# Patient Record
Sex: Male | Born: 1935 | Race: White | Hispanic: No | Smoking: Former smoker
Health system: Southern US, Community
[De-identification: ages and names within clinical notes are randomized; demographics above are authoritative.]

## PROBLEM LIST (undated history)

## (undated) DIAGNOSIS — I509 Heart failure, unspecified: Secondary | ICD-10-CM

## (undated) DIAGNOSIS — N189 Chronic kidney disease, unspecified: Secondary | ICD-10-CM

## (undated) DIAGNOSIS — D649 Anemia, unspecified: Secondary | ICD-10-CM

## (undated) DIAGNOSIS — E119 Type 2 diabetes mellitus without complications: Secondary | ICD-10-CM

## (undated) DIAGNOSIS — H409 Unspecified glaucoma: Secondary | ICD-10-CM

## (undated) DIAGNOSIS — I4891 Unspecified atrial fibrillation: Secondary | ICD-10-CM

## (undated) DIAGNOSIS — E785 Hyperlipidemia, unspecified: Secondary | ICD-10-CM

## (undated) DIAGNOSIS — K219 Gastro-esophageal reflux disease without esophagitis: Secondary | ICD-10-CM

## (undated) DIAGNOSIS — I10 Essential (primary) hypertension: Secondary | ICD-10-CM

## (undated) DIAGNOSIS — C679 Malignant neoplasm of bladder, unspecified: Secondary | ICD-10-CM

## (undated) DIAGNOSIS — M1990 Unspecified osteoarthritis, unspecified site: Secondary | ICD-10-CM

## (undated) DIAGNOSIS — I2510 Atherosclerotic heart disease of native coronary artery without angina pectoris: Secondary | ICD-10-CM

## (undated) DIAGNOSIS — I501 Left ventricular failure: Secondary | ICD-10-CM

## (undated) DIAGNOSIS — G4730 Sleep apnea, unspecified: Secondary | ICD-10-CM

## (undated) HISTORY — DX: Atherosclerotic heart disease of native coronary artery without angina pectoris: I25.10

## (undated) HISTORY — PX: AORTIC VALVE REPLACEMENT: SHX41

## (undated) HISTORY — PX: KNEE ARTHROSCOPY: SUR90

## (undated) HISTORY — DX: Unspecified glaucoma: H40.9

## (undated) HISTORY — DX: Type 2 diabetes mellitus without complications: E11.9

## (undated) HISTORY — PX: OTHER SURGICAL HISTORY: SHX169

## (undated) HISTORY — DX: Left ventricular failure, unspecified: I50.1

## (undated) HISTORY — DX: Gastro-esophageal reflux disease without esophagitis: K21.9

## (undated) HISTORY — DX: Anemia, unspecified: D64.9

## (undated) HISTORY — DX: Sleep apnea, unspecified: G47.30

## (undated) HISTORY — DX: Unspecified osteoarthritis, unspecified site: M19.90

## (undated) HISTORY — DX: Malignant neoplasm of bladder, unspecified: C67.9

## (undated) HISTORY — DX: Hyperlipidemia, unspecified: E78.5

## (undated) HISTORY — DX: Essential (primary) hypertension: I10

## (undated) HISTORY — DX: Heart failure, unspecified: I50.9

## (undated) HISTORY — DX: Unspecified atrial fibrillation: I48.91

## (undated) HISTORY — DX: Chronic kidney disease, unspecified: N18.9

---

## 2004-08-11 ENCOUNTER — Ambulatory Visit: Payer: Self-pay | Admitting: Unknown Physician Specialty

## 2004-08-26 ENCOUNTER — Ambulatory Visit: Payer: Self-pay | Admitting: Unknown Physician Specialty

## 2005-07-11 ENCOUNTER — Ambulatory Visit: Payer: Self-pay | Admitting: Family Medicine

## 2005-08-01 ENCOUNTER — Other Ambulatory Visit: Payer: Self-pay

## 2005-08-11 ENCOUNTER — Inpatient Hospital Stay: Payer: Self-pay | Admitting: General Practice

## 2005-08-16 ENCOUNTER — Ambulatory Visit: Payer: Self-pay | Admitting: Family Medicine

## 2005-09-05 ENCOUNTER — Ambulatory Visit: Payer: Self-pay | Admitting: Family Medicine

## 2005-09-14 ENCOUNTER — Other Ambulatory Visit: Payer: Self-pay

## 2005-09-14 ENCOUNTER — Emergency Department: Payer: Self-pay | Admitting: Emergency Medicine

## 2006-11-30 ENCOUNTER — Ambulatory Visit: Payer: Self-pay | Admitting: General Practice

## 2007-01-22 ENCOUNTER — Ambulatory Visit: Payer: Self-pay | Admitting: Pain Medicine

## 2007-02-06 ENCOUNTER — Ambulatory Visit: Payer: Self-pay | Admitting: Pain Medicine

## 2007-03-19 ENCOUNTER — Ambulatory Visit: Payer: Self-pay | Admitting: Pain Medicine

## 2007-04-01 ENCOUNTER — Ambulatory Visit: Payer: Self-pay | Admitting: Pain Medicine

## 2007-05-30 ENCOUNTER — Ambulatory Visit: Payer: Self-pay | Admitting: Pain Medicine

## 2007-06-19 ENCOUNTER — Ambulatory Visit: Payer: Self-pay | Admitting: Pain Medicine

## 2007-07-25 ENCOUNTER — Ambulatory Visit: Payer: Self-pay | Admitting: Family Medicine

## 2007-08-08 ENCOUNTER — Ambulatory Visit: Payer: Self-pay | Admitting: Family Medicine

## 2007-10-09 ENCOUNTER — Encounter: Payer: Self-pay | Admitting: Cardiology

## 2007-11-06 ENCOUNTER — Encounter: Payer: Self-pay | Admitting: Cardiology

## 2007-12-06 ENCOUNTER — Encounter: Payer: Self-pay | Admitting: Cardiology

## 2008-01-06 ENCOUNTER — Encounter: Payer: Self-pay | Admitting: Cardiology

## 2008-05-27 ENCOUNTER — Ambulatory Visit: Payer: Self-pay | Admitting: Family Medicine

## 2008-08-18 ENCOUNTER — Emergency Department: Payer: Self-pay | Admitting: Unknown Physician Specialty

## 2008-11-28 ENCOUNTER — Inpatient Hospital Stay: Payer: Self-pay | Admitting: *Deleted

## 2009-01-29 ENCOUNTER — Emergency Department: Payer: Self-pay | Admitting: Internal Medicine

## 2009-02-02 ENCOUNTER — Ambulatory Visit: Payer: Self-pay

## 2009-02-11 ENCOUNTER — Ambulatory Visit: Payer: Self-pay | Admitting: General Practice

## 2009-03-17 ENCOUNTER — Ambulatory Visit: Payer: Self-pay | Admitting: Cardiovascular Disease

## 2009-03-17 ENCOUNTER — Ambulatory Visit: Payer: Self-pay | Admitting: General Practice

## 2009-03-31 ENCOUNTER — Inpatient Hospital Stay: Payer: Self-pay | Admitting: General Practice

## 2009-05-26 ENCOUNTER — Ambulatory Visit: Payer: Self-pay | Admitting: General Practice

## 2009-06-09 ENCOUNTER — Inpatient Hospital Stay: Payer: Self-pay | Admitting: *Deleted

## 2009-06-23 ENCOUNTER — Ambulatory Visit: Payer: Self-pay | Admitting: General Practice

## 2009-06-28 ENCOUNTER — Inpatient Hospital Stay: Payer: Self-pay | Admitting: General Practice

## 2009-10-05 ENCOUNTER — Ambulatory Visit: Payer: Self-pay | Admitting: Oncology

## 2009-10-14 ENCOUNTER — Ambulatory Visit: Payer: Self-pay | Admitting: Oncology

## 2009-11-05 ENCOUNTER — Ambulatory Visit: Payer: Self-pay | Admitting: Oncology

## 2009-12-05 ENCOUNTER — Ambulatory Visit: Payer: Self-pay | Admitting: Oncology

## 2009-12-14 ENCOUNTER — Ambulatory Visit: Payer: Self-pay | Admitting: Oncology

## 2010-01-05 ENCOUNTER — Ambulatory Visit: Payer: Self-pay | Admitting: Oncology

## 2010-01-13 ENCOUNTER — Ambulatory Visit: Payer: Self-pay | Admitting: Pain Medicine

## 2010-01-24 ENCOUNTER — Inpatient Hospital Stay: Payer: Self-pay | Admitting: Cardiology

## 2010-02-17 ENCOUNTER — Ambulatory Visit: Payer: Self-pay | Admitting: Pain Medicine

## 2010-02-21 ENCOUNTER — Ambulatory Visit: Payer: Self-pay | Admitting: Pain Medicine

## 2010-03-16 ENCOUNTER — Ambulatory Visit: Payer: Self-pay | Admitting: Oncology

## 2010-03-22 ENCOUNTER — Ambulatory Visit: Payer: Self-pay | Admitting: Pain Medicine

## 2010-03-28 ENCOUNTER — Ambulatory Visit: Payer: Self-pay | Admitting: Pain Medicine

## 2010-04-07 ENCOUNTER — Ambulatory Visit: Payer: Self-pay | Admitting: Oncology

## 2010-04-12 IMAGING — CR DG KNEE 1-2V*R*
1 series · 2 of 2 positions shown · non-contrast
Comparison: none

REASON FOR EXAM: postop
COMMENTS:   Bedside (portable):Y

PROCEDURE:     DXR - DXR KNEE RIGHT AP AND LATERAL  - March 31, 2009  [DATE]
RESULT:     The patient is status post right knee arthroplasty. The hardware
is different than seen on the study of 01/29/2009. Surgical drains and skin
staples are present. Methylmethacrylate is present.

[Series 1: view not recorded · 0.17mm/px · 2 of 2 slices shown]
[im 1/2]
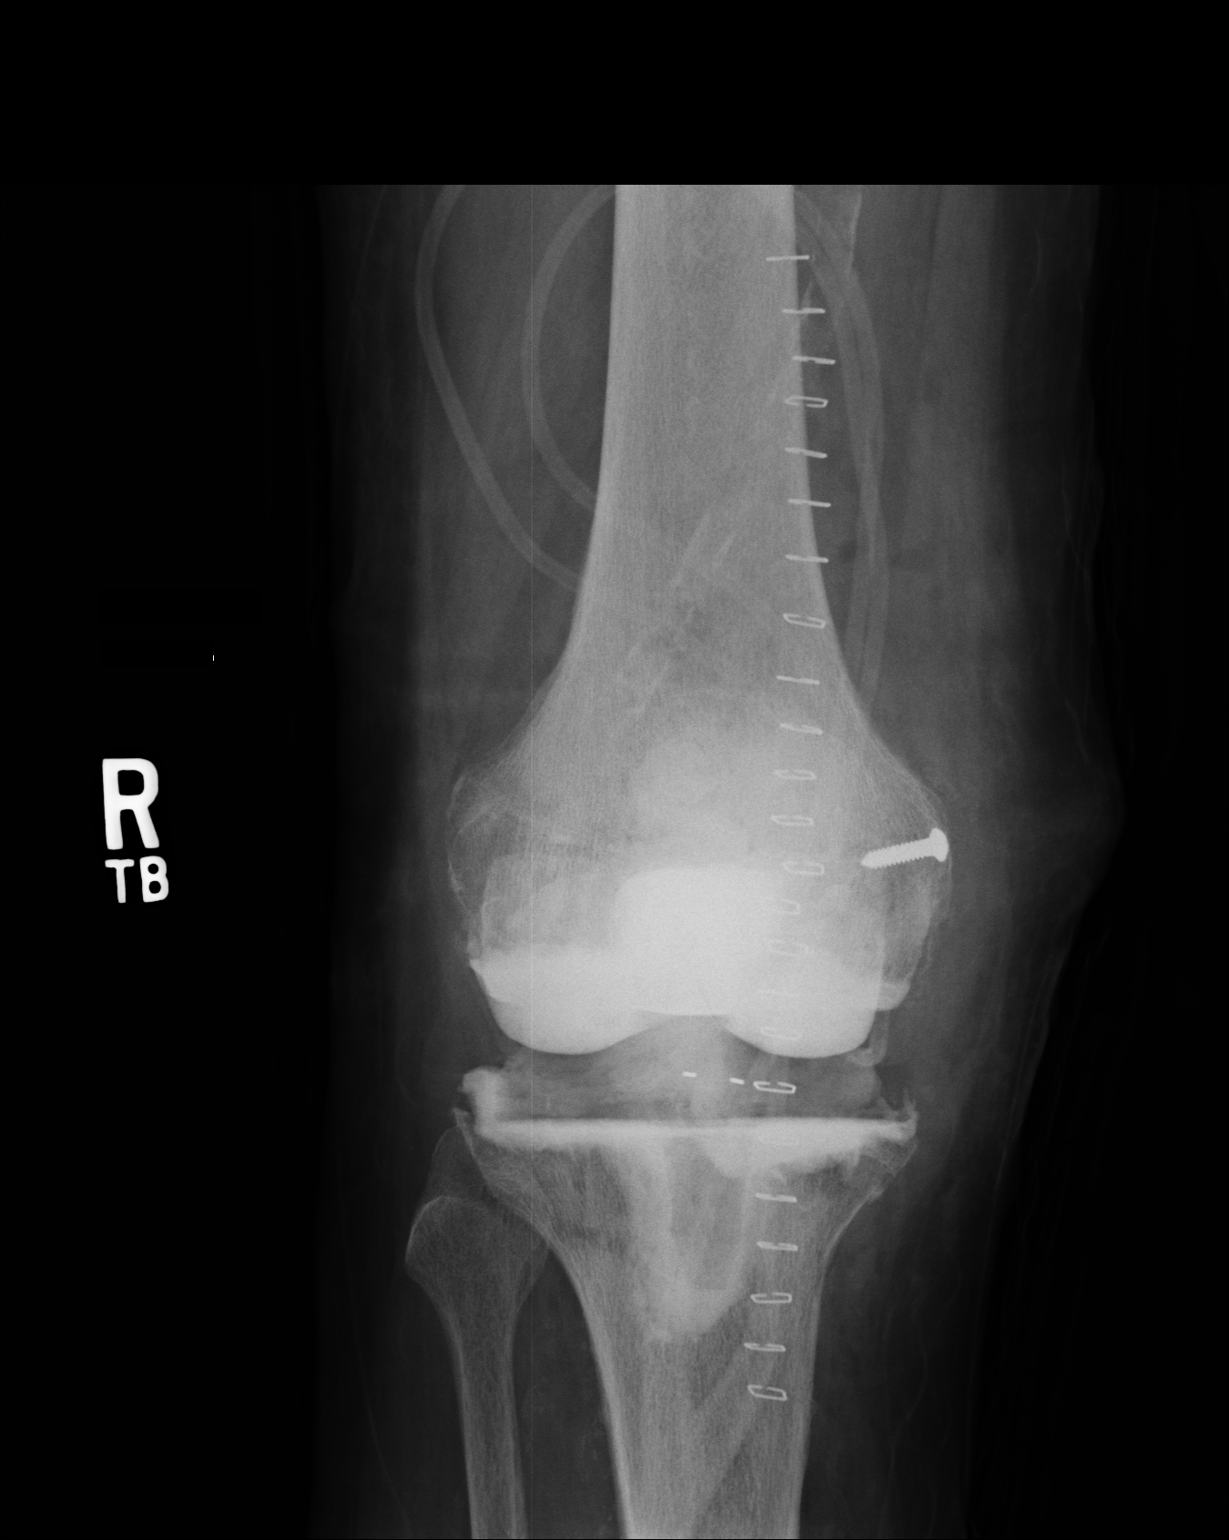
[im 2/2]
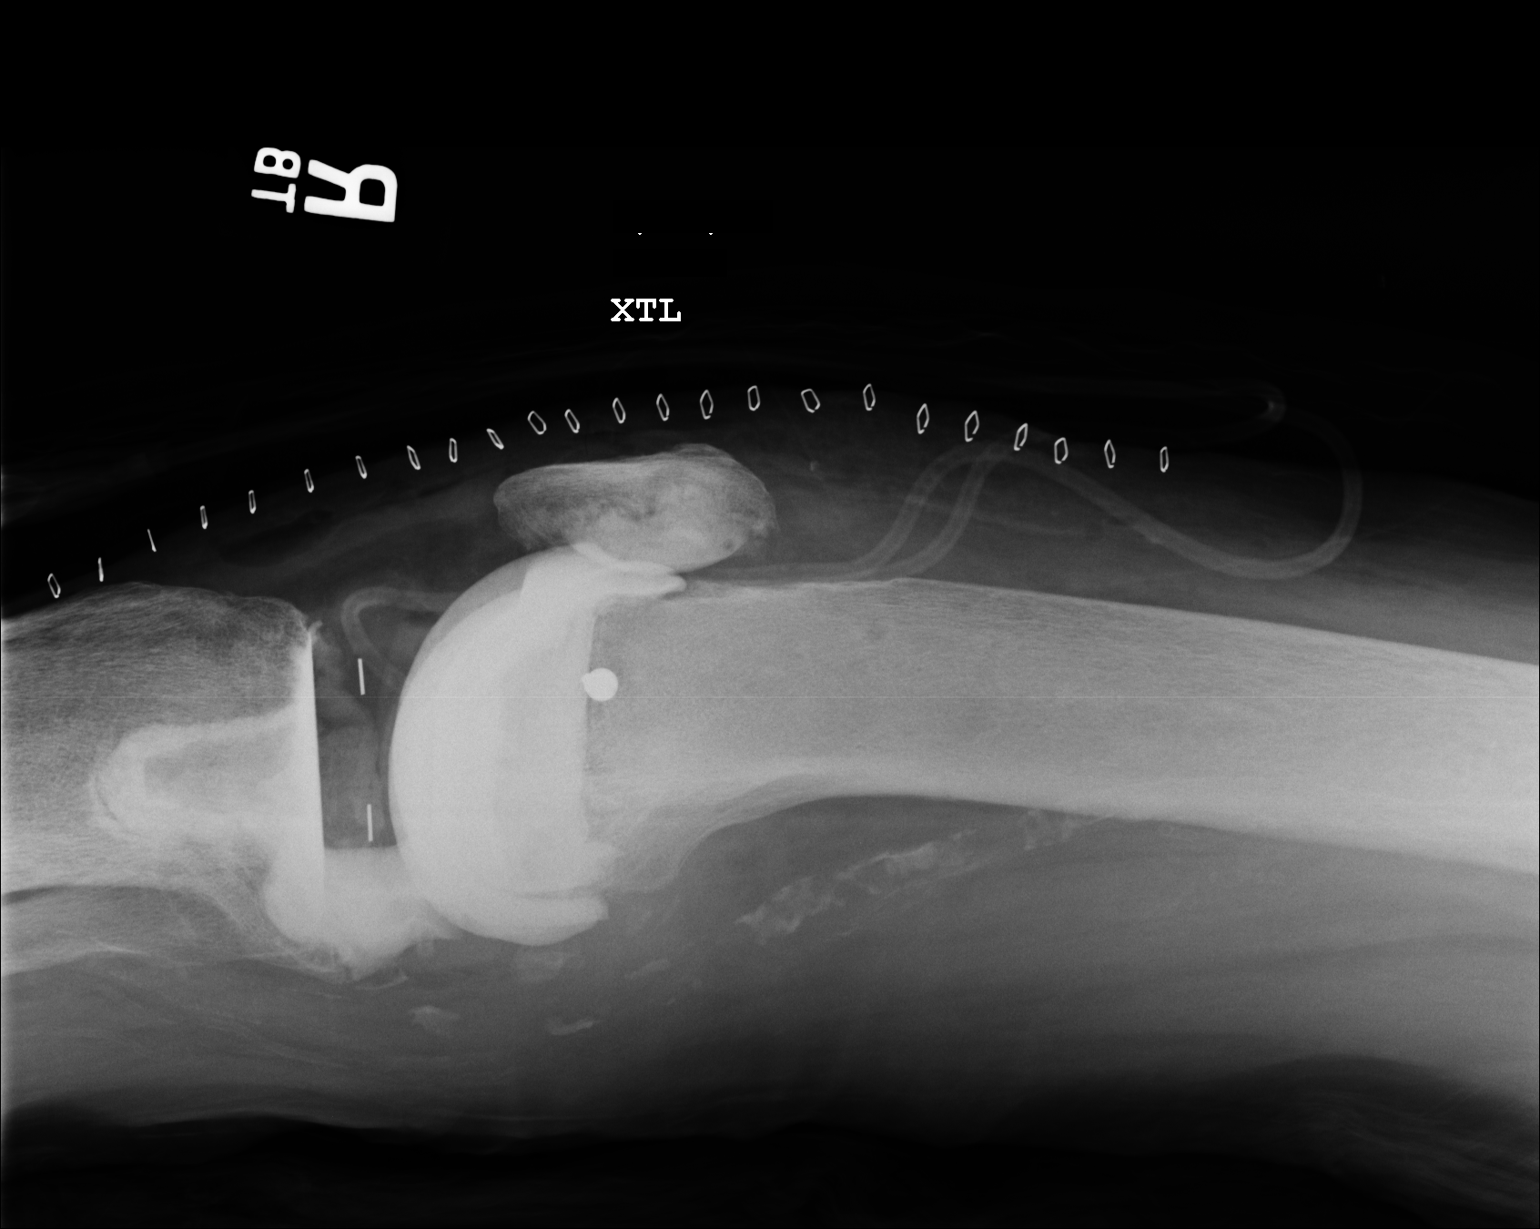

[2 of 2 positions shown; findings below may reference images not displayed]

IMPRESSION: Postoperative changes as described. Hardware revision has
occurred.

## 2010-04-28 ENCOUNTER — Ambulatory Visit: Payer: Self-pay | Admitting: Pain Medicine

## 2010-05-02 ENCOUNTER — Ambulatory Visit: Payer: Self-pay | Admitting: Pain Medicine

## 2010-05-09 ENCOUNTER — Ambulatory Visit: Payer: Self-pay | Admitting: Pain Medicine

## 2010-06-09 ENCOUNTER — Ambulatory Visit: Payer: Self-pay | Admitting: Pain Medicine

## 2010-06-16 ENCOUNTER — Ambulatory Visit: Payer: Self-pay | Admitting: Oncology

## 2010-06-20 ENCOUNTER — Other Ambulatory Visit: Payer: Self-pay | Admitting: Pain Medicine

## 2010-06-20 ENCOUNTER — Ambulatory Visit: Payer: Self-pay | Admitting: Pain Medicine

## 2010-07-07 ENCOUNTER — Ambulatory Visit: Payer: Self-pay | Admitting: Oncology

## 2010-07-10 IMAGING — CR DG KNEE 1-2V*R*
1 series · 5 of 5 positions shown · non-contrast
Comparison: none

REASON FOR EXAM: postop
COMMENTS:   Bedside (portable):Y

PROCEDURE:     DXR - DXR KNEE RIGHT AP AND LATERAL  - June 28, 2009  [DATE]
RESULT:     Comparison: 03/31/2009

[Series 1: view not recorded · 0.17mm/px · 5 of 5 slices shown]
[im 1/5]
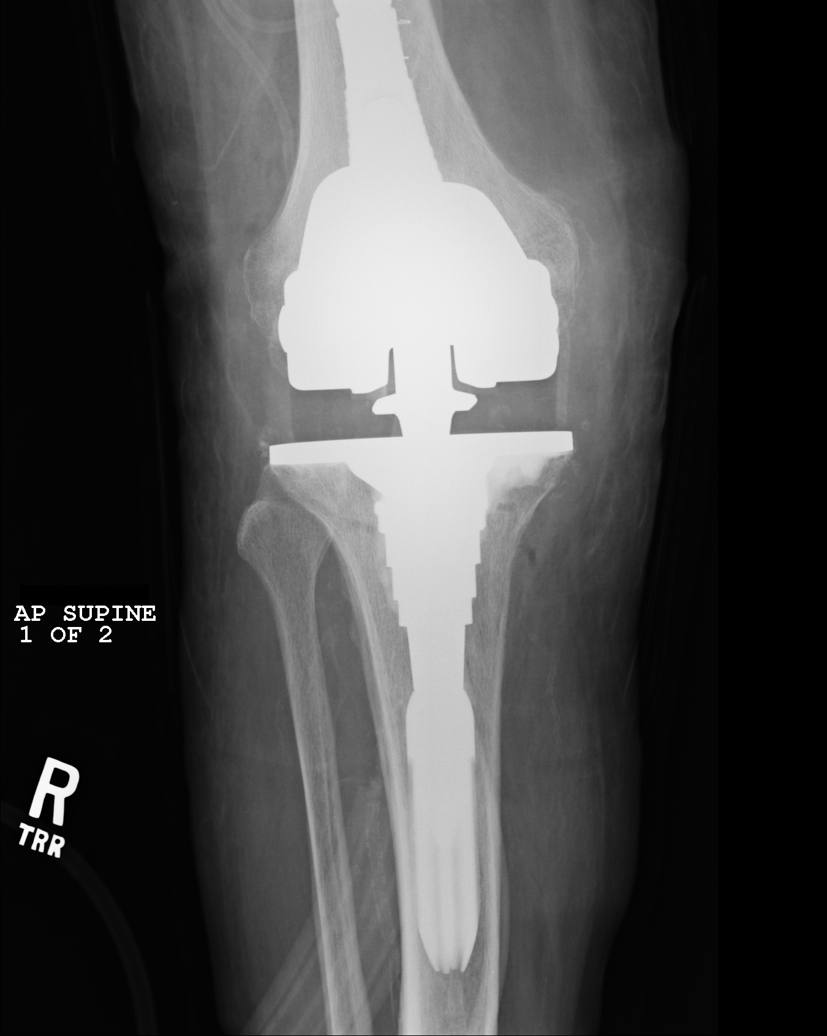
[im 2/5]
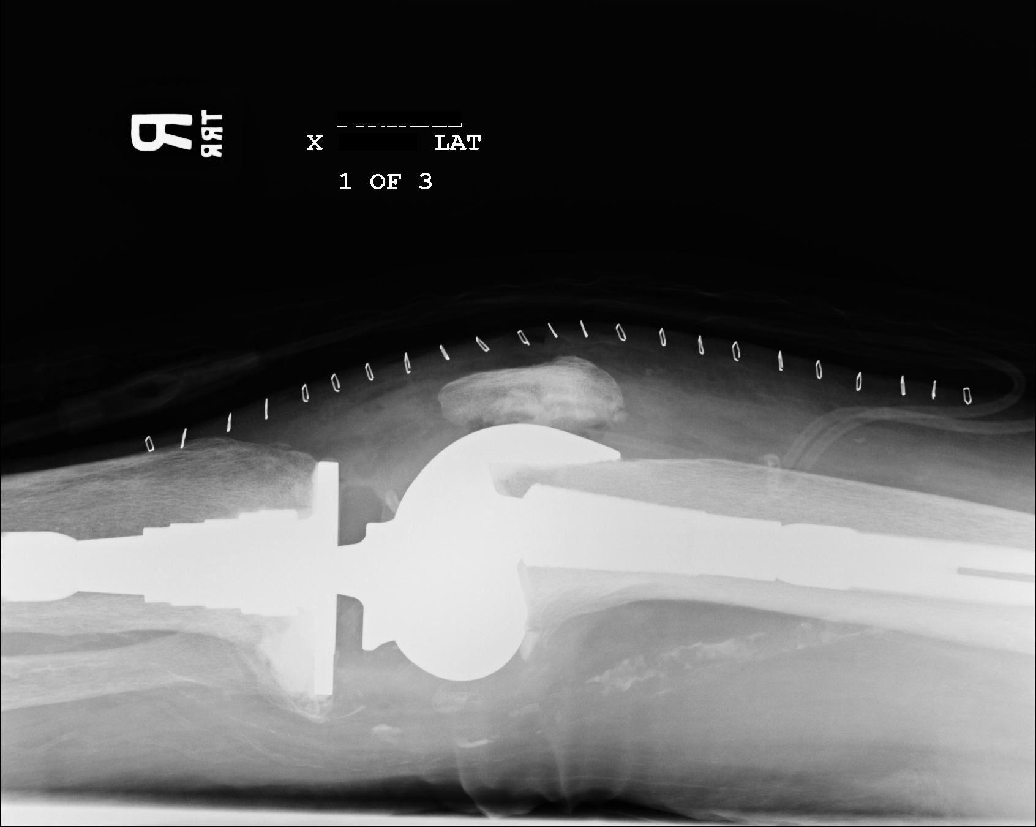
[im 3/5]
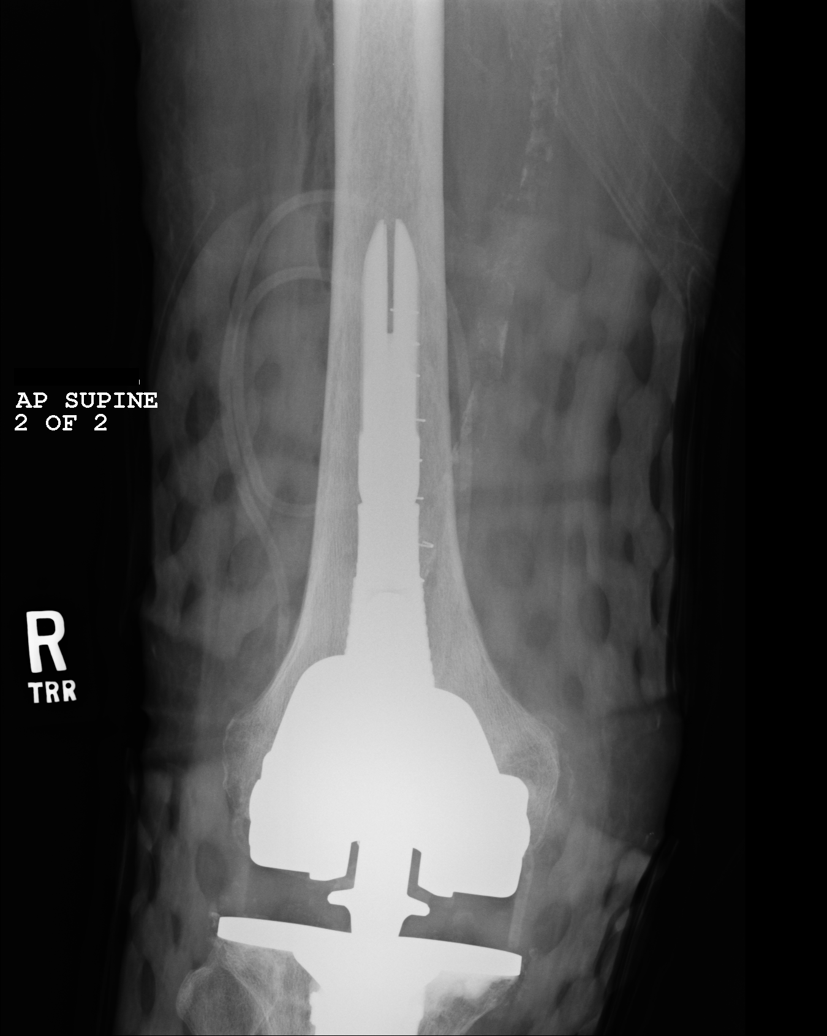
[im 4/5]
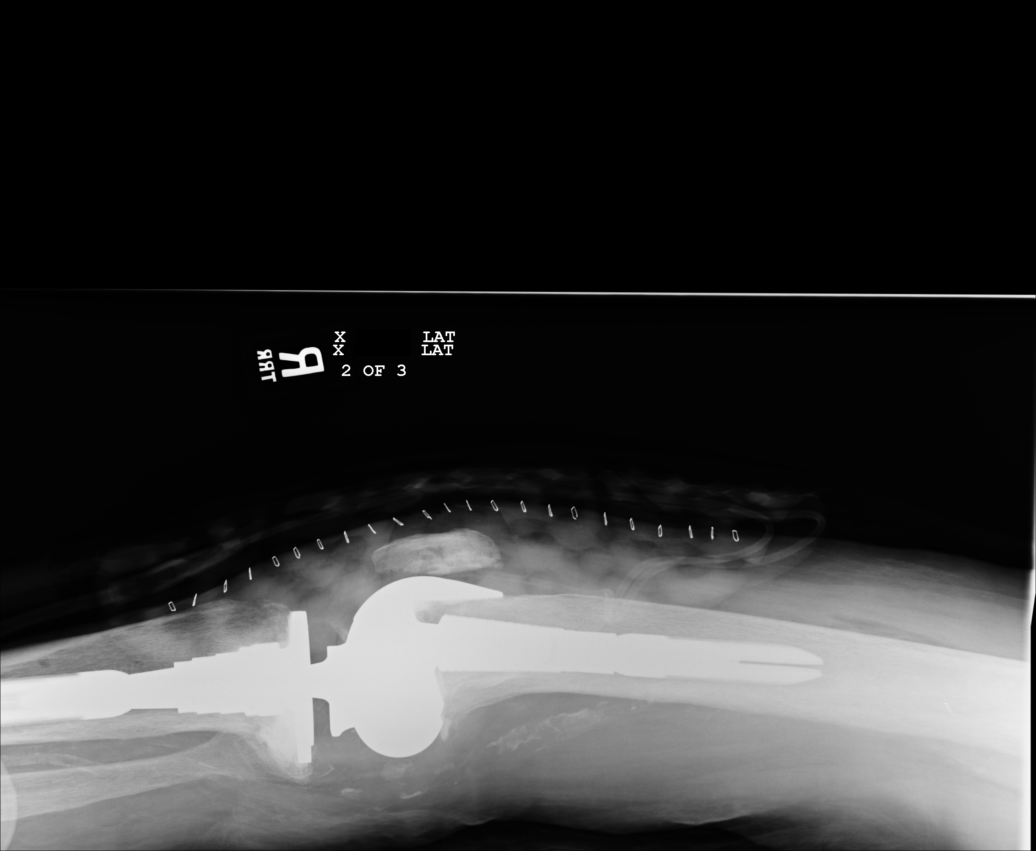
[im 5/5]
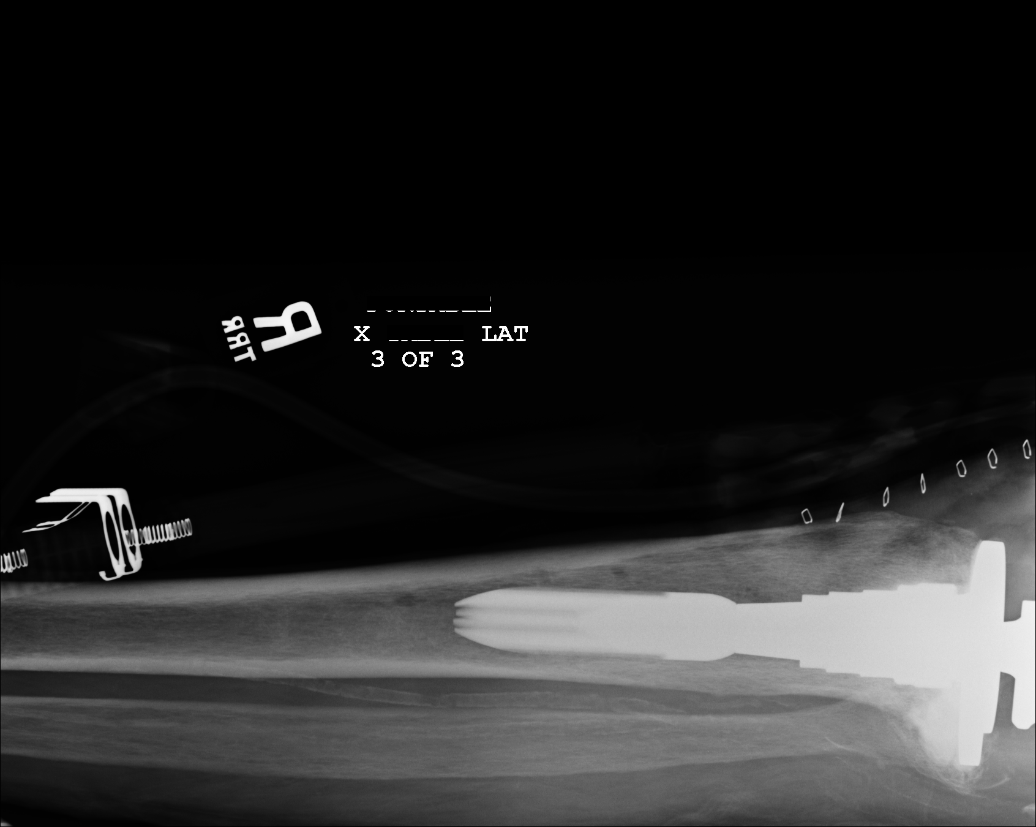

[5 of 5 positions shown; findings below may reference images not displayed]

FINDINGS: Five views of the right knee demonstrates a revision right total knee
arthroplasty without hardware failure or complication. There is no fracture
or dislocation. The alignment is anatomic. There is no significant joint
effusion. There are postsurgical changes within the anterior knee soft
tissues.
IMPRESSION: Revision right knee arthroplasty.

## 2010-07-26 ENCOUNTER — Ambulatory Visit: Payer: Self-pay | Admitting: Pain Medicine

## 2010-08-30 ENCOUNTER — Ambulatory Visit: Payer: Self-pay | Admitting: Pain Medicine

## 2010-09-15 ENCOUNTER — Ambulatory Visit: Payer: Self-pay | Admitting: Oncology

## 2010-09-29 ENCOUNTER — Ambulatory Visit: Payer: Self-pay | Admitting: Pain Medicine

## 2010-10-06 ENCOUNTER — Ambulatory Visit: Payer: Self-pay | Admitting: Oncology

## 2010-10-12 ENCOUNTER — Ambulatory Visit: Payer: Self-pay | Admitting: Pain Medicine

## 2010-10-12 ENCOUNTER — Other Ambulatory Visit: Payer: Self-pay | Admitting: Pain Medicine

## 2010-11-15 ENCOUNTER — Ambulatory Visit: Payer: Self-pay | Admitting: Pain Medicine

## 2010-12-15 ENCOUNTER — Ambulatory Visit: Payer: Self-pay | Admitting: Pain Medicine

## 2010-12-15 ENCOUNTER — Ambulatory Visit: Payer: Self-pay | Admitting: Oncology

## 2011-01-06 ENCOUNTER — Ambulatory Visit: Payer: Self-pay | Admitting: Oncology

## 2011-01-12 ENCOUNTER — Ambulatory Visit: Payer: Self-pay | Admitting: Pain Medicine

## 2011-01-25 ENCOUNTER — Ambulatory Visit: Payer: Self-pay | Admitting: Pain Medicine

## 2011-03-22 ENCOUNTER — Ambulatory Visit: Payer: Self-pay | Admitting: Oncology

## 2011-04-08 ENCOUNTER — Ambulatory Visit: Payer: Self-pay | Admitting: Oncology

## 2011-04-26 ENCOUNTER — Observation Stay: Payer: Self-pay | Admitting: Internal Medicine

## 2011-04-27 ENCOUNTER — Ambulatory Visit: Payer: Self-pay | Admitting: Cardiology

## 2011-06-23 ENCOUNTER — Ambulatory Visit: Payer: Self-pay | Admitting: Oncology

## 2011-07-08 ENCOUNTER — Ambulatory Visit: Payer: Self-pay | Admitting: Oncology

## 2011-09-13 ENCOUNTER — Ambulatory Visit: Payer: Self-pay | Admitting: Oncology

## 2011-09-15 LAB — CBC CANCER CENTER
Basophil %: 0.4 %
Eosinophil #: 0.1 x10 3/mm (ref 0.0–0.7)
HGB: 11.7 g/dL — ABNORMAL LOW (ref 13.0–18.0)
Lymphocyte %: 30.9 %
MCH: 33.1 pg (ref 26.0–34.0)
MCHC: 33.7 g/dL (ref 32.0–36.0)
MCV: 98 fL (ref 80–100)
Neutrophil %: 51.9 %
Platelet: 122 x10 3/mm — ABNORMAL LOW (ref 150–440)
RBC: 3.53 10*6/uL — ABNORMAL LOW (ref 4.40–5.90)
WBC: 4.1 x10 3/mm (ref 3.8–10.6)

## 2011-09-15 LAB — FERRITIN: Ferritin (ARMC): 212 ng/mL (ref 8–388)

## 2011-09-15 LAB — IRON AND TIBC: Iron Saturation: 34 %

## 2011-10-06 ENCOUNTER — Ambulatory Visit: Payer: Self-pay | Admitting: Oncology

## 2011-12-08 ENCOUNTER — Ambulatory Visit: Payer: Self-pay | Admitting: Oncology

## 2011-12-08 LAB — CBC CANCER CENTER
HCT: 34.5 % — ABNORMAL LOW (ref 40.0–52.0)
HGB: 11.3 g/dL — ABNORMAL LOW (ref 13.0–18.0)
MCH: 33.6 pg (ref 26.0–34.0)
MCHC: 32.6 g/dL (ref 32.0–36.0)
Monocyte #: 0.6 x10 3/mm (ref 0.2–1.0)
Monocyte %: 13 %
Neutrophil #: 2.4 x10 3/mm (ref 1.4–6.5)
Platelet: 128 x10 3/mm — ABNORMAL LOW (ref 150–440)
RBC: 3.36 10*6/uL — ABNORMAL LOW (ref 4.40–5.90)

## 2012-01-06 ENCOUNTER — Ambulatory Visit: Payer: Self-pay | Admitting: Oncology

## 2012-06-06 ENCOUNTER — Ambulatory Visit: Payer: Self-pay | Admitting: Unknown Physician Specialty

## 2012-06-21 ENCOUNTER — Ambulatory Visit: Payer: Self-pay | Admitting: Oncology

## 2012-06-21 LAB — CBC CANCER CENTER
Basophil #: 0 x10 3/mm (ref 0.0–0.1)
Basophil %: 0.3 %
Eosinophil #: 0.2 x10 3/mm (ref 0.0–0.7)
HCT: 33.2 % — ABNORMAL LOW (ref 40.0–52.0)
HGB: 10.7 g/dL — ABNORMAL LOW (ref 13.0–18.0)
Lymphocyte #: 1.2 x10 3/mm (ref 1.0–3.6)
MCHC: 32.4 g/dL (ref 32.0–36.0)
MCV: 102 fL — ABNORMAL HIGH (ref 80–100)
Monocyte %: 12.5 %
Neutrophil #: 2.6 x10 3/mm (ref 1.4–6.5)
RDW: 14.2 % (ref 11.5–14.5)

## 2012-07-07 ENCOUNTER — Ambulatory Visit: Payer: Self-pay | Admitting: Oncology

## 2012-12-18 ENCOUNTER — Ambulatory Visit: Payer: Self-pay | Admitting: Oncology

## 2013-02-10 ENCOUNTER — Ambulatory Visit: Payer: Self-pay | Admitting: Oncology

## 2013-02-11 LAB — CBC CANCER CENTER
HCT: 28.8 % — ABNORMAL LOW (ref 40.0–52.0)
HGB: 9.7 g/dL — ABNORMAL LOW (ref 13.0–18.0)
Lymphocyte #: 0.8 x10 3/mm — ABNORMAL LOW (ref 1.0–3.6)
Lymphocyte %: 19.9 %
MCHC: 33.7 g/dL (ref 32.0–36.0)
MCV: 101 fL — ABNORMAL HIGH (ref 80–100)
Monocyte #: 0.6 x10 3/mm (ref 0.2–1.0)
Monocyte %: 13.6 %
Neutrophil #: 2.6 x10 3/mm (ref 1.4–6.5)
Neutrophil %: 63.1 %
Platelet: 93 x10 3/mm — ABNORMAL LOW (ref 150–440)
RDW: 15.4 % — ABNORMAL HIGH (ref 11.5–14.5)

## 2013-02-24 LAB — IRON AND TIBC
Iron: 91 ug/dL (ref 65–175)
Unbound Iron-Bind.Cap.: 232 ug/dL

## 2013-03-04 ENCOUNTER — Inpatient Hospital Stay: Payer: Self-pay | Admitting: Internal Medicine

## 2013-03-04 LAB — COMPREHENSIVE METABOLIC PANEL
Albumin: 3.8 g/dL (ref 3.4–5.0)
Alkaline Phosphatase: 83 U/L (ref 50–136)
BUN: 42 mg/dL — ABNORMAL HIGH (ref 7–18)
Bilirubin,Total: 0.8 mg/dL (ref 0.2–1.0)
Chloride: 106 mmol/L (ref 98–107)
Creatinine: 2.1 mg/dL — ABNORMAL HIGH (ref 0.60–1.30)
EGFR (Non-African Amer.): 27 — ABNORMAL LOW
Glucose: 119 mg/dL — ABNORMAL HIGH (ref 65–99)
Osmolality: 287 (ref 275–301)
Potassium: 4.5 mmol/L (ref 3.5–5.1)
SGOT(AST): 27 U/L (ref 15–37)
SGPT (ALT): 20 U/L (ref 12–78)
Total Protein: 8 g/dL (ref 6.4–8.2)

## 2013-03-04 LAB — CK TOTAL AND CKMB (NOT AT ARMC)
CK, Total: 86 U/L (ref 35–232)
CK-MB: 0.6 ng/mL (ref 0.5–3.6)

## 2013-03-04 LAB — URINALYSIS, COMPLETE
Bacteria: NONE SEEN
Leukocyte Esterase: NEGATIVE
Nitrite: NEGATIVE
Protein: 30
Squamous Epithelial: <1
WBC UR: NONE SEEN /HPF (ref 0–5)

## 2013-03-04 LAB — CBC
HCT: 30.2 % — ABNORMAL LOW (ref 40.0–52.0)
HGB: 9.8 g/dL — ABNORMAL LOW (ref 13.0–18.0)
MCH: 32.5 pg (ref 26.0–34.0)
MCV: 100 fL (ref 80–100)
RBC: 3.03 10*6/uL — ABNORMAL LOW (ref 4.40–5.90)
RDW: 15.1 % — ABNORMAL HIGH (ref 11.5–14.5)

## 2013-03-04 LAB — TROPONIN I
Troponin-I: 0.12 ng/mL — ABNORMAL HIGH
Troponin-I: 0.13 ng/mL — ABNORMAL HIGH

## 2013-03-04 LAB — MAGNESIUM: Magnesium: 1.6 mg/dL — ABNORMAL LOW

## 2013-03-04 LAB — PROTIME-INR: Prothrombin Time: 24.1 secs — ABNORMAL HIGH (ref 11.5–14.7)

## 2013-03-05 LAB — BASIC METABOLIC PANEL
BUN: 38 mg/dL — ABNORMAL HIGH (ref 7–18)
Chloride: 108 mmol/L — ABNORMAL HIGH (ref 98–107)
Co2: 26 mmol/L (ref 21–32)
EGFR (African American): 35 — ABNORMAL LOW
EGFR (Non-African Amer.): 30 — ABNORMAL LOW
Glucose: 88 mg/dL (ref 65–99)

## 2013-03-05 LAB — CBC WITH DIFFERENTIAL/PLATELET
Basophil #: 0 10*3/uL (ref 0.0–0.1)
Basophil %: 0.3 %
Eosinophil #: 0.1 10*3/uL (ref 0.0–0.7)
HCT: 27.9 % — ABNORMAL LOW (ref 40.0–52.0)
HGB: 9.5 g/dL — ABNORMAL LOW (ref 13.0–18.0)
Lymphocyte #: 0.9 10*3/uL — ABNORMAL LOW (ref 1.0–3.6)
Lymphocyte %: 24.5 %
MCH: 33.9 pg (ref 26.0–34.0)
Neutrophil #: 2.1 10*3/uL (ref 1.4–6.5)
Neutrophil %: 58.6 %
Platelet: 70 10*3/uL — ABNORMAL LOW (ref 150–440)
RBC: 2.79 10*6/uL — ABNORMAL LOW (ref 4.40–5.90)
RDW: 15.3 % — ABNORMAL HIGH (ref 11.5–14.5)
WBC: 3.7 10*3/uL — ABNORMAL LOW (ref 3.8–10.6)

## 2013-03-05 LAB — PROTIME-INR
INR: 2.4
Prothrombin Time: 25.5 secs — ABNORMAL HIGH (ref 11.5–14.7)

## 2013-03-05 LAB — CK TOTAL AND CKMB (NOT AT ARMC): CK-MB: 0.9 ng/mL (ref 0.5–3.6)

## 2013-03-07 ENCOUNTER — Ambulatory Visit: Payer: Self-pay | Admitting: Oncology

## 2013-04-14 ENCOUNTER — Ambulatory Visit: Payer: Self-pay | Admitting: Oncology

## 2013-04-14 LAB — CBC CANCER CENTER
Basophil #: 0 x10 3/mm (ref 0.0–0.1)
Eosinophil #: 0.1 x10 3/mm (ref 0.0–0.7)
Eosinophil %: 1.7 %
HCT: 35.3 % — ABNORMAL LOW (ref 40.0–52.0)
Lymphocyte #: 1.4 x10 3/mm (ref 1.0–3.6)
Lymphocyte %: 32.1 %
MCH: 33.3 pg (ref 26.0–34.0)
MCHC: 33.3 g/dL (ref 32.0–36.0)
Monocyte #: 0.6 x10 3/mm (ref 0.2–1.0)
Monocyte %: 14.3 %
Neutrophil #: 2.3 x10 3/mm (ref 1.4–6.5)
Neutrophil %: 51.5 %
Platelet: 118 x10 3/mm — ABNORMAL LOW (ref 150–440)

## 2013-04-14 LAB — IRON AND TIBC
Iron Bind.Cap.(Total): 335 ug/dL (ref 250–450)
Iron: 90 ug/dL (ref 65–175)

## 2013-04-14 LAB — BASIC METABOLIC PANEL
BUN: 52 mg/dL — ABNORMAL HIGH (ref 7–18)
Chloride: 103 mmol/L (ref 98–107)
Co2: 26 mmol/L (ref 21–32)
Glucose: 130 mg/dL — ABNORMAL HIGH (ref 65–99)
Osmolality: 295 (ref 275–301)
Potassium: 4.5 mmol/L (ref 3.5–5.1)
Sodium: 140 mmol/L (ref 136–145)

## 2013-04-14 LAB — FERRITIN: Ferritin (ARMC): 242 ng/mL (ref 8–388)

## 2013-05-07 ENCOUNTER — Ambulatory Visit: Payer: Self-pay | Admitting: Oncology

## 2013-07-24 ENCOUNTER — Ambulatory Visit: Payer: Self-pay | Admitting: Oncology

## 2013-07-24 LAB — CBC CANCER CENTER
Basophil #: 0 x10 3/mm (ref 0.0–0.1)
Basophil %: 0.5 %
Eosinophil %: 1 %
HGB: 10.4 g/dL — ABNORMAL LOW (ref 13.0–18.0)
Lymphocyte #: 1.5 x10 3/mm (ref 1.0–3.6)
Lymphocyte %: 27.3 %
MCH: 32.9 pg (ref 26.0–34.0)
MCV: 101 fL — ABNORMAL HIGH (ref 80–100)
Monocyte #: 0.6 x10 3/mm (ref 0.2–1.0)
Monocyte %: 11.7 %
Neutrophil #: 3.2 x10 3/mm (ref 1.4–6.5)
Platelet: 110 x10 3/mm — ABNORMAL LOW (ref 150–440)
RBC: 3.15 10*6/uL — ABNORMAL LOW (ref 4.40–5.90)
WBC: 5.4 x10 3/mm (ref 3.8–10.6)

## 2013-08-07 ENCOUNTER — Ambulatory Visit: Payer: Self-pay | Admitting: Oncology

## 2013-08-21 ENCOUNTER — Ambulatory Visit: Payer: Self-pay | Admitting: Physical Medicine and Rehabilitation

## 2013-10-20 ENCOUNTER — Ambulatory Visit: Payer: Self-pay | Admitting: Cardiology

## 2013-11-05 ENCOUNTER — Ambulatory Visit: Payer: Self-pay | Admitting: Cardiology

## 2013-12-11 ENCOUNTER — Ambulatory Visit: Payer: Self-pay | Admitting: Oncology

## 2013-12-11 LAB — CBC CANCER CENTER
BASOS PCT: 0.5 %
Basophil #: 0 x10 3/mm (ref 0.0–0.1)
EOS PCT: 1.6 %
Eosinophil #: 0.1 x10 3/mm (ref 0.0–0.7)
HCT: 32 % — ABNORMAL LOW (ref 40.0–52.0)
HGB: 10.6 g/dL — AB (ref 13.0–18.0)
LYMPHS ABS: 1.3 x10 3/mm (ref 1.0–3.6)
LYMPHS PCT: 26.8 %
MCH: 33.7 pg (ref 26.0–34.0)
MCHC: 33.1 g/dL (ref 32.0–36.0)
MCV: 102 fL — ABNORMAL HIGH (ref 80–100)
MONOS PCT: 10.8 %
Monocyte #: 0.5 x10 3/mm (ref 0.2–1.0)
Neutrophil #: 2.8 x10 3/mm (ref 1.4–6.5)
Neutrophil %: 60.3 %
Platelet: 97 x10 3/mm — ABNORMAL LOW (ref 150–440)
RBC: 3.14 10*6/uL — AB (ref 4.40–5.90)
RDW: 14.5 % (ref 11.5–14.5)
WBC: 4.7 x10 3/mm (ref 3.8–10.6)

## 2014-01-05 ENCOUNTER — Ambulatory Visit: Payer: Self-pay | Admitting: Oncology

## 2014-03-13 ENCOUNTER — Emergency Department: Payer: Self-pay | Admitting: Emergency Medicine

## 2014-05-07 ENCOUNTER — Inpatient Hospital Stay: Payer: Self-pay | Admitting: Internal Medicine

## 2014-05-07 ENCOUNTER — Ambulatory Visit: Payer: Self-pay | Admitting: Oncology

## 2014-05-07 LAB — COMPREHENSIVE METABOLIC PANEL
ALBUMIN: 3.4 g/dL (ref 3.4–5.0)
ANION GAP: 6 — AB (ref 7–16)
Alkaline Phosphatase: 93 U/L
BUN: 51 mg/dL — ABNORMAL HIGH (ref 7–18)
Bilirubin,Total: 0.5 mg/dL (ref 0.2–1.0)
CHLORIDE: 107 mmol/L (ref 98–107)
Calcium, Total: 9 mg/dL (ref 8.5–10.1)
Co2: 24 mmol/L (ref 21–32)
Creatinine: 2.12 mg/dL — ABNORMAL HIGH (ref 0.60–1.30)
EGFR (Non-African Amer.): 31 — ABNORMAL LOW
GFR CALC AF AMER: 38 — AB
GLUCOSE: 135 mg/dL — AB (ref 65–99)
OSMOLALITY: 290 (ref 275–301)
Potassium: 5.4 mmol/L — ABNORMAL HIGH (ref 3.5–5.1)
SGOT(AST): 29 U/L (ref 15–37)
SGPT (ALT): 29 U/L
Sodium: 137 mmol/L (ref 136–145)
Total Protein: 7.7 g/dL (ref 6.4–8.2)

## 2014-05-07 LAB — CBC
HCT: 30.6 % — ABNORMAL LOW (ref 40.0–52.0)
HGB: 9.8 g/dL — ABNORMAL LOW (ref 13.0–18.0)
MCH: 32.6 pg (ref 26.0–34.0)
MCHC: 32 g/dL (ref 32.0–36.0)
MCV: 102 fL — ABNORMAL HIGH (ref 80–100)
Platelet: 70 10*3/uL — ABNORMAL LOW (ref 150–440)
RBC: 3 10*6/uL — ABNORMAL LOW (ref 4.40–5.90)
RDW: 14.7 % — AB (ref 11.5–14.5)
WBC: 5.3 10*3/uL (ref 3.8–10.6)

## 2014-05-07 LAB — URINALYSIS, COMPLETE
BLOOD: NEGATIVE
Bacteria: NONE SEEN
Bilirubin,UR: NEGATIVE
GLUCOSE, UR: NEGATIVE mg/dL (ref 0–75)
KETONE: NEGATIVE
LEUKOCYTE ESTERASE: NEGATIVE
Nitrite: NEGATIVE
Ph: 5 (ref 4.5–8.0)
Protein: NEGATIVE
RBC,UR: <1 /HPF (ref 0–5)
SPECIFIC GRAVITY: 1.01 (ref 1.003–1.030)
SQUAMOUS EPITHELIAL: NONE SEEN
WBC UR: <1 /HPF (ref 0–5)

## 2014-05-07 LAB — PROTIME-INR
INR: 2.6
Prothrombin Time: 26.9 secs — ABNORMAL HIGH (ref 11.5–14.7)

## 2014-05-08 LAB — CBC WITH DIFFERENTIAL/PLATELET
Basophil #: 0 10*3/uL (ref 0.0–0.1)
Basophil %: 0.3 %
EOS ABS: 0.1 10*3/uL (ref 0.0–0.7)
Eosinophil %: 1.7 %
HCT: 29.4 % — AB (ref 40.0–52.0)
HGB: 9.2 g/dL — AB (ref 13.0–18.0)
LYMPHS ABS: 0.9 10*3/uL — AB (ref 1.0–3.6)
Lymphocyte %: 17.1 %
MCH: 32.3 pg (ref 26.0–34.0)
MCHC: 31.3 g/dL — ABNORMAL LOW (ref 32.0–36.0)
MCV: 103 fL — ABNORMAL HIGH (ref 80–100)
Monocyte #: 0.8 x10 3/mm (ref 0.2–1.0)
Monocyte %: 14.1 %
Neutrophil #: 3.6 10*3/uL (ref 1.4–6.5)
Neutrophil %: 66.8 %
Platelet: 64 10*3/uL — ABNORMAL LOW (ref 150–440)
RBC: 2.85 10*6/uL — AB (ref 4.40–5.90)
RDW: 14.8 % — ABNORMAL HIGH (ref 11.5–14.5)
WBC: 5.4 10*3/uL (ref 3.8–10.6)

## 2014-05-08 LAB — BASIC METABOLIC PANEL
ANION GAP: 6 — AB (ref 7–16)
BUN: 40 mg/dL — ABNORMAL HIGH (ref 7–18)
CALCIUM: 8.8 mg/dL (ref 8.5–10.1)
Chloride: 110 mmol/L — ABNORMAL HIGH (ref 98–107)
Co2: 24 mmol/L (ref 21–32)
Creatinine: 2 mg/dL — ABNORMAL HIGH (ref 0.60–1.30)
EGFR (African American): 40 — ABNORMAL LOW
EGFR (Non-African Amer.): 33 — ABNORMAL LOW
Glucose: 109 mg/dL — ABNORMAL HIGH (ref 65–99)
OSMOLALITY: 290 (ref 275–301)
POTASSIUM: 4.8 mmol/L (ref 3.5–5.1)
SODIUM: 140 mmol/L (ref 136–145)

## 2014-05-08 LAB — PROTIME-INR
INR: 2.9
Prothrombin Time: 29.8 secs — ABNORMAL HIGH (ref 11.5–14.7)

## 2014-05-08 LAB — HEMOGLOBIN A1C: HEMOGLOBIN A1C: 6.6 % — AB (ref 4.2–6.3)

## 2014-05-08 LAB — TSH: Thyroid Stimulating Horm: 2.27 u[IU]/mL

## 2014-05-08 LAB — MAGNESIUM: MAGNESIUM: 1.7 mg/dL — AB

## 2014-05-09 LAB — PROTIME-INR
INR: 2.9
Prothrombin Time: 29.5 secs — ABNORMAL HIGH (ref 11.5–14.7)

## 2014-05-10 LAB — PROTIME-INR
INR: 1.8
Prothrombin Time: 20.9 secs — ABNORMAL HIGH (ref 11.5–14.7)

## 2014-05-11 LAB — CBC WITH DIFFERENTIAL/PLATELET
Basophil #: 0 10*3/uL (ref 0.0–0.1)
Basophil %: 0.3 %
Eosinophil #: 0 10*3/uL (ref 0.0–0.7)
Eosinophil %: 0.6 %
HCT: 26.3 % — AB (ref 40.0–52.0)
HGB: 8.3 g/dL — ABNORMAL LOW (ref 13.0–18.0)
Lymphocyte #: 0.5 10*3/uL — ABNORMAL LOW (ref 1.0–3.6)
Lymphocyte %: 7.6 %
MCH: 32.4 pg (ref 26.0–34.0)
MCHC: 31.7 g/dL — AB (ref 32.0–36.0)
MCV: 102 fL — AB (ref 80–100)
MONO ABS: 0.8 x10 3/mm (ref 0.2–1.0)
MONOS PCT: 11.7 %
NEUTROS ABS: 5.1 10*3/uL (ref 1.4–6.5)
Neutrophil %: 79.8 %
PLATELETS: 77 10*3/uL — AB (ref 150–440)
RBC: 2.57 10*6/uL — AB (ref 4.40–5.90)
RDW: 14.8 % — ABNORMAL HIGH (ref 11.5–14.5)
WBC: 6.4 10*3/uL (ref 3.8–10.6)

## 2014-05-11 LAB — BASIC METABOLIC PANEL
Anion Gap: 6 — ABNORMAL LOW (ref 7–16)
BUN: 64 mg/dL — AB (ref 7–18)
CHLORIDE: 105 mmol/L (ref 98–107)
CO2: 25 mmol/L (ref 21–32)
CREATININE: 2.43 mg/dL — AB (ref 0.60–1.30)
Calcium, Total: 8.8 mg/dL (ref 8.5–10.1)
EGFR (African American): 32 — ABNORMAL LOW
EGFR (Non-African Amer.): 27 — ABNORMAL LOW
Glucose: 132 mg/dL — ABNORMAL HIGH (ref 65–99)
Osmolality: 292 (ref 275–301)
POTASSIUM: 4.7 mmol/L (ref 3.5–5.1)
SODIUM: 136 mmol/L (ref 136–145)

## 2014-05-11 LAB — PROTIME-INR
INR: 1.5
Prothrombin Time: 18 secs — ABNORMAL HIGH (ref 11.5–14.7)

## 2014-05-12 LAB — CBC WITH DIFFERENTIAL/PLATELET
Basophil #: 0 10*3/uL (ref 0.0–0.1)
Basophil %: 0.2 %
EOS ABS: 0.1 10*3/uL (ref 0.0–0.7)
EOS PCT: 1.5 %
HCT: 23.9 % — ABNORMAL LOW (ref 40.0–52.0)
HGB: 7.6 g/dL — ABNORMAL LOW (ref 13.0–18.0)
LYMPHS ABS: 0.8 10*3/uL — AB (ref 1.0–3.6)
Lymphocyte %: 12.2 %
MCH: 32.6 pg (ref 26.0–34.0)
MCHC: 31.9 g/dL — ABNORMAL LOW (ref 32.0–36.0)
MCV: 102 fL — AB (ref 80–100)
MONOS PCT: 14.4 %
Monocyte #: 0.9 x10 3/mm (ref 0.2–1.0)
NEUTROS ABS: 4.7 10*3/uL (ref 1.4–6.5)
NEUTROS PCT: 71.7 %
PLATELETS: 91 10*3/uL — AB (ref 150–440)
RBC: 2.34 10*6/uL — AB (ref 4.40–5.90)
RDW: 14.3 % (ref 11.5–14.5)
WBC: 6.5 10*3/uL (ref 3.8–10.6)

## 2014-05-12 LAB — PROTIME-INR
INR: 1.5
Prothrombin Time: 17.8 secs — ABNORMAL HIGH (ref 11.5–14.7)

## 2014-05-12 LAB — BASIC METABOLIC PANEL
Anion Gap: 6 — ABNORMAL LOW (ref 7–16)
BUN: 73 mg/dL — AB (ref 7–18)
Calcium, Total: 8.2 mg/dL — ABNORMAL LOW (ref 8.5–10.1)
Chloride: 106 mmol/L (ref 98–107)
Co2: 23 mmol/L (ref 21–32)
Creatinine: 2.69 mg/dL — ABNORMAL HIGH (ref 0.60–1.30)
GFR CALC AF AMER: 29 — AB
GFR CALC NON AF AMER: 24 — AB
Glucose: 123 mg/dL — ABNORMAL HIGH (ref 65–99)
Osmolality: 293 (ref 275–301)
Potassium: 4.4 mmol/L (ref 3.5–5.1)
SODIUM: 135 mmol/L — AB (ref 136–145)

## 2014-05-13 LAB — IRON AND TIBC
IRON BIND. CAP.(TOTAL): 177 ug/dL — AB (ref 250–450)
Iron Saturation: 14 %
Iron: 24 ug/dL — ABNORMAL LOW (ref 65–175)
UNBOUND IRON-BIND. CAP.: 153 ug/dL

## 2014-05-13 LAB — LACTATE DEHYDROGENASE: LDH: 191 U/L (ref 85–241)

## 2014-05-13 LAB — PROTIME-INR
INR: 1.5
PROTHROMBIN TIME: 17.8 s — AB (ref 11.5–14.7)

## 2014-05-13 LAB — FERRITIN: Ferritin (ARMC): 467 ng/mL — ABNORMAL HIGH (ref 8–388)

## 2014-05-14 LAB — CBC WITH DIFFERENTIAL/PLATELET
BASOS PCT: 0.3 %
Basophil #: 0 10*3/uL (ref 0.0–0.1)
Eosinophil #: 0.2 10*3/uL (ref 0.0–0.7)
Eosinophil %: 2.6 %
HCT: 25.3 % — ABNORMAL LOW (ref 40.0–52.0)
HGB: 8.1 g/dL — ABNORMAL LOW (ref 13.0–18.0)
LYMPHS PCT: 12.9 %
Lymphocyte #: 0.8 10*3/uL — ABNORMAL LOW (ref 1.0–3.6)
MCH: 32.2 pg (ref 26.0–34.0)
MCHC: 32 g/dL (ref 32.0–36.0)
MCV: 101 fL — ABNORMAL HIGH (ref 80–100)
Monocyte #: 0.8 x10 3/mm (ref 0.2–1.0)
Monocyte %: 11.6 %
Neutrophil #: 4.7 10*3/uL (ref 1.4–6.5)
Neutrophil %: 72.6 %
Platelet: 57 10*3/uL — ABNORMAL LOW (ref 150–440)
RBC: 2.51 10*6/uL — AB (ref 4.40–5.90)
RDW: 15.3 % — ABNORMAL HIGH (ref 11.5–14.5)
WBC: 6.5 10*3/uL (ref 3.8–10.6)

## 2014-05-14 LAB — BASIC METABOLIC PANEL
Anion Gap: 11 (ref 7–16)
BUN: 81 mg/dL — AB (ref 7–18)
CHLORIDE: 109 mmol/L — AB (ref 98–107)
CREATININE: 2.58 mg/dL — AB (ref 0.60–1.30)
Calcium, Total: 8.5 mg/dL (ref 8.5–10.1)
Co2: 19 mmol/L — ABNORMAL LOW (ref 21–32)
EGFR (Non-African Amer.): 25 — ABNORMAL LOW
GFR CALC AF AMER: 30 — AB
Glucose: 117 mg/dL — ABNORMAL HIGH (ref 65–99)
OSMOLALITY: 303 (ref 275–301)
POTASSIUM: 4.7 mmol/L (ref 3.5–5.1)
SODIUM: 139 mmol/L (ref 136–145)

## 2014-05-14 LAB — PROTIME-INR
INR: 1.5
PROTHROMBIN TIME: 17.6 s — AB (ref 11.5–14.7)

## 2014-05-15 LAB — CBC WITH DIFFERENTIAL/PLATELET
BASOS ABS: 0 10*3/uL (ref 0.0–0.1)
BASOS PCT: 0.4 %
EOS PCT: 4 %
Eosinophil #: 0.2 10*3/uL (ref 0.0–0.7)
HCT: 24.8 % — ABNORMAL LOW (ref 40.0–52.0)
HGB: 8 g/dL — ABNORMAL LOW (ref 13.0–18.0)
LYMPHS ABS: 0.8 10*3/uL — AB (ref 1.0–3.6)
Lymphocyte %: 13.8 %
MCH: 32.2 pg (ref 26.0–34.0)
MCHC: 32.2 g/dL (ref 32.0–36.0)
MCV: 100 fL (ref 80–100)
MONOS PCT: 12.4 %
Monocyte #: 0.7 x10 3/mm (ref 0.2–1.0)
Neutrophil #: 3.8 10*3/uL (ref 1.4–6.5)
Neutrophil %: 69.4 %
Platelet: 191 10*3/uL (ref 150–440)
RBC: 2.48 10*6/uL — ABNORMAL LOW (ref 4.40–5.90)
RDW: 15.6 % — ABNORMAL HIGH (ref 11.5–14.5)
WBC: 5.5 10*3/uL (ref 3.8–10.6)

## 2014-05-16 LAB — CBC WITH DIFFERENTIAL/PLATELET
Basophil #: 0 10*3/uL (ref 0.0–0.1)
Basophil %: 0.2 %
EOS ABS: 0.1 10*3/uL (ref 0.0–0.7)
EOS PCT: 1.5 %
HCT: 29.2 % — AB (ref 40.0–52.0)
HGB: 9.4 g/dL — ABNORMAL LOW (ref 13.0–18.0)
LYMPHS ABS: 0.5 10*3/uL — AB (ref 1.0–3.6)
Lymphocyte %: 7.8 %
MCH: 31.8 pg (ref 26.0–34.0)
MCHC: 32.3 g/dL (ref 32.0–36.0)
MCV: 98 fL (ref 80–100)
Monocyte #: 0.7 x10 3/mm (ref 0.2–1.0)
Monocyte %: 10.8 %
Neutrophil #: 5.3 10*3/uL (ref 1.4–6.5)
Neutrophil %: 79.7 %
Platelet: 186 10*3/uL (ref 150–440)
RBC: 2.97 10*6/uL — AB (ref 4.40–5.90)
RDW: 16 % — ABNORMAL HIGH (ref 11.5–14.5)
WBC: 6.6 10*3/uL (ref 3.8–10.6)

## 2014-05-16 LAB — BASIC METABOLIC PANEL
ANION GAP: 10 (ref 7–16)
BUN: 62 mg/dL — AB (ref 7–18)
CHLORIDE: 112 mmol/L — AB (ref 98–107)
Calcium, Total: 8.7 mg/dL (ref 8.5–10.1)
Co2: 21 mmol/L (ref 21–32)
Creatinine: 1.91 mg/dL — ABNORMAL HIGH (ref 0.60–1.30)
EGFR (African American): 43 — ABNORMAL LOW
EGFR (Non-African Amer.): 35 — ABNORMAL LOW
Glucose: 92 mg/dL (ref 65–99)
OSMOLALITY: 302 (ref 275–301)
POTASSIUM: 4.6 mmol/L (ref 3.5–5.1)
Sodium: 143 mmol/L (ref 136–145)

## 2014-05-16 LAB — PROTIME-INR
INR: 1.5
Prothrombin Time: 18.2 secs — ABNORMAL HIGH (ref 11.5–14.7)

## 2014-05-17 LAB — BASIC METABOLIC PANEL
Anion Gap: 8 (ref 7–16)
BUN: 60 mg/dL — ABNORMAL HIGH (ref 7–18)
CALCIUM: 8.8 mg/dL (ref 8.5–10.1)
CO2: 21 mmol/L (ref 21–32)
CREATININE: 2.05 mg/dL — AB (ref 0.60–1.30)
Chloride: 113 mmol/L — ABNORMAL HIGH (ref 98–107)
EGFR (African American): 39 — ABNORMAL LOW
GFR CALC NON AF AMER: 32 — AB
GLUCOSE: 102 mg/dL — AB (ref 65–99)
Osmolality: 300 (ref 275–301)
Potassium: 4.7 mmol/L (ref 3.5–5.1)
SODIUM: 142 mmol/L (ref 136–145)

## 2014-05-17 LAB — PROTIME-INR
INR: 1.7
Prothrombin Time: 19.7 secs — ABNORMAL HIGH (ref 11.5–14.7)

## 2014-05-17 LAB — CBC WITH DIFFERENTIAL/PLATELET
BASOS ABS: 0 10*3/uL (ref 0.0–0.1)
Basophil %: 0.4 %
EOS PCT: 3.3 %
Eosinophil #: 0.2 10*3/uL (ref 0.0–0.7)
HCT: 28.4 % — ABNORMAL LOW (ref 40.0–52.0)
HGB: 9.4 g/dL — ABNORMAL LOW (ref 13.0–18.0)
Lymphocyte #: 0.6 10*3/uL — ABNORMAL LOW (ref 1.0–3.6)
Lymphocyte %: 9.4 %
MCH: 32.6 pg (ref 26.0–34.0)
MCHC: 33 g/dL (ref 32.0–36.0)
MCV: 99 fL (ref 80–100)
MONOS PCT: 13.5 %
Monocyte #: 0.9 x10 3/mm (ref 0.2–1.0)
NEUTROS PCT: 73.4 %
Neutrophil #: 4.7 10*3/uL (ref 1.4–6.5)
Platelet: 164 10*3/uL (ref 150–440)
RBC: 2.88 10*6/uL — ABNORMAL LOW (ref 4.40–5.90)
RDW: 16 % — AB (ref 11.5–14.5)
WBC: 6.4 10*3/uL (ref 3.8–10.6)

## 2014-05-18 LAB — CBC WITH DIFFERENTIAL/PLATELET
Bands: 2 %
HCT: 26.7 % — AB (ref 40.0–52.0)
HGB: 8.8 g/dL — ABNORMAL LOW (ref 13.0–18.0)
LYMPHS PCT: 16 %
MCH: 32.4 pg (ref 26.0–34.0)
MCHC: 32.9 g/dL (ref 32.0–36.0)
MCV: 98 fL (ref 80–100)
MONOS PCT: 12 %
Metamyelocyte: 1 %
Myelocyte: 2 %
NRBC/100 WBC: 2 /
Platelet: 150 10*3/uL (ref 150–440)
RBC: 2.72 10*6/uL — ABNORMAL LOW (ref 4.40–5.90)
RDW: 15.7 % — ABNORMAL HIGH (ref 11.5–14.5)
Segmented Neutrophils: 67 %
WBC: 6 10*3/uL (ref 3.8–10.6)

## 2014-05-18 LAB — BASIC METABOLIC PANEL
Anion Gap: 7 (ref 7–16)
BUN: 58 mg/dL — ABNORMAL HIGH (ref 7–18)
CHLORIDE: 113 mmol/L — AB (ref 98–107)
CO2: 20 mmol/L — AB (ref 21–32)
Calcium, Total: 8.7 mg/dL (ref 8.5–10.1)
Creatinine: 2.02 mg/dL — ABNORMAL HIGH (ref 0.60–1.30)
EGFR (African American): 40 — ABNORMAL LOW
GFR CALC NON AF AMER: 33 — AB
Glucose: 105 mg/dL — ABNORMAL HIGH (ref 65–99)
Osmolality: 296 (ref 275–301)
Potassium: 4.3 mmol/L (ref 3.5–5.1)
SODIUM: 140 mmol/L (ref 136–145)

## 2014-05-18 LAB — PROTIME-INR
INR: 2.1
Prothrombin Time: 22.9 secs — ABNORMAL HIGH (ref 11.5–14.7)

## 2014-05-19 LAB — CBC WITH DIFFERENTIAL/PLATELET
BASOS ABS: 0 10*3/uL (ref 0.0–0.1)
Basophil %: 0.3 %
EOS PCT: 3 %
Eosinophil #: 0.2 10*3/uL (ref 0.0–0.7)
HCT: 27.2 % — ABNORMAL LOW (ref 40.0–52.0)
HGB: 8.6 g/dL — ABNORMAL LOW (ref 13.0–18.0)
Lymphocyte #: 0.8 10*3/uL — ABNORMAL LOW (ref 1.0–3.6)
Lymphocyte %: 13.1 %
MCH: 31.6 pg (ref 26.0–34.0)
MCHC: 31.5 g/dL — AB (ref 32.0–36.0)
MCV: 100 fL (ref 80–100)
MONOS PCT: 14.3 %
Monocyte #: 0.9 x10 3/mm (ref 0.2–1.0)
Neutrophil #: 4.3 10*3/uL (ref 1.4–6.5)
Neutrophil %: 69.3 %
Platelet: 140 10*3/uL — ABNORMAL LOW (ref 150–440)
RBC: 2.71 10*6/uL — AB (ref 4.40–5.90)
RDW: 15.6 % — ABNORMAL HIGH (ref 11.5–14.5)
WBC: 6.2 10*3/uL (ref 3.8–10.6)

## 2014-05-19 LAB — CREATININE, SERUM
Creatinine: 1.91 mg/dL — ABNORMAL HIGH (ref 0.60–1.30)
EGFR (African American): 43 — ABNORMAL LOW
EGFR (Non-African Amer.): 35 — ABNORMAL LOW

## 2014-05-19 LAB — PROTIME-INR
INR: 2.1
PROTHROMBIN TIME: 23.3 s — AB (ref 11.5–14.7)

## 2014-05-20 LAB — BASIC METABOLIC PANEL
Anion Gap: 8 (ref 7–16)
BUN: 56 mg/dL — ABNORMAL HIGH (ref 7–18)
CREATININE: 1.79 mg/dL — AB (ref 0.60–1.30)
Calcium, Total: 9 mg/dL (ref 8.5–10.1)
Chloride: 112 mmol/L — ABNORMAL HIGH (ref 98–107)
Co2: 21 mmol/L (ref 21–32)
EGFR (African American): 46 — ABNORMAL LOW
GFR CALC NON AF AMER: 38 — AB
GLUCOSE: 109 mg/dL — AB (ref 65–99)
Osmolality: 297 (ref 275–301)
POTASSIUM: 4.4 mmol/L (ref 3.5–5.1)
SODIUM: 141 mmol/L (ref 136–145)

## 2014-05-20 LAB — PROTIME-INR
INR: 2.2
PROTHROMBIN TIME: 23.9 s — AB (ref 11.5–14.7)

## 2014-05-21 DIAGNOSIS — E119 Type 2 diabetes mellitus without complications: Secondary | ICD-10-CM

## 2014-05-21 DIAGNOSIS — S728X2D Other fracture of left femur, subsequent encounter for closed fracture with routine healing: Secondary | ICD-10-CM

## 2014-05-21 DIAGNOSIS — I5022 Chronic systolic (congestive) heart failure: Secondary | ICD-10-CM

## 2014-05-21 DIAGNOSIS — N183 Chronic kidney disease, stage 3 (moderate): Secondary | ICD-10-CM

## 2014-05-21 LAB — PATHOLOGY REPORT

## 2014-05-25 DIAGNOSIS — S90422A Blister (nonthermal), left great toe, initial encounter: Secondary | ICD-10-CM

## 2014-06-04 DIAGNOSIS — E11621 Type 2 diabetes mellitus with foot ulcer: Secondary | ICD-10-CM

## 2014-06-04 DIAGNOSIS — L97101 Non-pressure chronic ulcer of unspecified thigh limited to breakdown of skin: Secondary | ICD-10-CM

## 2014-06-07 ENCOUNTER — Ambulatory Visit: Payer: Self-pay | Admitting: Oncology

## 2014-06-08 DIAGNOSIS — S7292XA Unspecified fracture of left femur, initial encounter for closed fracture: Secondary | ICD-10-CM

## 2014-06-08 DIAGNOSIS — N183 Chronic kidney disease, stage 3 (moderate): Secondary | ICD-10-CM

## 2014-06-08 DIAGNOSIS — I5022 Chronic systolic (congestive) heart failure: Secondary | ICD-10-CM

## 2014-06-08 DIAGNOSIS — I480 Paroxysmal atrial fibrillation: Secondary | ICD-10-CM

## 2014-07-24 ENCOUNTER — Emergency Department: Payer: Self-pay | Admitting: Emergency Medicine

## 2014-07-24 LAB — BASIC METABOLIC PANEL
Anion Gap: 8 (ref 7–16)
BUN: 57 mg/dL — ABNORMAL HIGH (ref 7–18)
Calcium, Total: 8.9 mg/dL (ref 8.5–10.1)
Chloride: 104 mmol/L (ref 98–107)
Co2: 23 mmol/L (ref 21–32)
Creatinine: 2.06 mg/dL — ABNORMAL HIGH (ref 0.60–1.30)
EGFR (African American): 39 — ABNORMAL LOW
EGFR (Non-African Amer.): 32 — ABNORMAL LOW
Glucose: 131 mg/dL — ABNORMAL HIGH (ref 65–99)
Osmolality: 288 (ref 275–301)
Potassium: 4.5 mmol/L (ref 3.5–5.1)
Sodium: 135 mmol/L — ABNORMAL LOW (ref 136–145)

## 2014-07-24 LAB — CBC
HCT: 31.7 % — AB (ref 40.0–52.0)
HGB: 10.1 g/dL — ABNORMAL LOW (ref 13.0–18.0)
MCH: 32.6 pg (ref 26.0–34.0)
MCHC: 31.9 g/dL — AB (ref 32.0–36.0)
MCV: 102 fL — AB (ref 80–100)
Platelet: 73 10*3/uL — ABNORMAL LOW (ref 150–440)
RBC: 3.1 10*6/uL — ABNORMAL LOW (ref 4.40–5.90)
RDW: 17.3 % — ABNORMAL HIGH (ref 11.5–14.5)
WBC: 3.4 10*3/uL — ABNORMAL LOW (ref 3.8–10.6)

## 2014-07-24 LAB — PROTIME-INR
INR: 2.2
Prothrombin Time: 23.7 secs — ABNORMAL HIGH (ref 11.5–14.7)

## 2014-07-24 LAB — PRO B NATRIURETIC PEPTIDE: B-TYPE NATIURETIC PEPTID: 17001 pg/mL — AB (ref 0–450)

## 2014-07-24 LAB — TROPONIN I: TROPONIN-I: 0.12 ng/mL — AB

## 2014-08-10 ENCOUNTER — Inpatient Hospital Stay: Payer: Self-pay | Admitting: Cardiology

## 2014-08-10 LAB — CBC WITH DIFFERENTIAL/PLATELET
Basophil #: 0.1 10*3/uL (ref 0.0–0.1)
Basophil %: 1.1 %
Eosinophil #: 0 10*3/uL (ref 0.0–0.7)
Eosinophil %: 0.5 %
HCT: 36.6 % — ABNORMAL LOW (ref 40.0–52.0)
HGB: 11.6 g/dL — AB (ref 13.0–18.0)
LYMPHS PCT: 11.3 %
Lymphocyte #: 0.7 10*3/uL — ABNORMAL LOW (ref 1.0–3.6)
MCH: 32.5 pg (ref 26.0–34.0)
MCHC: 31.6 g/dL — AB (ref 32.0–36.0)
MCV: 103 fL — AB (ref 80–100)
Monocyte #: 0.5 x10 3/mm (ref 0.2–1.0)
Monocyte %: 9.3 %
NEUTROS ABS: 4.5 10*3/uL (ref 1.4–6.5)
Neutrophil %: 77.8 %
Platelet: 55 10*3/uL — ABNORMAL LOW (ref 150–440)
RBC: 3.56 10*6/uL — ABNORMAL LOW (ref 4.40–5.90)
RDW: 17.1 % — ABNORMAL HIGH (ref 11.5–14.5)
WBC: 5.8 10*3/uL (ref 3.8–10.6)

## 2014-08-10 LAB — BASIC METABOLIC PANEL
Anion Gap: 7 (ref 7–16)
BUN: 67 mg/dL — ABNORMAL HIGH (ref 7–18)
CALCIUM: 9.2 mg/dL (ref 8.5–10.1)
CHLORIDE: 106 mmol/L (ref 98–107)
CO2: 26 mmol/L (ref 21–32)
Creatinine: 1.97 mg/dL — ABNORMAL HIGH (ref 0.60–1.30)
EGFR (African American): 41 — ABNORMAL LOW
GFR CALC NON AF AMER: 34 — AB
GLUCOSE: 125 mg/dL — AB (ref 65–99)
OSMOLALITY: 298 (ref 275–301)
POTASSIUM: 4.8 mmol/L (ref 3.5–5.1)
SODIUM: 139 mmol/L (ref 136–145)

## 2014-08-10 LAB — PROTIME-INR
INR: 2.4
PROTHROMBIN TIME: 25.7 s — AB (ref 11.5–14.7)

## 2014-08-11 LAB — BASIC METABOLIC PANEL
ANION GAP: 9 (ref 7–16)
BUN: 61 mg/dL — ABNORMAL HIGH (ref 7–18)
CHLORIDE: 105 mmol/L (ref 98–107)
CO2: 26 mmol/L (ref 21–32)
Calcium, Total: 9 mg/dL (ref 8.5–10.1)
Creatinine: 1.94 mg/dL — ABNORMAL HIGH (ref 0.60–1.30)
EGFR (African American): 42 — ABNORMAL LOW
EGFR (Non-African Amer.): 35 — ABNORMAL LOW
Glucose: 135 mg/dL — ABNORMAL HIGH (ref 65–99)
Osmolality: 299 (ref 275–301)
Potassium: 4.4 mmol/L (ref 3.5–5.1)
Sodium: 140 mmol/L (ref 136–145)

## 2014-08-12 LAB — BASIC METABOLIC PANEL
Anion Gap: 9 (ref 7–16)
BUN: 68 mg/dL — AB (ref 7–18)
CHLORIDE: 103 mmol/L (ref 98–107)
CREATININE: 2.11 mg/dL — AB (ref 0.60–1.30)
Calcium, Total: 9.1 mg/dL (ref 8.5–10.1)
Co2: 28 mmol/L (ref 21–32)
EGFR (African American): 38 — ABNORMAL LOW
EGFR (Non-African Amer.): 31 — ABNORMAL LOW
GLUCOSE: 136 mg/dL — AB (ref 65–99)
Osmolality: 301 (ref 275–301)
Potassium: 4.2 mmol/L (ref 3.5–5.1)
Sodium: 140 mmol/L (ref 136–145)

## 2014-08-12 LAB — PROTIME-INR
INR: 2.1
Prothrombin Time: 23.2 secs — ABNORMAL HIGH (ref 11.5–14.7)

## 2014-08-13 LAB — BASIC METABOLIC PANEL
ANION GAP: 9 (ref 7–16)
BUN: 65 mg/dL — ABNORMAL HIGH (ref 7–18)
CHLORIDE: 99 mmol/L (ref 98–107)
CO2: 29 mmol/L (ref 21–32)
Calcium, Total: 9.1 mg/dL (ref 8.5–10.1)
Creatinine: 2.11 mg/dL — ABNORMAL HIGH (ref 0.60–1.30)
EGFR (African American): 38 — ABNORMAL LOW
EGFR (Non-African Amer.): 31 — ABNORMAL LOW
Glucose: 142 mg/dL — ABNORMAL HIGH (ref 65–99)
Osmolality: 295 (ref 275–301)
Potassium: 4.1 mmol/L (ref 3.5–5.1)
Sodium: 137 mmol/L (ref 136–145)

## 2014-08-18 ENCOUNTER — Emergency Department: Payer: Self-pay | Admitting: Emergency Medicine

## 2014-08-20 DIAGNOSIS — I5022 Chronic systolic (congestive) heart failure: Secondary | ICD-10-CM

## 2014-08-20 DIAGNOSIS — N401 Enlarged prostate with lower urinary tract symptoms: Secondary | ICD-10-CM

## 2014-08-20 DIAGNOSIS — S42202A Unspecified fracture of upper end of left humerus, initial encounter for closed fracture: Secondary | ICD-10-CM

## 2014-08-20 DIAGNOSIS — N183 Chronic kidney disease, stage 3 (moderate): Secondary | ICD-10-CM

## 2014-08-20 DIAGNOSIS — E119 Type 2 diabetes mellitus without complications: Secondary | ICD-10-CM

## 2014-08-20 DIAGNOSIS — I480 Paroxysmal atrial fibrillation: Secondary | ICD-10-CM

## 2014-09-03 DIAGNOSIS — L97329 Non-pressure chronic ulcer of left ankle with unspecified severity: Secondary | ICD-10-CM

## 2014-09-08 ENCOUNTER — Ambulatory Visit: Payer: Self-pay | Admitting: Internal Medicine

## 2014-09-17 DIAGNOSIS — N400 Enlarged prostate without lower urinary tract symptoms: Secondary | ICD-10-CM

## 2014-09-17 DIAGNOSIS — S42202A Unspecified fracture of upper end of left humerus, initial encounter for closed fracture: Secondary | ICD-10-CM

## 2014-09-17 DIAGNOSIS — I5022 Chronic systolic (congestive) heart failure: Secondary | ICD-10-CM

## 2014-09-17 DIAGNOSIS — I480 Paroxysmal atrial fibrillation: Secondary | ICD-10-CM

## 2014-09-17 DIAGNOSIS — N183 Chronic kidney disease, stage 3 (moderate): Secondary | ICD-10-CM

## 2014-10-06 ENCOUNTER — Ambulatory Visit
Admit: 2014-10-06 | Disposition: A | Discharge status: Home / Self Care | Payer: Self-pay | Attending: Oncology | Admitting: Oncology

## 2014-10-20 DIAGNOSIS — N184 Chronic kidney disease, stage 4 (severe): Secondary | ICD-10-CM | POA: Diagnosis not present

## 2014-10-20 DIAGNOSIS — I480 Paroxysmal atrial fibrillation: Secondary | ICD-10-CM | POA: Diagnosis not present

## 2014-10-20 DIAGNOSIS — I5022 Chronic systolic (congestive) heart failure: Secondary | ICD-10-CM | POA: Diagnosis not present

## 2014-10-20 DIAGNOSIS — S42202A Unspecified fracture of upper end of left humerus, initial encounter for closed fracture: Secondary | ICD-10-CM | POA: Diagnosis not present

## 2014-10-23 ENCOUNTER — Inpatient Hospital Stay: Payer: Self-pay | Admitting: Internal Medicine

## 2014-11-06 ENCOUNTER — Ambulatory Visit
Admit: 2014-11-06 | Disposition: A | Discharge status: Home / Self Care | Payer: Self-pay | Attending: Oncology | Admitting: Oncology

## 2014-11-06 LAB — CBC WITH DIFFERENTIAL/PLATELET
BASOS ABS: 0 10*3/uL (ref 0.0–0.1)
Basophil %: 0.2 %
EOS PCT: 3.4 %
Eosinophil #: 0.2 10*3/uL (ref 0.0–0.7)
HCT: 26.7 % — AB (ref 40.0–52.0)
HGB: 8.6 g/dL — AB (ref 13.0–18.0)
Lymphocyte #: 0.7 10*3/uL — ABNORMAL LOW (ref 1.0–3.6)
Lymphocyte %: 13.7 %
MCH: 33.1 pg (ref 26.0–34.0)
MCHC: 32.3 g/dL (ref 32.0–36.0)
MCV: 102 fL — AB (ref 80–100)
MONOS PCT: 14.7 %
Monocyte #: 0.7 x10 3/mm (ref 0.2–1.0)
Neutrophil #: 3.4 10*3/uL (ref 1.4–6.5)
Neutrophil %: 68 %
Platelet: 54 10*3/uL — ABNORMAL LOW (ref 150–440)
RBC: 2.61 10*6/uL — AB (ref 4.40–5.90)
RDW: 17.1 % — ABNORMAL HIGH (ref 11.5–14.5)
WBC: 4.9 10*3/uL (ref 3.8–10.6)

## 2014-11-06 LAB — BASIC METABOLIC PANEL
Anion Gap: 10 (ref 7–16)
Anion Gap: 10 (ref 7–16)
BUN: 68 mg/dL — AB
BUN: 70 mg/dL — ABNORMAL HIGH
CALCIUM: 9.5 mg/dL
CHLORIDE: 107 mmol/L
CHLORIDE: 104 mmol/L
CREATININE: 2.49 mg/dL — AB
Calcium, Total: 9.3 mg/dL
Co2: 22 mmol/L
Co2: 23 mmol/L
Creatinine: 2.52 mg/dL — ABNORMAL HIGH
EGFR (African American): 25 — ABNORMAL LOW
EGFR (Non-African Amer.): 21 — ABNORMAL LOW
GFR CALC AF AMER: 24 — AB
GFR CALC NON AF AMER: 21 — AB
GLUCOSE: 181 mg/dL — AB
Glucose: 127 mg/dL — ABNORMAL HIGH
POTASSIUM: 3.9 mmol/L
Potassium: 3.8 mmol/L
SODIUM: 139 mmol/L
SODIUM: 137 mmol/L

## 2014-11-06 LAB — PROTIME-INR
INR: 2.9
INR: 2.8
PROTHROMBIN TIME: 29.3 s — AB
Prothrombin Time: 30 secs — ABNORMAL HIGH

## 2014-11-09 ENCOUNTER — Ambulatory Visit
Admit: 2014-11-09 | Disposition: A | Discharge status: Home / Self Care | Payer: Self-pay | Attending: Oncology | Admitting: Oncology

## 2014-11-09 LAB — CBC CANCER CENTER
Basophil #: 0 x10 3/mm (ref 0.0–0.1)
Basophil %: 0.7 %
EOS ABS: 0.2 x10 3/mm (ref 0.0–0.7)
Eosinophil %: 3.9 %
HCT: 32 % — ABNORMAL LOW (ref 40.0–52.0)
HGB: 10.2 g/dL — ABNORMAL LOW (ref 13.0–18.0)
Lymphocyte #: 0.8 x10 3/mm — ABNORMAL LOW (ref 1.0–3.6)
Lymphocyte %: 16.8 %
MCH: 33.4 pg (ref 26.0–34.0)
MCHC: 31.9 g/dL — AB (ref 32.0–36.0)
MCV: 105 fL — ABNORMAL HIGH (ref 80–100)
MONO ABS: 0.9 x10 3/mm (ref 0.2–1.0)
Monocyte %: 18.1 %
NEUTROS PCT: 60.5 %
Neutrophil #: 3 x10 3/mm (ref 1.4–6.5)
Platelet: 91 x10 3/mm — ABNORMAL LOW (ref 150–440)
RBC: 3.06 10*6/uL — AB (ref 4.40–5.90)
RDW: 18.2 % — ABNORMAL HIGH (ref 11.5–14.5)
WBC: 5 x10 3/mm (ref 3.8–10.6)

## 2014-11-09 LAB — IRON AND TIBC
IRON BIND. CAP.(TOTAL): 254 (ref 250–450)
IRON SATURATION: 16.9
IRON: 43 ug/dL — AB
UNBOUND IRON-BIND. CAP.: 211.4

## 2014-11-09 LAB — FERRITIN: FERRITIN (ARMC): 378 ng/mL — AB

## 2014-11-26 LAB — HEMOGLOBIN: HGB: 10.7 g/dL — ABNORMAL LOW (ref 13.0–18.0)

## 2014-11-27 NOTE — H&P (Signed)
PATIENT NAME:  [name], [name] MR#:  [id number] DATE OF BIRTH:  [birth year]  DATE OF ADMISSION:  03/04/2013  ADMITTING PHYSICIAN: Gladstone Lighter, MD  PRIMARY CARE PHYSICIAN: [name], MD  PRIMARY CARDIOLOGIST: Isaias Cowman, MD  PRIMARY NEPHROLOGIST: Murlean Iba, MD   CHIEF COMPLAINT: Not feeling well and dyspnea on exertion.  BRIEF HISTORY:  Mr. Bonanno is a 81 year old very pleasant Caucasian male with past medical history significant for aortic valve replacement with a bovine valve, sick sinus syndrome status post pacemaker, history of chronic atrial fibrillation on anticoagulation,  diabetes, hypertension and congestive heart failure with last known ejection fraction of 40% who presents to the hospital secondary to the above-mentioned complaints. The patient is very active and functional, continues to drive, lives by himself at home, ambulates without any help and sometimes uses a cane.  His ambulation has been a little limited mostly secondary to dyspnea on exertion and this dyspnea has been gradually getting worse over the past couple of months. It is to the point that even if he bends down, tries to move around he gets easy short of breath and improves at resting. Denies any chest pain, nausea, vomiting. The patient always has had chronic low back pain, which he feels like it is spreading up his spine. He was also followed by pain management for the same. Since yesterday has not been feeling well, feels lousy. His breathing has not been improving so presents to the hospital. Here in the ER, blood work reveals mild elevation in troponin so he is being admitted.   PAST MEDICAL HISTORY: 1.  Hypertension.  2.  Congestive heart failure with systolic dysfunction, last known ejection fraction of 40%.  3.  CKD stage III, baseline creatinine of 1.8 from 2012.  4.  Chronic atrial fibrillation, on Coumadin.  5.  History of bladder cancer, status post surgery.  6.  Pulmonary hypertension.   7.  Aortic valve replacement with a bovine valve, done at [facility] in 2010. 8.  Diabetes mellitus.  9.  Sick sinus syndrome status post pacemaker.  10.  Anemia of chronic disease for which he follows at the [facility].   PAST SURGICAL HISTORY: 1.  Right knee replacement twice. 2.  Bilateral cataract surgery.  3.  Pacemaker.  4.  Aortic valve replacement.  5.  Skin cancer resection from his scalp. 6.  Bladder cancer resection.   MEDICATION ALLERGIES: PENICILLIN AND XYLOCAINE.   CURRENT HOME MEDICATIONS:  Include:  1.  Zocor 40 mg p.o. daily. 2.  Actos 45 mg p.o. daily. 3.  Allopurinol 100 mg p.o. daily.  4.  Coreg 12.5 mg p.o. b.i.d.  5.  Coumadin 5 mg p.o. daily.  6.  Ferrous sulfate 325 mg p.o. b.i.d.  7.  Lasix 40 mg p.o. daily. 8.  Januvia 50 mg p.o. daily.  9.  Lisinopril 5 mg p.o. daily.  10.  Prilosec 40 mg p.o. daily.  11.  Potassium chloride 20 mEq p.o. daily.  12.  Sertraline 50 mg p.o. daily.  13.  Vitamin B12 1000 mcg p.o. daily.   SOCIAL HISTORY: Lives at home by himself. He has 7 children and several grandchildren and also great-grandchildren.  He ambulates with a cane.  Again his mobility is limited secondary to his dyspnea rather than generalized weakness. He denies any smoking. Occasional alcohol use present. No drug use.   FAMILY HISTORY: Father died from MI in his 75s.  Mom lived up to 56 and died from old age.  Sister with myelofibrosis.   REVIEW OF SYSTEMS: CONSTITUTIONAL: No fatigue, fever or weakness.  EYES: Status post cataract surgery. Uses reading glasses. No blurred vision, double vision, glaucoma.  ENT: No tinnitus, ear pain, hearing loss, epistaxis or discharge.  RESPIRATORY: Positive for dyspnea on minimal exertion. No cough, wheezing or hemoptysis.  CARDIOVASCULAR: No chest pain. Positive for orthopnea and dyspnea on exertion. No palpitations or syncope.  GASTROINTESTINAL: No nausea, vomiting, abdominal pain, hematemesis or melena.   GENITOURINARY: No dysuria, hematuria, renal calculus, frequency or incontinence.  ENDOCRINE: No polyuria, nocturia, thyroid problems, heat or cold intolerance.  HEMATOLOGY: No anemia, easy bruising or bleeding.  SKIN: No acne, rash or lesions.  MUSCULOSKELETAL: Positive for chronic low back pain radiating down the legs. No arthritis or gout.  NEUROLOGIC: No numbness, weakness, CVA, TIA or seizures.  PSYCHOLOGICAL: No anxiety, insomnia or depression.   PHYSICAL EXAMINATION: VITAL SIGNS: Temperature 97.3 degrees Fahrenheit, pulse 59, respirations 18, blood pressure 150/85, pulse ox 97% on room air.  GENERAL: Well-built, well-nourished male sitting in bed, not in any acute distress.  HEENT: Normocephalic, atraumatic. Pupils equal, round and reacting to light. Anicteric sclerae. Extraocular movements intact. Oropharynx clear without erythema, mass or exudates.  NECK: Supple. No thyromegaly, JVD or carotid bruits. No lymphadenopathy.  LUNGS: Moving air bilaterally. Fine bibasilar crackles. Poor inspiratory effort noted at the bases. No wheeze. No use of accessory muscles for breathing.  HEART:  S1 and S2 regular rate and rhythm. 4/6 systolic murmur in the aortic area.  No rubs or gallops.  ABDOMEN: Soft, nontender, nondistended. No hepatosplenomegaly. Normal bowel sounds.  EXTREMITIES: No pedal edema. No clubbing or cyanosis. 2+ dorsalis pedis pulses palpable bilaterally.  SKIN: No acne, rash or lesions.  LYMPHATICS: No cervical lymphadenopathy.  NEUROLOGIC: Cranial nerves intact.  Motor strength is 5/5 in all 4 extremities. Sensation is intact. 2+ deep tendon reflexes symmetric bilaterally, in both lower and upper extremities.  PSYCHOLOGICAL: The patient is awake, alert and oriented x 3.  LABORATORY AND DIAGNOSTICS: EKG is showing a paced rhythm.   WBC 4.3, hemoglobin 9.8, hematocrit 30.2, platelet count 90.   Sodium 138, potassium 4.5, chloride 106, bicarb 24, BUN 42, creatinine 2.1, glucose  119 and calcium 9.7.   ALT 20, AST 27, alk phos 83, total bilirubin 0.8 and albumin 3.8.   Chest x-ray revealing mild CHF, cardiomegaly noted with mild pulmonary vascular prominence and interstitial prominence. Small right pleural effusion.  Aortic valve replacement changes with medial sternotomy and cardiac pacer noted.  Lumbar spine x-ray showing diffuse degenerative changes of the lumbar spine. No acute abnormality identified. Thoracic spine showing no acute abnormality. Aortic valve replacement CABG changes, cardiac pacer and diffuse degenerative changes of the thoracic spine noted.  INR is 2.2. Urinalysis:  Negative for any infection. Magnesium 1.6.   Echocardiogram done showing LV ejection fraction calculated to be 40% to 45%, decreased systolic function, anterior septal mid and apical inferior septum are abnormal diffusely, dilated left atrium and moderately dilated right atrium, moderate mitral valve regurgitation, severe tricuspid regurgitation and elevated pulmonary arterial pressures noted.   ASSESSMENT AND PLAN: A 81 year old male with past medical history significant for chronic atrial fibrillation and sick sinus syndrome status post pacemaker on Coumadin, bovine aortic valve replacement, congestive heart failure with ejection fraction of 40%, chronic kidney disease with last known creatinine of 1.79, hypertension and diabetes admitted secondary to dyspnea on exertion and elevated troponin.  1.  Acute on chronic dyspnea on exertion, getting worse lately which  is limiting his activity. Likely secondary to worsening cardiomyopathy and congestive heart failure and valvular disease. X-ray showing mild congestive heart failure exacerbation. The patient is already on Lasix 40 daily. Does not appear fluid overloaded on exam so we will change that 40 to IV at this time. Monitor on telemetry. We will repeat the echo and cardiology has been consulted. Less likely to be any other causes like pulmonary  embolus because he is already therapeutic on warfarin.  2.  Acute on chronic systolic congestive heart failure exacerbation. As mentioned above, will change the Lasix to IV.  Echo, cardiology consult and continue his medications.  3.  Elevated troponin, likely chronic from valve disease and chronic kidney disease. Recycle enzymes for now.  4.  Chronic kidney disease.  Last known creatinine of 1.79 and now it is at 2.  Follows with Dr. Candiss Norse as an outpatient.  I will put in a nephrology consult especially with being on warfarin.  5.  Acute on chronic back pain. Has diffuse degenerative disk disease as noted on x-rays and also spinal stenosis. Follows with pain management as an outpatient. Will continue pain medications and physical therapy consult. 6.  Aortic valve replacement with a bovine valve. 7.  Gastrointestinal and deep vein thrombosis prophylaxis. On Prilosec and warfarin.   CODE STATUS: FULL CODE.   TIME SPENT ON ADMISSION: 50 minutes. ____________________________ Gladstone Lighter, MD rk:sb D: 03/04/2013 15:15:43 ET T: 03/04/2013 15:49:55 ET JOB#: [id number]  cc: Gladstone Lighter, MD, <Dictator> Isaias Cowman, MD Murlean Iba, MD Irven Easterly. Kary Kos, MD Gladstone Lighter MD ELECTRONICALLY SIGNED 03/10/2013 14:53

## 2014-11-27 NOTE — Consult Note (Signed)
Brief Consult Note: Diagnosis: SOB,SSS,Elevated troponin.   Patient was seen by consultant.   Consult note dictated.   Recommend further assessment or treatment.   Orders entered.   Discussed with Attending MD.   Comments: IMP CHF SOB AoVR AFIB Anemia DM HTN CM CRI GERD Elevated troponin PulHTN . PLAN Tele ROMI Lasix 40 bid Agree with ECHO Continue coumadin Continue DM control F/U anemia , Hematology input Consider Nephrology into diuresis Chronic supp 02 for dyspnea I do not rec invasive eval at this point Case discussed with AP  [address] [place].  Electronic Signatures: Lujean Amel D (MD)  (Signed 30-Jul-14 07:07)  Authored: Brief Consult Note   Last Updated: 30-Jul-14 07:07 by Yolonda Kida (MD)

## 2014-11-27 NOTE — Discharge Summary (Signed)
PATIENT NAME:  [name], [name] MR#:  [id number] DATE OF BIRTH:  07/03/1920  DATE OF ADMISSION:  03/04/2013 DATE OF DISCHARGE:  03/05/2013  ATTENDING PHYSICIAN:  Gladstone Lighter, MD  DISCHARGING PHYSICIAN:  Gladstone Lighter, MD  PRIMARY CARE PHYSICIAN: [name], MD   PRIMARY CARDIOLOGIST: Dr. Saralyn Pilar.   [place] 1.  Cardiology consultation by Dr. Saralyn Pilar.  2.  Nephrology consultation with Dr. Candiss Norse.   DISCHARGE DIAGNOSES 1.  Acute on chronic systolic congestive heart failure exacerbation, ejection fraction of 40%.  2.  Acute on chronic low back pain.  3.  Chronic back pain secondary to spinal stenosis and diffuse degenerative disc disease.  4.  Aortic valve replacement with a bovine valve.  5.  Chronic atrial fibrillation, on Coumadin.  6.  Sick sinus syndrome, status post pacemaker.  7.  Anemia of chronic disease.  8.  Diabetes mellitus.  9.  Pulmonary hypertension.  10.  History of bladder cancer, status post surgery.  11.  Chronic kidney disease stage 3, baseline creatinine of 2.   12.  Hypertension.   DISCHARGE HOME MEDICATIONS 1.  Sertraline 50 mg p.o. at bedtime.  2.  Zocor 40 mg p.o. daily.  3.  Actos 45 mg p.o. daily.  4.  Allopurinol 100 mg p.o. daily.  5.  Coumadin 5 mg p.o. daily.  6.  Januvia 15 mg p.o. at bedtime.  7.  Omeprazole 40 mg p.o. daily.  8.  Ferrous sulfate 325 mg p.o. b.i.d.  9.  Vitamin B12 1000 mcg p.o. daily.  10.  Coreg 12.5 mg p.o. b.i.d.  11.  Potassium chloride 20 mEq p.o. daily.  12.  Lisinopril 5 mg p.o. daily.  13.  Albuterol inhaler 2 puffs q.6 hours p.r.n. for wheezing.  14.  Norco 5/325 mg 1 tablet q.8 hours p.r.n. for pain.  15.  Lasix 40 mg p.o. once a day alternating with p.o. twice a day 40 mg Lasix.   DISCHARGE DIET: Low-sodium, ADA 1800 diet.   DISCHARGE ACTIVITY: As tolerated.    FOLLOWUP INSTRUCTIONS 1.  PCP followup in 2 weeks.  2.  Cardiology followup with Dr. Saralyn Pilar in 2 weeks.  3.   Nephrology followup in 3 to 4 weeks.  4.  Pain management followup for his low back pain.   LABS AND IMAGING STUDIES PRIOR TO DISCHARGE 1.  WBC 3.7, hemoglobin 9.5, hematocrit 27.9, platelet count 70.  2.  Sodium 139, potassium 4.1, chloride 108, bicarbonate 26, BUN 38, creatinine 1.9, glucose 88 and calcium of 9.2.  3.  Troponins remained elevated at 0.13, stable in the hospital.  4.  INR is 3.4 prior to discharge.  5.  Thoracic spine x-ray showing diffuse degenerative changes, no acute abnormality.  6.  Lumbar spine x-ray showing diffuse degenerative changes. No acute abnormality.  7.  Echo Doppler showing LV ejection fraction is calculated to be 40% to 45%, global LV systolic function decreased. Anterior septum, mid apical, inferior septum are abnormal. Dilated left and right atria, moderate mitral valve regurgitation and severe tricuspid regurgitation with moderately elevated pulmonary artery pressures.  8.  Chest x-ray is showing CHF with pulmonary vascular interstitial prominence on admission.  9.  Urinalysis negative for any infection.   BRIEF HOSPITAL COURSE: Mr. [name] is a 81 year old active Caucasian gentleman with a past medical history significant for aortic valve surgery, chronic atrial fibrillation on Coumadin, sick sinus syndrome, status post pacemaker, congestive heart failure with systolic dysfunction, EF of 40%, hypertension, diabetes who lives  at home by himself, came to the hospital secondary to not feeling well, worsening dyspnea on exertion for the past 2 months.   1.  Acute on chronic dyspnea secondary to CHF exacerbation and systolic dysfunction, EF is preserved at 40% to 45% on repeat echo. His Lasix has been increased to 40 b.i.d. while in the hospital, which improved his dyspnea. He ambulated well with physical therapy, has not required any oxygen and his leg dyspnea improved so discussed with Dr. Saralyn Pilar at the time of discharge and I am discharging the patient on 40 mg of  Lasix every day alternating with 40 mg twice a day. He will follow up with Dr. Saralyn Pilar as an outpatient. His x-ray otherwise did not reveal any other acute abnormalities. The patient already on warfarin so is less likely to be any PE. The patient will be discharged on his cardiac medications which include Coreg and lisinopril. He is also on potassium supplements while taking Lasix.  2.  A. fib. Chronic A. fib and sick sinus syndrome, status post pacemaker. Rate well controlled. The patient is on Coreg. The patient is also on Coumadin and INR is within therapeutic range.  3.  Chronic back pain. He had an acute episode of worsening prior to presentation and he states he is from [facility] independent living and they are renovating his apartment and he had to move to a temporary place where his bed is not a reclining bed so his back pain kind of worsened related to that. He was on Flexeril and Norco in the hospital but he has not required any pain medication. He felt better lying on the bed here in the hospital.  His x-ray shows only diffuse degenerative disc disease. The patient followed with pain management and has epidural shots once in a while. The patient will continue to do the same. 4.  Diabetes mellitus. His home medications are being continued.   The patient's course has been otherwise uneventful in the hospital. The patient worked with physical therapy, who recommended discharge back to [facility] independent living facility.   DISCHARGE CONDITION: Stable.   DISCHARGE DISPOSITION: Home.   TIME SPENT ON DISCHARGE: 45 minutes.  ____________________________ Gladstone Lighter, MD rk:cs D: 03/05/2013 14:28:00 ET T: 03/05/2013 14:57:46 ET JOB#: [id number]  cc: Gladstone Lighter, MD, <Dictator> Irven Easterly. Kary Kos, MD Isaias Cowman, MD Murlean Iba, MD Gladstone Lighter MD ELECTRONICALLY SIGNED 03/10/2013 15:01

## 2014-11-28 NOTE — Discharge Summary (Signed)
PATIENT NAME:  [name], GODINHO MR#:  [id number] DATE OF BIRTH:  Apr 29, 1920  DATE OF ADMISSION:  05/07/2014 DATE OF DISCHARGE:  05/20/2014  ADDENDUM:  This is an addendum to the interim discharge summary dictated by Dr. Vianne Bulls on October 10.   During the patient's hospital stay he remained relatively stable. He continued to work progressively with physical therapy, improving slowly. He is on p.o. Levaquin for his pneumonia. The patient did have some issues with urinary retention after Foley was removed. It was reinserted, bladder training was done and the Foley removed.  The patient was able to void a little bit.  He will be followed at  and if he has issues regarding urinating consider urology outpatient followup.   The patient remained a no code, DNR.    HOME MEDICATIONS:  1.  Magnesium aluminum plus XS 30 mL q. 6 hours p.r.n.  2.  Astelin nasal spray 1 spray b.i.d. for 2 days.  3.  Coreg 12.5 b.i.d.  4.  Ferrous sulfate 325 mg daily.  5.  Sliding scale insulin.  6.  Oxycodone 5 mg p.o. q. 6 hours p.r.n.  7.  Tramadol 50 mg p.o. q. 4 hours p.r.n.  8.  Milk of magnesia 30 mL b.i.d. p.r.n.  9.  Lisinopril 2.5 mg p.o. daily.  10. Protonix 40 mg p.o. daily b.i.d.  11. K-Dur 10 mEq p.o. daily.  12. Sertraline 50 mg at bedtime.  13. Zocor 40 mg at bedtime.  14. Warfarin 5 mg daily at 5:00 p.m.  15. Flomax 0.4 mg p.o. daily.  16. Lasix 40 mg daily.  17. Levaquin 750 p.o. q. 48 hours for 3 more days.  18. Diaper dermatitis ointment.  19. Lasix 40 mg daily.  20. Tylenol 500 mg q. 4 hours p.r.n.  21. Allopurinol 100 mg daily.  22. Senokot-S 1 tablet b.i.d.  23. Bisacodyl 10 mg rectally daily p.r.n. as needed.   DISCHARGE INSTRUCTIONS:  1.  Two-grams sodium diet.  2.  Physical therapy.  3.  Follow up with Dr. Marry Guan as per his discharge instructions.   TIME SPENT: 40 minutes.    ____________________________ Hart [place] Posey Pronto, MD sap:lt D: 05/20/2014 12:59:38 ET T: 05/20/2014  13:18:40 ET JOB#: [id number]  cc: Deagen Krass A. Posey Pronto, MD, <Dictator> Ilda Basset MD ELECTRONICALLY SIGNED 05/25/2014 15:55

## 2014-11-28 NOTE — Consult Note (Signed)
Note Type Consult   Subjective: Chief Complaint/Diagnosis:   Anemia and thrombocytopenia. HPI:   Patient admitted to the hospital after breaking his left hip last week. He has otherwise as felt well. He denies any recent fevers or illnesses.  He denies any easy bleeding or bruising.  He has a good appetite and his weight is stable.  Patient denies any chest pain, shortness of breath, cough, or hemoptysis.  He has no melena or hematochezia.  He denies any urinary complaints.  Patient offers no further specific complaints today.   Review of Systems:  Performance Status (ECOG): 1  Review of Systems:   As per HPI. Otherwise, 10 point system review was negative.  Allergies:  PCN: Swelling  Xylocaine: Hypotension  PFSH: Additional Past Medical and Surgical History: Hypertension, diabetes, cataracts, you fibrillation, arthritis, MRSA, bladder cancer.  Right knee replacement, cataracts, pacemaker.    Family history: Sister with myelofibrosis.    Social history: Patient denies tobacco or alcohol.   Home Medications: Medication Instructions Last Modified Date/Time  sertraline 50 mg tablet 1 tab(s) orally once a day (at bedtime)  01-Oct-15 14:50  Zocor 40 mg tablet 1 tab(s) orally once a day (at bedtime)  01-Oct-15 14:50  Actos 45 mg tablet 1 tab(s) orally once a day  01-Oct-15 14:50  allopurinol 100 mg tablet 1 tab(s) orally once a day  01-Oct-15 14:50  Januvia 50 mg tablet 1 tab(s) orally once a day (at bedtime)  01-Oct-15 14:50  Vitamin B-12 1000 mcg oral tablet 1 tab(s) orally once a day 01-Oct-15 14:50  Coreg 12.5 mg oral tablet 1 tab(s) orally 2 times a day 01-Oct-15 14:50  potassium chloride 20 mEq oral tablet, extended release 1 tab(s) orally once a day 01-Oct-15 14:50  lisinopril 2.5 mg oral tablet 1 tab(s) orally once a day 01-Oct-15 14:50  warfarin 5 mg oral tablet 1 tab(s) orally once a day (in the morning) 01-Oct-15 14:50  ferrous sulfate 325 mg tablet 1 tab(s) orally once a  day 01-Oct-15 14:50  furosemide 40 mg oral tablet 1 tab(s) orally once a day 01-Oct-15 14:50  omeprazole 20 mg oral delayed release capsule 1 cap(s) orally once a day 01-Oct-15 14:50  Astepro 205.5 mcg/inh nasal spray 1 spray(s) nasal once a day (in the morning) 01-Oct-15 14:50   Vital Signs:  :: vital signs stable, patient is afebrile   Physical Exam:  General: well developed, well nourished, and in no acute distress  Mental Status: normal affect  Eyes: anicteric sclera  Respiratory: clear to auscultation bilaterally, anterior exam only  Cardiovascular: regular rate and rhythm, no murmur, rub, or gallop  Gastrointestinal: soft, normal active bowel sounds  Musculoskeletal: No edema, left leg in traction.  Skin: No rash or petechiae noted  Neurological: alert, answering all questions appropriately.  Cranial nerves grossly intact   Laboratory Results: Routine Chem:  06-Oct-15 04:48   Glucose, Serum  123  BUN  73  Creatinine (comp)  2.69  Sodium, Serum  135  Potassium, Serum 4.4  Chloride, Serum 106  CO2, Serum 23  Calcium (Total), Serum  8.2  Anion Gap  6  Osmolality (calc) 293  eGFR (African American)  29  eGFR (Non-African American)  24 (eGFR values <65m/min/1.73 m2 may be an indication of chronic kidney disease (CKD). Calculated eGFR, using the MRDR Study equation, is useful in  patients with stable renal function. The eGFR calculation will not be reliable in acutely ill patients when serum creatinine is changing rapidly. It is  not useful in patients on dialysis. The eGFR calculation may not be applicable to patients at the low and high extremes of body sizes, pregnant women, and vetetarians.)  Result Comment platelet - platelet clumping in specimen.  - result reported from [facility].  Result(s) reported on 12 May 2014 at 08:09AM.  Routine Coag:  06-Oct-15 12:03   Prothrombin  17.8  INR 1.5 (INR reference interval applies to patients on anticoagulant therapy. A  single INR therapeutic range for coumarins is not optimal for all indications; however, the suggested range for most indications is 2.0 - 3.0. Exceptions to the INR Reference Range may include: Prosthetic heart valves, acute myocardial infarction, prevention of myocardial infarction, and combinations of aspirin and anticoagulant. The need for a higher or lower target INR must be assessed individually. Reference: The Pharmacology and Management of the Vitamin K  antagonists: the seventh ACCP Conference on Antithrombotic and Thrombolytic Therapy. EOFHQ.1975 Sept:126 (3suppl): N9146842. A HCT value >55% may artifactually increase the PT.  In one study,  the increase was an average of 25%. Reference:  "Effect on Routine and Special Coagulation Testing Values of Citrate Anticoagulant Adjustment in Patients with High HCT Values." American Journal of Clinical Pathology 2006;126:400-405.)  Routine Hem:  06-Oct-15 04:48   WBC (CBC) 6.5  RBC (CBC)  2.34  Hemoglobin (CBC)  7.6  Hematocrit (CBC)  23.9  Platelet Count (CBC)  91  MCV  102  MCH 32.6  MCHC  31.9  RDW 14.3  Neutrophil % 71.7  Lymphocyte % 12.2  Monocyte % 14.4  Eosinophil % 1.5  Basophil % 0.2  Neutrophil # 4.7  Lymphocyte #  0.8  Monocyte # 0.9  Eosinophil # 0.1  Basophil # 0.0   Lab Results Review:  Lab Results     Assessment and Plan: Impression:   Anemia and thrombocytopenia. Plan:   1.  Anemia:  Patient's hbg is below his baseline and received packed red blood cells earlier today.  Maintain hbg greater than 8.0.  Will check iron store for completeness.  Given his renal insufficiency, he may also benefit from Procrit.  He may have underlying MDS, but this would taking a BMBx to diagnose which is not it necessary at this point.  However, his marrow can be sent to pathology for evaluation if possible from his pending surgery for his hip.Thrombocytopenia: Decreased, but stable and approximately at his baseline.  Possible MDS as above. No intervention needed.  Patient does not require a transfusion. Elevated INR: Patient on coumadin for a-fib.  Given vitamin K.  INR currently 1.5 and trending down.  Monitor. consult, will follow.  Fax to Physician:  Physicians To Recieve Fax: Jarome Lamas, MD - [phone] Murlean Iba - [phone].  Electronic Signatures: Delight Hoh (MD)  (Signed 06-Oct-15 21:01)  Authored: Note Type, CC/HPI, Review of Systems, ALLERGIES, Patient Family Social History, HOME MEDICATIONS, Vital Signs, Physical Exam, Lab Results Review, Assessment and Plan, Fax to Physician   Last Updated: 06-Oct-15 21:01 by Delight Hoh (MD)

## 2014-11-28 NOTE — Consult Note (Signed)
Chief Complaint:  Subjective/Chief Complaint The patient states hip pain improve on pain medicines denies any chest pain or shortness of breath.   VITAL SIGNS/ANCILLARY NOTES: **Vital Signs.:   02-Oct-15 13:09  Vital Signs Type Routine  Temperature Temperature (F) 98.5  Celsius 36.9  Temperature Source oral  Pulse Pulse 66  Pulse source if not from Vital Sign Device per Telemetry Clerk  Respirations Respirations 18  Systolic BP Systolic BP 580  Diastolic BP (mmHg) Diastolic BP (mmHg) 62  Mean BP 78  Pulse Ox % Pulse Ox % 92  Pulse Ox Activity Level  At rest  Oxygen Delivery 2L  Telemetry pattern Cardiac Rhythm Paced rhythm; pattern reported by Telemetry Clerk  *Intake and Output.:   02-Oct-15 13:01  Grand Totals Intake:  900 Output:      Net:  900 19 Hr.:  900  IV (Primary)      In:  900   Brief Assessment:  GEN well developed, well nourished, no acute distress   Cardiac Irregular  murmur present  --Rub   Respiratory normal resp effort  clear BS  rhonchi   Gastrointestinal Normal   Gastrointestinal details normal Soft  Nontender  Nondistended  No masses palpable   EXTR positive cyanosis/clubbing, negative cyanosis/clubbing, negative edema, fracture left hip   Lab Results: Thyroid:  02-Oct-15 04:44   Thyroid Stimulating Hormone 2.27 (0.45-4.50 (IU = International Unit)  ----------------------- Pregnant patients have  different reference  ranges for TSH:  - - - - - - - - - -  Pregnant, first trimetser:  0.36 - 2.50 uIU/mL)  Routine Chem:  02-Oct-15 04:44   Glucose, Serum  109  BUN  40  Creatinine (comp)  2.00  Sodium, Serum 140  Potassium, Serum 4.8  Chloride, Serum  110  CO2, Serum 24  Calcium (Total), Serum 8.8  Anion Gap  6  Osmolality (calc) 290  eGFR (African American)  40  eGFR (Non-African American)  33 (eGFR values <48m/min/1.73 m2 may be an indication of chronic kidney disease (CKD). Calculated eGFR, using the MRDR Study equation, is  useful in  patients with stable renal function. The eGFR calculation will not be reliable in acutely ill patients when serum creatinine is changing rapidly. It is not useful in patients on dialysis. The eGFR calculation may not be applicable to patients at the low and high extremes of body sizes, pregnant women, and vetetarians.)  Magnesium, Serum  1.7 (1.8-2.4 THERAPEUTIC RANGE: 4-7 mg/dL TOXIC: > 10 mg/dL  -----------------------)  Hemoglobin A1c ([facility])  6.6 (The American Diabetes Association recommends that a primary goal of therapy should be <7% and that physicians should reevaluate the treatment regimen in patients with HbA1c values consistently >8%.)  Routine Coag:  02-Oct-15 04:44   Prothrombin  29.8  INR 2.9 (INR reference interval applies to patients on anticoagulant therapy. A single INR therapeutic range for coumarins is not optimal for all indications; however, the suggested range for most indications is 2.0 - 3.0. Exceptions to the INR Reference Range may include: Prosthetic heart valves, acute myocardial infarction, prevention of myocardial infarction, and combinations of aspirin and anticoagulant. The need for a higher or lower target INR must be assessed individually. Reference: The Pharmacology and Management of the Vitamin K  antagonists: the seventh ACCP Conference on Antithrombotic and Thrombolytic Therapy. CDXIPJ.8250Sept:126 (3suppl): [id number] A HCT value >55% may artifactually increase the PT.  In one study,  the increase was an average of 25%. Reference:  "Effect on Routine and Special  Coagulation Testing Values of Citrate Anticoagulant Adjustment in Patients with High HCT Values." American Journal of Clinical Pathology 2006;126:400-405.)  Routine Hem:  02-Oct-15 04:44   WBC (CBC) 5.4  RBC (CBC)  2.85  Hemoglobin (CBC)  9.2  Hematocrit (CBC)  29.4  Platelet Count (CBC)  64  MCV  103  MCH 32.3  MCHC  31.3  RDW  14.8  Neutrophil % 66.8  Lymphocyte  % 17.1  Monocyte % 14.1  Eosinophil % 1.7  Basophil % 0.3  Neutrophil # 3.6  Lymphocyte #  0.9  Monocyte # 0.8  Eosinophil # 0.1  Basophil # 0.0 (Result(s) reported on 08 May 2014 at 05:27AM.)   Radiology Results: XRay:    01-Oct-15 14:06, Chest 1 View AP or PA  Chest 1 View AP or PA   REASON FOR EXAM:    admit fall  COMMENTS:       PROCEDURE: DXR - DXR CHEST 1 VIEWAP OR PA  - May 07 2014  2:06PM     CLINICAL DATA:  Pain post fall    EXAM:  CHEST - 1 VIEW    COMPARISON:  March 04, 2013    FINDINGS:  There is stable cardiomegaly. There is a questionable nipple shadow  at the left base. The interstitium is mildly prominent consistent  with chronic inflammatory type change. There is no frank edema or  consolidation. Pulmonary vascularity appears unremarkable. There is  atherosclerotic change in aorta. There is a pacemaker with lead tips  attached to the right atrium and right ventricle. There is a  prosthetic aortic valve. There is no adenopathy. Bones are  osteoporotic. No focal bone lesions are identified. No pneumothorax.     IMPRESSION:  Cardiomegaly. Probable chronic inflammatory change in the lungs. No  frank edema or consolidation. Probable nipple shadow left base ;  advise repeat study with nipple markers to confirm when patient is  able.      Electronically Signed    By: Lowella Grip M.D.    On: 05/07/2014 14:14         Verified By: Leafy Kindle. WOODRUFF, M.D.,    01-Oct-15 14:06, Hip Left Complete  Hip Left Complete   REASON FOR EXAM:    fall, painful  COMMENTS:       PROCEDURE: DXR - DXR HIP LEFT COMPLETE  - May 07 2014  2:06PM     CLINICAL DATA:  Pain following fall; fall due to apparent buckling  of the knee    EXAM:  LEFT HIP - COMPLETE 2+ VIEW    COMPARISON:March 13, 2014    FINDINGS:  Frontal pelvis as well as frontal and lateral left hip images were  obtained. There is a basicervical region the femoral neck fracture  on the left with  varus angulation at the fracture site. No other  fracture. No dislocation. There is moderate narrowing of both hip  joints. Bones are diffusely osteoporotic. There are foci of arterial  vascular calcification bilaterally.     IMPRESSION:  Basicervical region fracture of the left femoral neck with varus  angulation at the fracture site. Bones diffusely osteoporotic.  Moderate symmetric narrowing of both hip joints. No dislocation.      Electronically Signed    By: Lowella Grip M.D.    On: 05/07/2014 14:12     Verified By: Leafy Kindle. WOODRUFF, M.D.,  Cardiology:    01-Oct-15 13:19, ECG  Ventricular Rate 65  Atrial Rate 63  QRS Duration  192  QT 510  QTc 530  R Axis -80  T Axis 70  ECG interpretation   *** Poor data quality, interpretation may be adversely affected  Statement Not Found (#296)  Abnormal ECG  When compared with ECG of 04-Mar-2013 09:12,  Current undetermined rhythm precludes rhythm comparison, needs review  ----------unconfirmed----------  Confirmed by OVERREAD, NOT ([id number]), editor PEARSON, BARBARA ([id number]) on 05/07/2014 1:21:14 PM  ECG   CT:    01-Oct-15 13:44, CT Cervical Spine Without Contrast  CT Cervical Spine Without Contrast   REASON FOR EXAM:    fall  COMMENTS:       PROCEDURE: CT  - CT CERVICAL SPINE WO  - May 07 2014  1:44PM     CLINICAL DATA:  Status post fall this morning due to left leg  weakness; no loss of consciousness; history of bladder and skin  malignancy    EXAM:  CT HEAD WITHOUT CONTRAST    CT CERVICAL SPINE WITHOUT CONTRAST    TECHNIQUE:  Multidetector CT imaging of the head and cervical spine was  performed following the standard protocol without intravenous  contrast. Multiplanar CT image reconstructions of the cervical spine  were also generated.    COMPARISON:  Noncontrast CT scan of the brain of January 29, 2009    FINDINGS:  CT HEAD FINDINGS    There is mild age appropriate diffuse cerebral and cerebellar  atrophy with  compensatory ventriculomegaly. There is no intracranial  hemorrhage nor intracranial mass effect. There are stable basal  ganglia calcifications on the right. There is no acute ischemic  change. The cerebellum and brainstem are unremarkable.  The observed paranasal sinuses and mastoid air cells are clear.  There is no acute skull fracture. There is marked postsurgical  thinning of the scalp over the midline and left frontal regions.    CT CERVICAL SPINE FINDINGS    The cervical vertebral bodies are preserved in height. There is disc  space narrowing at C5-6 and at C6-7. There are small anterior and  posterior endplate osteophytes here. There is multilevel facet joint  hypertrophy bilaterally. The prevertebral soft tissue spaces are  normal. There is degenerative change of the atlanto dens  articulation. The observed portions of the first and second ribs are  unremarkable. The soft tissues of the neck exhibit no acute  abnormalities.   IMPRESSION:  1. There is no acute intracranial hemorrhage nor evidence of acute  ischemic change.  2. There is no acute skull fracture.  3. There is no acute cervical spine fracture nor dislocation. There  is degenerative disc and facet joint change as described.      Electronically Signed    By: Shaye  Martinique    On: 05/07/2014 13:56         Verified By: Yosgart A. Martinique, M.D., MD    01-Oct-15 13:44, CT Head Without Contrast  CT Head Without Contrast   REASON FOR EXAM:    fall  COMMENTS:       PROCEDURE: CT  - CT HEAD WITHOUT CONTRAST  - May 07 2014  1:44PM     CLINICAL DATA:  Status post fall this morning due to left leg  weakness; no loss of consciousness; history of bladder and skin  malignancy    EXAM:  CT HEAD WITHOUT CONTRAST    CT CERVICAL SPINE WITHOUT CONTRAST    TECHNIQUE:  Multidetector CT imaging of the head and cervical spine was  performed following the standard protocol  without intravenous  contrast. Multiplanar CT image  reconstructions of the cervical spine  were also generated.    COMPARISON:  Noncontrast CT scan of the brain of January 29, 2009    FINDINGS:  CT HEAD FINDINGS    There is mild age appropriate diffuse cerebral and cerebellar  atrophy with compensatory ventriculomegaly. There is no intracranial  hemorrhage nor intracranial mass effect. There are stable basal  ganglia calcifications on the right. There is no acute ischemic  change. The cerebellum and brainstem are unremarkable.  The observed paranasal sinuses and mastoid air cells are clear.  There is no acute skull fracture. There is marked postsurgical  thinning of the scalp over the midline and left frontal regions.    CT CERVICAL SPINE FINDINGS    The cervical vertebral bodies are preserved in height. There is disc  space narrowing at C5-6 and at C6-7. There are small anterior and  posterior endplate osteophytes here. There is multilevel facet joint  hypertrophy bilaterally. The prevertebral soft tissue spaces are  normal. There is degenerative change of the atlanto dens  articulation. The observed portions of the first and second ribs are  unremarkable. The soft tissues of the neck exhibit no acute  abnormalities.   IMPRESSION:  1. There is no acute intracranial hemorrhage nor evidence of acute  ischemic change.  2. There is no acute skull fracture.  3. There is no acute cervical spine fracture nor dislocation. There  is degenerative disc and facet joint change as described.      Electronically Signed    By: Aundrey  Martinique    On: 10/01/201513:56         Verified By: Vernel A. Martinique, M.D., MD   Assessment/Plan:  Assessment/Plan:  Assessment IMP  preop for left hip surgery  atrial fibrillation rate controlled  coagulopathy secondary to Coumadin Hypertension Aortic stenosis CRI .   Plan PLAN Continue pain control Hold coumadin until INR below 1.3 Surgery on hip when INR below 1.3 Continue rate control Bp  control Agree with Medical therapy Rec PT/OT Renal insuff w/u as per nephrology Conservative medical therapy   Electronic Signatures: Yolonda Kida (MD)  (Signed 02-Oct-15 14:56)  Authored: Chief Complaint, VITAL SIGNS/ANCILLARY NOTES, Brief Assessment, Lab Results, Radiology Results, Assessment/Plan   Last Updated: 02-Oct-15 14:56 by Lujean Amel D (MD)

## 2014-11-28 NOTE — Consult Note (Signed)
PATIENT NAME:  [name], [name] MR#:  [id number] DATE OF BIRTH:  [birth year]  DATE OF CONSULTATION:  05/07/2014  REFERRING PHYSICIAN:   CONSULTING PHYSICIAN:  William Record. [name]., MD  REQUESTING PHYSICIAN: Dr. Demetrios Zamora.    CHIEF COMPLAINT: Left hip pain.   HISTORY OF PRESENT ILLNESS: The patient is a 81 year old male with history of spinal stenosis, who reported having some relative weakness in the legs and the knees giving way causing him to fall. He was unable to stand or bear weight due to the onset of left hip pain. He denied any loss of consciousness. He denied any other injuries. He denied any syncopal episode and states that the legs just "gave way."   PAST MEDICAL HISTORY: Hypertension, diabetes, chronic kidney disease, stage III chronic atrial fibrillation (on Coumadin) bladder cancer, pulmonary hypertension, congestive heart failure with systolic dysfunction, sick sinus syndrome, anemia of chronic disease, spinal stenosis.   PAST SURGICAL HISTORY:  Right total knee arthroplasty and subsequent right total knee revision arthroplasty, bilateral cataract surgery, pacemaker placement, aortic valve replacement, skin cancer resection of the scalp, bladder cancer resection.   ALLERGIES: PENICILLIN AND XYLOCAINE.   CURRENT MEDICATIONS: Tylenol 650 mg q. 4 hours Zamora.r.n., allopurinol 100 mg daily, Astelin nasal spray 1 spray to both nostrils q. 12 hours, Dulcolax suppository Zamora.r.n., Coreg 12.5 mg b.i.d., ferrous sulfate 325 mg daily, Lasix 40 mg daily, insulin sliding scale, lisinopril 2.5 mg daily, milk of magnesia Zamora.r.n., morphine 2-4 mg IV q. 4 hours Zamora.r.n. severe pain, Zofran 4 mg q. 4 hours Zamora.r.n. nausea, oxycodone 5-10 mg Zamora.o. q. 4 hours Zamora.r.n. pain, Protonix 40 mg daily, potassium chloride ER 20 mEq daily, Senokot b.i.d. Zamora.r.n., Zoloft 50 mg at bedtime, Zocor 40 mg at bedtime, tramadol 50-100 mg q. 4 hours Zamora.r.n.   SOCIAL HISTORY: The patient denies any current tobacco or alcohol use.   FAMILY  HISTORY: Father died of heart attack. Sister has history of myelofibrosis.   REVIEW OF SYSTEMS: The patient denies any fevers or chills. Musculoskeletal examination is pertinent for left hip pain. No knee pain. Does have a history of spinal stenosis with lower extremity neurogenic claudication and has received epidural steroid injections.   PHYSICAL EXAMINATION:  GENERAL: Well-developed, well-nourished male seen in no acute distress, lying in the bed. Buck traction is intact to the left lower extremity.  HEENT: Atraumatic, normocephalic. Sclerae are clear. Extraocular motions intact. Oropharynx is clear with moist mucosa.  NECK: Supple, nontender, with good range of motion.  LUNGS: Clear to auscultation bilaterally.  CARDIAC: Regular rate and rhythm with normal S1, S2. No appreciable murmurs, gallops, or rubs.  ABDOMEN: Soft, nontender, nondistended. Bowel sounds are present.  MUSCULOSKELETAL: Good range of motion and strength in the upper extremities. No gross tenderness to the shoulders, elbows, forearms, wrists. Examination of the lower extremities notable for that of the left lower extremity. Pain is elicited with any attempt at range of motion of the left hip. The left lower extremity is shortened and rotated. No gross bruising to the hip. No significant swelling to the thigh. The knee is nontender to palpation without effusion.  NEUROLOGIC: Awake, alert, oriented. Sensory function is grossly intact throughout. Motor strength is felt to be 5/5 with the exception of the left lower extremity which was not assessed secondary to the injury.   X-RAYS: Radiographs of the left hip from [facility] were reviewed. There is a displaced basilar neck fracture. Diffuse osteopenia is noted.   IMPRESSION: Displaced left femoral neck  fracture.   PLAN: Findings were discussed in detail with the patient. Recommendation was made for left hip hemiarthroplasty. Risks and benefits of surgical intervention were discussed.  The patient expressed understanding of the risks, benefits, and agreed with plans for surgical intervention.  The patient has been on Coumadin and his INR is 2.6. I have discussed the patient's status with anesthesiology and they would like to target an INR of 1.3 for surgery. I will discuss the possibility of careful reversal with vitamin K with internal medicine. Cardiology consult is still pending.     ____________________________ William Record. [name]., MD jph:bu D: 05/07/2014 20:30:48 ET T: 05/07/2014 20:57:46 ET JOB#: [id number]  cc: William Record. [name]., MD, <Dictator> [name] William Bouche MD ELECTRONICALLY SIGNED 05/07/2014 23:15

## 2014-11-28 NOTE — H&P (Signed)
PATIENT NAME:  [name], MAZER MR#:  [id number] DATE OF BIRTH:  [birth year]  DATE OF ADMISSION:  05/07/2014  PRIMARY CARE PHYSICIAN: Dr. Kary Kos.   REFERRING PHYSICIAN: Dr. Kerman Passey.   CHIEF COMPLAINT: Fall and left hip pain.   HISTORY OF PRESENT ILLNESS: A 81 year old Caucasian male with a history of hypertension, diabetes, CHF, systolic dysfunction, CKD, who was sent from nursing home to ED due to a fall today. The patient is alert, awake, oriented, in no acute distress. The patient said that he fell on accident while walking with a cane today. He said that he feels a little bit dizzy but denies any headache, fever or chills. No syncope, loss of consciousness, or seizure. After the fall, the patient feels left hip pain, unable to move left lower extremity. The patient denies any other symptoms. The patient is on Coumadin for Afib but he denies any dysuria, hematuria, melena, or bloody stool.   PAST MEDICAL HISTORY: Hypertension, diabetes, CKD stage III, chronic Afib on Coumadin, history of bladder cancer status post surgery, pulmonary hypertension, CHF with systolic dysfunction, ejection fraction of 40%, aortic valve replacement with bovine valve in 2010, sick sinus syndrome status post pacemaker, and anemia of chronic disease.   PAST SURGICAL HISTORY: Right knee replacement twice, bilateral cataract surgery, pacemaker placement, aortic valve replacement, skin cancer resection from his scalp, bladder cancer resection.  SOCIAL HISTORY: Denies any smoking, drinking, or illicit drugs. He is a nursing home resident.   FAMILY HISTORY: Father died of MI in his 28s. Mother lived to 50 and died from old age. Sister has myelofibrosis.   ALLERGIES: PENICILLIN, XYLOCAINE.   HOME MEDICATIONS: Actos 45 mg p.o. daily, allopurinol 100 mg p.o. daily, Astepro 205.5 mcg inhalation nasal spray one spray once a day, Coreg 12.5 mg p.o. b.i.d., ferrous sulfate 325 mg p.o. daily, Lasix 40 mg p.o. daily, Januvia 50 mg  p.o. at bedtime, lisinopril 2.5 mg p.o. daily, omeprazole 20 mg p.o. daily, potassium 20 mEq once daily, sertraline 50 mg p.o. at bedtime, vitamin B12 at 1000 mcg p.o. daily, warfarin 500 mg p.o. daily, Zocor 40 mg p.o. at bedtime.   REVIEW OF SYSTEMS: CONSTITUTIONAL: The patient denies any fever or chills. No headache but has dizziness.  EYES: No double vision or blurred vision.  EARS, NOSE, THROAT: No postnasal drip, slurred speech, or dysphagia.  CARDIOVASCULAR: No chest pain, palpitation, orthopnea, nocturnal dyspnea. No leg edema.  PULMONARY: No cough, sputum, shortness of breath, or hematemesis.  GASTROINTESTINAL: No abdominal pain, nausea, vomiting, diarrhea. No melena or bloody stool.  GENITOURINARY: No dysuria, hematuria, or incontinence.  SKIN: No rash or jaundice.  NEUROLOGY: No syncope, loss of consciousness, or seizure.  ENDOCRINOLOGY: No polyuria, polydipsia, heat or cold intolerance.  HEMATOLOGIC: No easy bruising or bleeding.   PHYSICAL EXAMINATION: VITAL SIGNS: Temperature 97.9, blood pressure 126/68, pulse 64, O2 saturation 98% on room air.  GENERAL: The patient is alert, awake, oriented, in no acute distress.  HEENT: Pupils round, equally reactive to light and accommodation. Moist oral mucosa. Clear oropharynx. NECK: Supple. No JVD or carotid bruit. No lymphadenopathy. No thyromegaly.  CARDIOVASCULAR: S1, S2, regular rate and rhythm. No murmurs or gallops.  PULMONARY: Bilateral air entry. No wheezing or rales. No use of accessory muscles to breathe.  ABDOMEN: Soft. No distention or tenderness. No organomegaly. Bowel sounds present.  EXTREMITIES: No edema, clubbing, or cyanosis. No calf tenderness. Unable to move left lower extremity. Bilateral pedal pulses present.  SKIN: No rash or jaundice.  NEUROLOGIC: A and O x 3. No focal deficit except left lower extremities. Sensation intact.   LABORATORY DATA: WBC 5.3, hemoglobin 9.8, platelets 70,000. Glucose 135, BUN 51,  creatinine 2.12. The patient's previous creatinine was 2.02. Electrolytes are normal except potassium of 5.4. INR 2.6. Left hip x-ray shows left femur neck fracture. Chest x-ray, cardiomegaly. No frank edema or consolidation. CAT scan of the cervical spine without contrast showed no acute intracranial hemorrhage. No evidence of acute ischemic change. No acute skull fracture. No dislocation of the cervical spine or fracture.  EKG showed ventricular paced rhythm at 65 beats per minute.   IMPRESSION: 1.  Left hip fracture.  2.  Hyperkalemia.  3.  Anemia.  4.  Thrombocytopenia.  5.  Chronic kidney disease stage III.  6.  Chronic systolic congestive heart failure.  7.  Diabetes.  8.  Hypertension.  9.  History of bladder cancer.  10.   Pulmonary hypertension.   PLAN OF TREATMENT:  1.  The patient will be admitted to the medical floor with telemetry monitoring. We will request orthopedic surgeon consult for possible left hip surgery, but the patient has a high risk for surgery due to his multiple medical problems and age. The patient has a history of chronic systolic CHF with chronic Afib on Coumadin. INR is 2.6. We need to hold Coumadin, follow up an INR, and get a cardiology consult from Dr. Saralyn Pilar.  2.  We will continue patient's home medication of lisinopril, Coreg, and Lasix.  3.  For diabetes, we will start a sliding scale, hold p.o. medication.  4.  For CKD, we will monitor the patient's BMP. 5.   For Hyperkalemia, I will give one dose of Kayexalate and follow up a potassium level.   I discussed the patient's condition and plan of treatment with the patient. The patient is DNR status. He has living will paper and his children will bring the paper here.   TIME SPENT: About 58 minutes.    ____________________________ Demetrios Loll, MD qc:at D: 05/07/2014 15:39:34 ET T: 05/07/2014 16:02:46 ET JOB#: [id number]  cc: Demetrios Loll, MD, <Dictator> Demetrios Loll MD ELECTRONICALLY SIGNED 05/08/2014  12:57

## 2014-11-28 NOTE — Consult Note (Signed)
Brief Consult Note: Diagnosis: displaced left femoral neck fracture.   Patient was seen by consultant.   Consult note dictated.   Orders entered.   Comments: The risks and benefits of surgical intervention were discussed in detail with the patient. The patient expressed understanding of the risks and benefits and agreed with plans for surgery.  Surgical site signed as per "right site surgery" protocol.   Anesthesiology wants INR to be 1.3 or lower. Will discuss possibility of reversal with Vitamin K with Medicine.  Electronic Signatures: Dereck Leep (MD)  (Signed 01-Oct-15 20:33)  Authored: Brief Consult Note   Last Updated: 01-Oct-15 20:33 by Dereck Leep (MD)

## 2014-11-28 NOTE — Consult Note (Signed)
Chief Complaint:  Subjective/Chief Complaint Patient states to be doing reasonably well ready for surgery still waiting for his blood to not be so thin   VITAL SIGNS/ANCILLARY NOTES: **Vital Signs.:   05-Oct-15 05:40  Vital Signs Type Routine  Temperature Temperature (F) 98  Celsius 36.6  Pulse Pulse 59  Respirations Respirations 20  Systolic BP Systolic BP 355  Diastolic BP (mmHg) Diastolic BP (mmHg) 68  Mean BP 85  Pulse Ox % Pulse Ox % 96  Pulse Ox Activity Level  At rest  Oxygen Delivery 2L  *Intake and Output.:   Shift 05-Oct-15 07:00  Grand Totals Intake:   Output:  250    Net:  -250 24 Hr.:  -580  Urine ml     Out:  250  Length of Stay Totals Intake:  9741 Output:  6384    Net:  -1303   Brief Assessment:  GEN well developed, well nourished, no acute distress   Cardiac Regular  no murmur   Respiratory normal resp effort   Gastrointestinal Normal   Gastrointestinal details normal Soft  Nontender  Nondistended   EXTR negative cyanosis/clubbing, negative edema   Lab Results: Routine Chem:  05-Oct-15 15:16   Glucose, Serum  132  BUN  64  Creatinine (comp)  2.43  Sodium, Serum 136  Potassium, Serum 4.7  Chloride, Serum 105  CO2, Serum 25  Calcium (Total), Serum 8.8  Anion Gap  6  Osmolality (calc) 292  eGFR (African American)  32  eGFR (Non-African American)  27 (eGFR values <45m/min/1.73 m2 may be an indication of chronic kidney disease (CKD). Calculated eGFR, using the MRDR Study equation, is useful in  patients with stable renal function. The eGFR calculation will not be reliable in acutely ill patients when serum creatinine is changing rapidly. It is not useful in patients on dialysis. The eGFR calculation may not be applicable to patients at the low and high extremes of body sizes, pregnant women, and vetetarians.)  Routine Coag:  05-Oct-15 03:35   Prothrombin  18.0  INR 1.5 (INR reference interval applies to patients on anticoagulant  therapy. A single INR therapeutic range for coumarins is not optimal for all indications; however, the suggested range for most indications is 2.0 - 3.0. Exceptions to the INR Reference Range may include: Prosthetic heart valves, acute myocardial infarction, prevention of myocardial infarction, and combinations of aspirin and anticoagulant. The need for a higher or lower target INR must be assessed individually. Reference: The Pharmacology and Management of the Vitamin K  antagonists: the seventh ACCP Conference on Antithrombotic and Thrombolytic Therapy. CTXMIW.8032Sept:126 (3suppl): [id number] A HCT value >55% may artifactually increase the PT.  In one study,  the increase was an average of 25%. Reference:  "Effect on Routine and Special Coagulation Testing Values of Citrate Anticoagulant Adjustment in Patients with High HCT Values." American Journal of Clinical Pathology 2006;126:400-405.)  Routine Hem:  05-Oct-15 15:16   WBC (CBC) 6.4  RBC (CBC)  2.57  Hemoglobin (CBC)  8.3  Hematocrit (CBC)  26.3  Platelet Count (CBC)  77  MCV  102  MCH 32.4  MCHC  31.7  RDW  14.8  Neutrophil % 79.8  Lymphocyte % 7.6  Monocyte % 11.7  Eosinophil % 0.6  Basophil % 0.3  Neutrophil # 5.1  Lymphocyte #  0.5  Monocyte # 0.8  Eosinophil # 0.0  Basophil # 0.0 (Result(s) reported on 11 May 2014 at 03:30PM.)   Radiology Results: XRay:    01-Oct-15  14:06, Chest 1 View AP or PA  Chest 1 View AP or PA   REASON FOR EXAM:    admit fall  COMMENTS:       PROCEDURE: DXR - DXR CHEST 1 VIEWAP OR PA  - May 07 2014  2:06PM     CLINICAL DATA:  Pain post fall    EXAM:  CHEST - 1 VIEW    COMPARISON:  March 04, 2013    FINDINGS:  There is stable cardiomegaly. There is a questionable nipple shadow  at the left base. The interstitium is mildly prominent consistent  with chronic inflammatory type change. There is no frank edema or  consolidation. Pulmonary vascularity appears unremarkable. There  is  atherosclerotic change in aorta. There is a pacemaker with lead tips  attached to the right atrium and right ventricle. There is a  prosthetic aortic valve. There is no adenopathy. Bones are  osteoporotic. No focal bone lesions are identified. No pneumothorax.     IMPRESSION:  Cardiomegaly. Probable chronic inflammatory change in the lungs. No  frank edema or consolidation. Probable nipple shadow left base ;  advise repeat study with nipple markers to confirm when patient is  able.      Electronically Signed    By: Lowella Grip M.D.    On: 05/07/2014 14:14         Verified By: Leafy Kindle. WOODRUFF, M.D.,    01-Oct-15 14:06, Hip Left Complete  Hip Left Complete   REASON FOR EXAM:    fall, painful  COMMENTS:       PROCEDURE: DXR - DXR HIP LEFT COMPLETE  - May 07 2014  2:06PM     CLINICAL DATA:  Pain following fall; fall due to apparent buckling  of the knee    EXAM:  LEFT HIP - COMPLETE 2+ VIEW    COMPARISON:March 13, 2014    FINDINGS:  Frontal pelvis as well as frontal and lateral left hip images were  obtained. There is a basicervical region the femoral neck fracture  on the left with varus angulation at the fracture site. No other  fracture. No dislocation. There is moderate narrowing of both hip  joints. Bones are diffusely osteoporotic. There are foci of arterial  vascular calcification bilaterally.     IMPRESSION:  Basicervical region fracture of the left femoral neck with varus  angulation at the fracture site. Bones diffusely osteoporotic.  Moderate symmetric narrowing of both hip joints. No dislocation.      Electronically Signed    By: Lowella Grip M.D.    On: 05/07/2014 14:12     Verified By: Leafy Kindle. Jasmine December, M.D.,  Cardiology:    01-Oct-15 13:19, ECG  Ventricular Rate 65  Atrial Rate 63  QRS Duration 192  QT 510  QTc 530  R Axis -80  T Axis 70  ECG interpretation   *** Poor data quality, interpretation may be adversely  affected  Statement Not Found (#296)  Abnormal ECG  When compared with ECG of 04-Mar-2013 09:12,  Current undetermined rhythm precludes rhythm comparison, needs review  ----------unconfirmed----------  Confirmed by OVERREAD, NOT ([id number]), editor PEARSON, BARBARA ([id number]) on 05/07/2014 1:21:14 PM  ECG   CT:    01-Oct-15 13:44, CT Cervical Spine Without Contrast  CT Cervical Spine Without Contrast   REASON FOR EXAM:    fall  COMMENTS:       PROCEDURE: CT  - CT CERVICAL SPINE WO  - May 07 2014  1:44PM  CLINICAL DATA:  Status post fall this morning due to left leg  weakness; no loss of consciousness; history of bladder and skin  malignancy    EXAM:  CT HEAD WITHOUT CONTRAST    CT CERVICAL SPINE WITHOUT CONTRAST    TECHNIQUE:  Multidetector CT imaging of the head and cervical spine was  performed following the standard protocol without intravenous  contrast. Multiplanar CT image reconstructions of the cervical spine  were also generated.    COMPARISON:  Noncontrast CT scan of the brain of January 29, 2009    FINDINGS:  CT HEAD FINDINGS    There is mild age appropriate diffuse cerebral and cerebellar  atrophy with compensatory ventriculomegaly. There is no intracranial  hemorrhage nor intracranial mass effect. There are stable basal  ganglia calcifications on the right. There is no acute ischemic  change. The cerebellum and brainstem are unremarkable.  The observed paranasal sinuses and mastoid air cells are clear.  There is no acute skull fracture. There is marked postsurgical  thinning of the scalp over the midline and left frontal regions.    CT CERVICAL SPINE FINDINGS    The cervical vertebral bodies are preserved in height. There is disc  space narrowing at C5-6 and at C6-7. There are small anterior and  posterior endplate osteophytes here. There is multilevel facet joint  hypertrophy bilaterally. The prevertebral soft tissue spaces are  normal. There is degenerative change  of the atlanto dens  articulation. The observed portions of the first and second ribs are  unremarkable. The soft tissues of the neck exhibit no acute  abnormalities.   IMPRESSION:  1. There is no acute intracranial hemorrhage nor evidence of acute  ischemic change.  2. There is no acute skull fracture.  3. There is no acute cervical spine fracture nor dislocation. There  is degenerative disc and facet joint change as described.      Electronically Signed    By: Bronte  Martinique    On: 05/07/2014 13:56         Verified By: Jermarion A. Martinique, M.D., MD    01-Oct-15 13:44, CT Head Without Contrast  CT Head Without Contrast   REASON FOR EXAM:    fall  COMMENTS:       PROCEDURE: CT  - CT HEAD WITHOUT CONTRAST  - May 07 2014  1:44PM     CLINICAL DATA:  Status post fall this morning due to left leg  weakness; no loss of consciousness; history of bladder and skin  malignancy    EXAM:  CT HEAD WITHOUT CONTRAST    CT CERVICAL SPINE WITHOUT CONTRAST    TECHNIQUE:  Multidetector CT imaging of the head and cervical spine was  performed following the standard protocol without intravenous  contrast. Multiplanar CT image reconstructions of the cervical spine  were also generated.    COMPARISON:  Noncontrast CT scan of the brain of January 29, 2009    FINDINGS:  CT HEAD FINDINGS    There is mild age appropriate diffuse cerebral and cerebellar  atrophy with compensatory ventriculomegaly. There is no intracranial  hemorrhage nor intracranial mass effect. There are stable basal  ganglia calcifications on the right. There is no acute ischemic  change. The cerebellum and brainstem are unremarkable.  The observed paranasal sinuses and mastoid air cells are clear.  There is no acute skull fracture. There is marked postsurgical  thinning of the scalp over the midline and left frontal regions.    CT CERVICAL  SPINE FINDINGS    The cervical vertebral bodies are preserved in height. There is  disc  space narrowing at C5-6 and at C6-7. There are small anterior and  posterior endplate osteophytes here. There is multilevel facet joint  hypertrophy bilaterally. The prevertebral soft tissue spaces are  normal. There is degenerative change of the atlanto dens  articulation. The observed portions of the first and second ribs are  unremarkable. The soft tissues of the neck exhibit no acute  abnormalities.   IMPRESSION:  1. There is no acute intracranial hemorrhage nor evidence of acute  ischemic change.  2. There is no acute skull fracture.  3. There is no acute cervical spine fracture nor dislocation. There  is degenerative disc and facet joint change as described.      Electronically Signed    By: Nachman  Martinique    On: 10/01/201513:56         Verified By: Gideon A. Martinique, M.D., MD   Assessment/Plan:  Invasive Device Daily Assessment of Necessity:  Does the patient currently have any of the following indwelling devices? foley   Indwelling Urinary Catheter continued, requirement due to   Reason to continue Indwelling Urinary Catheter Immobilization due to pelvic/hip fracture or ortho procedure necessitating immobilization   Assessment/Plan:  Assessment IMP  preop for surgery  hip fracture   atrial fibrillation  and hypertension  DJD  aortic valve replacement  coronary artery disease  coagulopathy from Coumadin  chronic renal insufficiency  .   Plan PLAN  chronic renal insufficiency continue to follow-up may need Nephrology input  continue to hold Coumadin  vitamin K  is to correct  coagulopathy   continue rate control for AFib  murmur stable  status post aortic valve replacement  pain control for hip fracture  hip surgery once INR 1.3   Electronic Signatures: Lujean Amel D (MD)  (Signed 05-Oct-15 17:40)  Authored: Chief Complaint, VITAL SIGNS/ANCILLARY NOTES, Brief Assessment, Lab Results, Radiology Results, Assessment/Plan   Last Updated:  05-Oct-15 17:40 by Yolonda Kida (MD)

## 2014-11-28 NOTE — Consult Note (Signed)
Chief Complaint:  Subjective/Chief Complaint Patient denies any leg pain no shortness of breath denies any palpitation feels reasonably well good appetite sleeping well   VITAL SIGNS/ANCILLARY NOTES: **Vital Signs.:   03-Oct-15 13:52  Vital Signs Type Routine  Temperature Temperature (F) 99.5  Celsius 37.5  Temperature Source oral  Pulse Pulse 62  Respirations Respirations 18  Systolic BP Systolic BP 656  Diastolic BP (mmHg) Diastolic BP (mmHg) 70  Mean BP 94  Pulse Ox % Pulse Ox % 98  Pulse Ox Activity Level  At rest  Oxygen Delivery 2L  *Intake and Output.:   03-Oct-15 10:09  Grand Totals Intake:   Output:  200    Net:  -200 24 Hr.:  280  Urine ml     Out:  200  Urinary Method  Foley   Brief Assessment:  GEN well developed, well nourished, no acute distress   Cardiac Irregular  murmur present   Respiratory normal resp effort  clear BS   Gastrointestinal Normal   Gastrointestinal details normal Soft  Nontender  Nondistended   EXTR negative cyanosis/clubbing, negative edema, left leg in traction   Lab Results: Thyroid:  02-Oct-15 04:44   Thyroid Stimulating Hormone 2.27 (0.45-4.50 (IU = International Unit)  ----------------------- Pregnant patients have  different reference  ranges for TSH:  - - - - - - - - - -  Pregnant, first trimetser:  0.36 - 2.50 uIU/mL)  Routine Chem:  02-Oct-15 04:44   Glucose, Serum  109  BUN  40  Creatinine (comp)  2.00  Sodium, Serum 140  Potassium, Serum 4.8  Chloride, Serum  110  CO2, Serum 24  Calcium (Total), Serum 8.8  Anion Gap  6  Osmolality (calc) 290  eGFR (African American)  40  eGFR (Non-African American)  33 (eGFR values <10m/min/1.73 m2 may be an indication of chronic kidney disease (CKD). Calculated eGFR, using the MRDR Study equation, is useful in  patients with stable renal function. The eGFR calculation will not be reliable in acutely ill patients when serum creatinine is changing rapidly. It is not  useful in patients on dialysis. The eGFR calculation may not be applicable to patients at the low and high extremes of body sizes, pregnant women, and vetetarians.)  Magnesium, Serum  1.7 (1.8-2.4 THERAPEUTIC RANGE: 4-7 mg/dL TOXIC: > 10 mg/dL  -----------------------)  Hemoglobin A1c ([facility])  6.6 (The American Diabetes Association recommends that a primary goal of therapy should be <7% and that physicians should reevaluate the treatment regimen in patients with HbA1c values consistently >8%.)  Routine Coag:  02-Oct-15 04:44   Prothrombin  29.8  INR 2.9 (INR reference interval applies to patients on anticoagulant therapy. A single INR therapeutic range for coumarins is not optimal for all indications; however, the suggested range for most indications is 2.0 - 3.0. Exceptions to the INR Reference Range may include: Prosthetic heart valves, acute myocardial infarction, prevention of myocardial infarction, and combinations of aspirin and anticoagulant. The need for a higher or lower target INR must be assessed individually. Reference: The Pharmacology and Management of the Vitamin K  antagonists: the seventh ACCP Conference on Antithrombotic and Thrombolytic Therapy. CCLEXN.1700Sept:126 (3suppl): [id number] A HCT value >55% may artifactually increase the PT.  In one study,  the increase was an average of 25%. Reference:  "Effect on Routine and Special Coagulation Testing Values of Citrate Anticoagulant Adjustment in Patients with High HCT Values." American Journal of Clinical Pathology 2006;126:400-405.)  03-Oct-15 03:07   Prothrombin  29.5  INR 2.9 (INR reference interval applies to patients on anticoagulant therapy. A single INR therapeutic range for coumarins is not optimal for all indications; however, the suggested range for most indications is 2.0 - 3.0. Exceptions to the INR Reference Range may include: Prosthetic heart valves, acute myocardial infarction, prevention of  myocardial infarction, and combinations of aspirin and anticoagulant. The need for a higher or lower target INR must be assessed individually. Reference: The Pharmacology and Management of the Vitamin K  antagonists: the seventh ACCP Conference on Antithrombotic and Thrombolytic Therapy. YPPJK.9326 Sept:126 (3suppl): [id number]. A HCT value >55% may artifactually increase the PT.  In one study,  the increase was an average of 25%. Reference:  "Effect on Routine and Special Coagulation Testing Values of Citrate Anticoagulant Adjustment in Patients with High HCT Values." American Journal of Clinical Pathology 2006;126:400-405.)  Routine Hem:  02-Oct-15 04:44   WBC (CBC) 5.4  RBC (CBC)  2.85  Hemoglobin (CBC)  9.2  Hematocrit (CBC)  29.4  Platelet Count (CBC)  64  MCV  103  MCH 32.3  MCHC  31.3  RDW  14.8  Neutrophil % 66.8  Lymphocyte % 17.1  Monocyte % 14.1  Eosinophil % 1.7  Basophil % 0.3  Neutrophil # 3.6  Lymphocyte #  0.9  Monocyte # 0.8  Eosinophil # 0.1  Basophil # 0.0 (Result(s) reported on 08 May 2014 at 05:27AM.)   Radiology Results: XRay:    01-Oct-15 14:06, Chest 1 View AP or PA  Chest 1 View AP or PA   REASON FOR EXAM:    admit fall  COMMENTS:       PROCEDURE: DXR - DXR CHEST 1 VIEWAP OR PA  - May 07 2014  2:06PM     CLINICAL DATA:  Pain post fall    EXAM:  CHEST - 1 VIEW    COMPARISON:  March 04, 2013    FINDINGS:  There is stable cardiomegaly. There is a questionable nipple shadow  at the left base. The interstitium is mildly prominent consistent  with chronic inflammatory type change. There is no frank edema or  consolidation. Pulmonary vascularity appears unremarkable. There is  atherosclerotic change in aorta. There is a pacemaker with lead tips  attached to the right atrium and right ventricle. There is a  prosthetic aortic valve. There is no adenopathy. Bones are  osteoporotic. No focal bone lesions are identified. No pneumothorax.      IMPRESSION:  Cardiomegaly. Probable chronic inflammatory change in the lungs. No  frank edema or consolidation. Probable nipple shadow left base ;  advise repeat study with nipple markers to confirm when patient is  able.      Electronically Signed    By: Lowella Grip M.D.    On: 05/07/2014 14:14         Verified By: Leafy Kindle. WOODRUFF, M.D.,    01-Oct-15 14:06, Hip Left Complete  Hip Left Complete   REASON FOR EXAM:    fall, painful  COMMENTS:       PROCEDURE: DXR - DXR HIP LEFT COMPLETE  - May 07 2014  2:06PM     CLINICAL DATA:  Pain following fall; fall due to apparent buckling  of the knee    EXAM:  LEFT HIP - COMPLETE 2+ VIEW    COMPARISON:March 13, 2014    FINDINGS:  Frontal pelvis as well as frontal and lateral left hip images were  obtained. There is a basicervical region the femoral neck fracture  on the left with varus angulation at the fracture site. No other  fracture. No dislocation. There is moderate narrowing of both hip  joints. Bones are diffusely osteoporotic. There are foci of arterial  vascular calcification bilaterally.     IMPRESSION:  Basicervical region fracture of the left femoral neck with varus  angulation at the fracture site. Bones diffusely osteoporotic.  Moderate symmetric narrowing of both hip joints. No dislocation.      Electronically Signed    By: Lowella Grip M.D.    On: 05/07/2014 14:12     Verified By: Leafy Kindle. Jasmine December, M.D.,  Cardiology:    01-Oct-15 13:19, ECG  Ventricular Rate 65  Atrial Rate 63  QRS Duration 192  QT 510  QTc 530  R Axis -80  T Axis 70  ECG interpretation   *** Poor data quality, interpretation may be adversely affected  Statement Not Found (#296)  Abnormal ECG  When compared with ECG of 04-Mar-2013 09:12,  Current undetermined rhythm precludes rhythm comparison, needs review  ----------unconfirmed----------  Confirmed by OVERREAD, NOT ([id number]), editor PEARSON, BARBARA ([id number]) on  05/07/2014 1:21:14 PM  ECG   CT:    01-Oct-15 13:44, CT Cervical Spine Without Contrast  CT Cervical Spine Without Contrast   REASON FOR EXAM:    fall  COMMENTS:       PROCEDURE: CT  - CT CERVICAL SPINE WO  - May 07 2014  1:44PM     CLINICAL DATA:  Status post fall this morning due to left leg  weakness; no loss of consciousness; history of bladder and skin  malignancy    EXAM:  CT HEAD WITHOUT CONTRAST    CT CERVICAL SPINE WITHOUT CONTRAST    TECHNIQUE:  Multidetector CT imaging of the head and cervical spine was  performed following the standard protocol without intravenous  contrast. Multiplanar CT image reconstructions of the cervical spine  were also generated.    COMPARISON:  Noncontrast CT scan of the brain of January 29, 2009    FINDINGS:  CT HEAD FINDINGS    There is mild age appropriate diffuse cerebral and cerebellar  atrophy with compensatory ventriculomegaly. There is no intracranial  hemorrhage nor intracranial mass effect. There are stable basal  ganglia calcifications on the right. There is no acute ischemic  change. The cerebellum and brainstem are unremarkable.  The observed paranasal sinuses and mastoid air cells are clear.  There is no acute skull fracture. There is marked postsurgical  thinning of the scalp over the midline and left frontal regions.    CT CERVICAL SPINE FINDINGS    The cervical vertebral bodies are preserved in height. There is disc  space narrowing at C5-6 and at C6-7. There are small anterior and  posterior endplate osteophytes here. There is multilevel facet joint  hypertrophy bilaterally. The prevertebral soft tissue spaces are  normal. There is degenerative change of the atlanto dens  articulation. The observed portions of the first and second ribs are  unremarkable. The soft tissues of the neck exhibit no acute  abnormalities.   IMPRESSION:  1. There is no acute intracranial hemorrhage nor evidence of acute  ischemic  change.  2. There is no acute skull fracture.  3. There is no acute cervical spine fracture nor dislocation. There  is degenerative disc and facet joint change as described.      Electronically Signed    By: Zackari  Martinique    On: 05/07/2014 13:56  Verified By: Elizar A. Martinique, M.D., MD    01-Oct-15 13:44, CT Head Without Contrast  CT Head Without Contrast   REASON FOR EXAM:    fall  COMMENTS:       PROCEDURE: CT  - CT HEAD WITHOUT CONTRAST  - May 07 2014  1:44PM     CLINICAL DATA:  Status post fall this morning due to left leg  weakness; no loss of consciousness; history of bladder and skin  malignancy    EXAM:  CT HEAD WITHOUT CONTRAST    CT CERVICAL SPINE WITHOUT CONTRAST    TECHNIQUE:  Multidetector CT imaging of the head and cervical spine was  performed following the standard protocol without intravenous  contrast. Multiplanar CT image reconstructions of the cervical spine  were also generated.    COMPARISON:  Noncontrast CT scan of the brain of January 29, 2009    FINDINGS:  CT HEAD FINDINGS    There is mild age appropriate diffuse cerebral and cerebellar  atrophy with compensatory ventriculomegaly. There is no intracranial  hemorrhage nor intracranial mass effect. There are stable basal  ganglia calcifications on the right. There is no acute ischemic  change. The cerebellum and brainstem are unremarkable.  The observed paranasal sinuses and mastoid air cells are clear.  There is no acute skull fracture. There is marked postsurgical  thinning of the scalp over the midline and left frontal regions.    CT CERVICAL SPINE FINDINGS    The cervical vertebral bodies are preserved in height. There is disc  space narrowing at C5-6 and at C6-7. There are small anterior and  posterior endplate osteophytes here. There is multilevel facet joint  hypertrophy bilaterally. The prevertebral soft tissue spaces are  normal. There is degenerative change of the atlanto  dens  articulation. The observed portions of the first and second ribs are  unremarkable. The soft tissues of the neck exhibit no acute  abnormalities.   IMPRESSION:  1. There is no acute intracranial hemorrhage nor evidence of acute  ischemic change.  2. There is no acute skull fracture.  3. There is no acute cervical spine fracture nor dislocation. There  is degenerative disc and facet joint change as described.      Electronically Signed    By: Mayo  Martinique    On: 10/01/201513:56         Verified By: Terryl A. Martinique, M.D., MD   Assessment/Plan:  Invasive Device Daily Assessment of Necessity:  Does the patient currently have any of the following indwelling devices? foley   Indwelling Urinary Catheter continued, requirement due to   Reason to continue Indwelling Urinary Catheter Immobilization due to pelvic/hip fracture or ortho procedure necessitating immobilization   Assessment/Plan:  Assessment IMP  atrial fibrillation  coagulopathy  aortic valve replacement  coronary artery disease  chronic renal insufficiency  hypertension  fractured left hip  sick sinus syndrome  type 2 diabetes .   Plan PLAN  continue follow INR preop for surgery  agree with vitamin K to help improve INR back to 1.3 or lower  continue traction for fractured left hip  agree with orthopedic input preop for surgery  continue rate control for atrial fibrillation  continue to hold Coumadin   follow-up renal insufficiency  continue hypertension control  abnormal EKG appears to have paced rhythm  continue diabetes management  proceed with hip surgery once INR less than 1.3 should be an acceptable risk   Electronic Signatures: Lujean Amel D (MD)  (  Signed 05-Oct-15 13:40)  Authored: Chief Complaint, VITAL SIGNS/ANCILLARY NOTES, Brief Assessment, Lab Results, Radiology Results, Assessment/Plan   Last Updated: 05-Oct-15 13:40 by Yolonda Kida (MD)

## 2014-11-28 NOTE — Consult Note (Signed)
PATIENT NAME:  [name], [name] MR#:  [id number] DATE OF BIRTH:  1920-04-11  DATE OF CONSULTATION:  05/07/2014  PRIMARY CARE PHYSICIAN: Irven Easterly. Kary Kos, MD  CONSULTING PHYSICIAN: Demetrios Loll, MD   CONSULTING PHYSICIAN:  Dwayne D. Callwood, MD  INDICATION: Preoperative evaluation for surgery as well as atrial fibrillation.    HISTORY OF PRESENT ILLNESS: The patient is a 81 year old male with history of hypertension, diabetes, congestive heart failure, mostly diastolic dysfunction, chronic renal insufficiency, atrial fibrillation on Coumadin, reportedly is in assisted living and fell and injured his left hip and now requires surgery. He has a coagulopathy because of Coumadin. He did not have any head injury. Still has significant hip pain and denies any significant bleeding and now is preop.   PAST MEDICAL HISTORY: Hypertension, diabetes, renal insufficiency, chronic atrial fibrillation, history of  bladder trouble, mild pulmonary hypertension, diastolic heart failure, cardiomyopathy, ejection fraction about 40%,  history of aortic stenosis in the past with a murmur.    PAST SURGICAL HISTORY: Permanent pacemaker placement for sick sinus syndrome, aortic valve replacement for aortic stenosis with a tissue valve, bladder cancer surgery.   SOCIAL HISTORY: Lives in an assisted living retirement home. No smoking. No drinking and retired, widower.    FAMILY HISTORY: Coronary artery disease, myelofibrosis.  HOME MEDICATIONS: Actos 45 mg a day, allopurinol 100 mg a day,  Astepro 205.5 mcg a day inhaled nasally once a day,  Coreg 12.5 twice a day, iron 325 mg daily, Lasix 40 a day, Januvia 50 mg at bedtime, lisinopril 2.5 a day, omeprazole 20 mg a day, potassium 20 mEq a day, sertraline 50 mg at bedtime, vitamin B12 1000 mcg a day, Coumadin 5 mg daily, Zocor 40 a day.   REVIEW OF SYSTEMS: No blackout spells or syncope. No nausea or vomiting. No fever, no chills, no sweats. No weight loss or weight gain. No  hemoptysis or hematemesis. No bright red blood per rectum. No vision change or hearing change. Denies sputum production. Denies cough. Recently had a fall and had some back and hip pain. Denies any significant urinary symptoms. Has diabetes, but denies any polydipsia or polyuria. No confusion.   PHYSICAL EXAMINATION:  VITAL SIGNS: Blood pressure was 125/70, pulse 65, respiratory of 18, afebrile.   HEENT: Normocephalic, atraumatic. Pupils equal and reactive to light.  NECK EXAM: Supple. No significant JVD, bruits or adenopathy.  LUNGS: Clear to auscultation and percussion. No significant wheezing, rhonchi, or rale.  HEART: Irregularly regular rate of about 65. Systolic ejection murmur at the left sternal border. PMI nondisplaced.  ABDOMEN: Benign.  EXTREMITIES: Left hip shortened and rotated inwardly with good pulses.  NEUROLOGIC: Intact.  SKIN: Normal.   LABORATORY DATA:  White count 5.3, hemoglobin 9.8, platelet count 70,000. Glucose 135, BUN 51, creatinine 2.12, potassium was slightly elevated at 5.4. INR 2.6. X-ray of the hip shows a femoral neck fracture. Chest x-ray, otherwise negative except for cardiomegaly. CT of the head unremarkable. EKG paced rhythm.   IMPRESSION: Fracture of the left hip, preop for surgery; atrial fibrillation, chronic with coagulopathy; mild hypokalemia, anemia, thrombocytopenia, renal insufficiency, chronic congestive diastolic heart failure, compensated; diabetes, hypertension, mild pulmonary hypertension, osteoarthritis.   PLAN: Agree with admit. Agree with orthopedic consult for hip surgery. To wait until his INR is under 1.6. I do not recommend vitamin K at this stage. Hold Coumadin for now. Restart anticoagulation 1 to 2 days after surgery for DVT prophylaxis as well as atrial fibrillation. Treat hyperkalemia. Follow up renal  insufficiency. Consider nephrology consult input. Continue diabetes management and control. Hypertension management should be continued.  Congestive heart failure, cardiomyopathy, continue Coreg therapy. Will discuss the case with Dr. Saralyn Pilar who is his regular cardiologist. Anemia and thrombocytopenia, unclear etiology. May be related to kidney disease and chronic disease. No evidence of bleeding at this stage. May need a GI work-up but for now we will continue to treat the patient medically and conservatively. Will continue to follow the patient closely. Once his INR is at an acceptable level, he should be an acceptable surgical risk for his hip surgery.    ____________________________ Loran Senters Clayborn Bigness, MD [id number] D: 05/08/2014 08:51:16 ET T: 05/08/2014 09:29:53 ET JOB#: [id number]  cc: Dwayne D. Clayborn Bigness, MD, <Dictator> Yolonda Kida MD ELECTRONICALLY SIGNED 06/10/2014 10:50

## 2014-11-28 NOTE — Op Note (Signed)
PATIENT NAME:  [name], [name] MR#:  [id number] DATE OF BIRTH:  07/17/1920  DATE OF PROCEDURE:  05/15/2014  PREOPERATIVE DIAGNOSIS: Displaced left femoral neck fracture.   POSTOPERATIVE DIAGNOSIS: Displaced left femoral neck fracture.   PROCEDURE PERFORMED: Left hip hemiarthroplasty.   SURGEON: Skip Estimable, MD.   ANESTHESIA: General endotracheal.   ESTIMATED BLOOD LOSS: Was 200 mL.   FLUIDS REPLACED: 1 unit of packed red blood cells and 750 mL of crystalloid.   DRAINS: Were 2 medium Hemovac.   IMPLANTS UTILIZED: DePuy size 5 Summit femoral stem (cemented), 12 mm Cementralizer, 53 mm outer diameter Cathcart hip ball, + 5 mm tapered spacer, and a size 4 cement restrictor.   INDICATIONS FOR SURGERY: The patient is a 81 year old male who fell on 05/07/2014 and sustained a displaced left femoral neck fracture. Internal medicine was involved in the patient's preoperative care in an attempt to correct his anticoagulation secondary to Coumadin. Recommendation was made for left hip hemiarthroplasty. After discussion of the risks and benefits of surgical intervention, the patient expressed understanding of the risks and benefits and agreed with plans for surgical intervention.   PROCEDURE IN DETAIL: The patient was brought to the operating room and, after adequate general endotracheal anesthesia was achieved, the patient was placed in right lateral decubitus position. Axillary roll was placed and all bony prominences were well padded. The patient's left hip and leg were cleaned and prepped with alcohol and DuraPrep and draped in the usual sterile fashion. A "timeout" was performed as per usual protocol. A lateral curvilinear incision was made gently curving towards the posterior superior iliac spine. IT band was incised in line with the skin incision. Fibers of the gluteus maximus were split in line. The piriformis tendon was identified, skeletonized, incised at its insertion to the proximal femur and  reflected posteriorly. In a similar fashion, the short external rotators were incised and reflected posteriorly. A T-type posterior capsulotomy was performed. A moderate hemarthrosis was evacuated. A corkscrew device was used to remove the femoral head. The femoral head was measured using calipers and ring gauges and it was felt that a 53 mm diameter was appropriate. The acetabulum was inspected and articular surface was in excellent condition. No bony fragments were present. Pilot hole for proximal femoral canal was created using a high-speed burr. Conical reamer was inserted followed by insertion of serial broaches up to a size 5 broach. This allowed for good purchase. Calcar planer was applied and trial reduction was performed with a 53 mm Cathcart hip ball with a + 5 mm neck length. This allowed for good equalization of limb lengths and excellent stability both anteriorly and posteriorly. Trial components were removed. The femoral canal was sized and it was felt that a size 4 cement restrictor was appropriate. A size 4 cement was inserted to the appropriate depth. Proximal femoral canal was irrigated with copious amounts of normal saline with antibiotic solution using pulsatile lavage and then suctioned dry. The canal was then packed with vaginal packing soaked in dilute Neo-Synephrine. Polymethyl methacrylate cement with gentamicin was prepared in the usual fashion using a vacuum mixer. Vaginal packing was removed and the canal again irrigated and suctioned dry. Cement was introduced in a retrograde fashion and pressurized. A size 5 Summit femoral stem with a 12 mm distal Cementralizer was positioned and impacted into place. Excess cement was removed using freer elevators. After adequate curing of cement, the acetabulum was again irrigated and on inspection felt to be free of debris.  The [facility] taper was cleaned and dried and a 53 mm outer diameter Cathcart hip ball with a + 5 mm neck length was placed on the  trunnion and impacted into place. The hip was then reduced and placed through a range of motion. Good equalization of limb lengths was noted and good stability was appreciated both anteriorly and posteriorly. The wound was irrigated with copious amounts of normal saline with antibiotic solution using pulsatile lavage and suctioned dry. The posterior capsulotomy was repaired using number 5 Ethibond. The piriformis tendon was reapproximated on the undersurface of the gluteus medius then using a number 5 Ethibond. Two medium drains were placed in the wound bed and brought out through a separate stab incision to be attached to a Hemovac reservoir. The IT band was repaired using interrupted sutures of number 1 Vicryl. The subcutaneous tissue was approximated in layers using first number 0 Vicryl followed by number 2-0 Vicryl. Skin was closed with skin staples. Sterile dressing was applied.   The patient tolerated the procedure well. He was transported to the recovery room in stable condition.    ____________________________ Laurice Record. Holley Bouche., MD jph:bu D: 05/15/2014 12:48:00 ET T: 05/15/2014 14:21:48 ET JOB#: [id number]  cc: Jeneen Rinks P. Holley Bouche., MD, <Dictator> Laurice Record Holley Bouche MD ELECTRONICALLY SIGNED 05/17/2014 9:33

## 2014-11-28 NOTE — Consult Note (Signed)
Chief Complaint:  Subjective/Chief Complaint Patient states to feel reasonably well denies any chest pain.  His leg feels okay he sitting up in bed talking  to his son   VITAL SIGNS/ANCILLARY NOTES: **Vital Signs.:   04-Oct-15 07:53  Vital Signs Type Q 4hr  Temperature Temperature (F) 98.9  Celsius 37.1  Temperature Source oral  Pulse Pulse 61  Respirations Respirations 18  Systolic BP Systolic BP 025  Diastolic BP (mmHg) Diastolic BP (mmHg) 55  Mean BP 70  Pulse Ox % Pulse Ox % 97  Pulse Ox Activity Level  At rest  Oxygen Delivery 2L  Telemetry pattern Cardiac Rhythm Paced rhythm  *Intake and Output.:   Shift 04-Oct-15 07:00  Grand Totals Intake:   Output:  100    Net:  -100 24 Hr.:  347  Urine ml     Out:  100  Length of Stay Totals Intake:  2627 Output:  3350    Net:  -723   Brief Assessment:  GEN well developed, well nourished, no acute distress   Cardiac Regular  no murmur   Respiratory normal resp effort   Gastrointestinal Normal   Gastrointestinal details normal Soft  Nontender  Nondistended   EXTR negative cyanosis/clubbing, negative edema   Lab Results: Routine Coag:  03-Oct-15 03:07   Prothrombin  29.5  INR 2.9 (INR reference interval applies to patients on anticoagulant therapy. A single INR therapeutic range for coumarins is not optimal for all indications; however, the suggested range for most indications is 2.0 - 3.0. Exceptions to the INR Reference Range may include: Prosthetic heart valves, acute myocardial infarction, prevention of myocardial infarction, and combinations of aspirin and anticoagulant. The need for a higher or lower target INR must be assessed individually. Reference: The Pharmacology and Management of the Vitamin K  antagonists: the seventh ACCP Conference on Antithrombotic and Thrombolytic Therapy. [id number].2376 Sept:126 (3suppl): N9146842. A HCT value >55% may artifactually increase the PT.  In one study,  the increase was  an average of 25%. Reference:  "Effect on Routine and Special Coagulation Testing Values of Citrate Anticoagulant Adjustment in Patients with High HCT Values." American Journal of Clinical Pathology 2006;126:400-405.)  04-Oct-15 03:09   Prothrombin  20.9  INR 1.8 (INR reference interval applies to patients on anticoagulant therapy. A single INR therapeutic range for coumarins is not optimal for all indications; however, the suggested range for most indications is 2.0 - 3.0. Exceptions to the INR Reference Range may include: Prosthetic heart valves, acute myocardial infarction, prevention of myocardial infarction, and combinations of aspirin and anticoagulant. The need for a higher or lower target INR must be assessed individually. Reference: The Pharmacology and Management of the Vitamin K  antagonists: the seventh ACCP Conference on Antithrombotic and Thrombolytic Therapy. EGBTD.1761 Sept:126 (3suppl): [id number]. A HCT value >55% may artifactually increase the PT.  In one study,  the increase was an average of 25%. Reference:  "Effect on Routine and Special Coagulation Testing Values of Citrate Anticoagulant Adjustment in Patients with High HCT Values." American Journal of Clinical Pathology 2006;126:400-405.)   Radiology Results: XRay:    01-Oct-15 14:06, Chest 1 View AP or PA  Chest 1 View AP or PA   REASON FOR EXAM:    admit fall  COMMENTS:       PROCEDURE: DXR - DXR CHEST 1 VIEWAP OR PA  - May 07 2014  2:06PM     CLINICAL DATA:  Pain post fall    EXAM:  CHEST -  1 VIEW    COMPARISON:  March 04, 2013    FINDINGS:  There is stable cardiomegaly. There is a questionable nipple shadow  at the left base. The interstitium is mildly prominent consistent  with chronic inflammatory type change. There is no frank edema or  consolidation. Pulmonary vascularity appears unremarkable. There is  atherosclerotic change in aorta. There is a pacemaker with lead tips  attached to the  right atrium and right ventricle. There is a  prosthetic aortic valve. There is no adenopathy. Bones are  osteoporotic. No focal bone lesions are identified. No pneumothorax.     IMPRESSION:  Cardiomegaly. Probable chronic inflammatory change in the lungs. No  frank edema or consolidation. Probable nipple shadow left base ;  advise repeat study with nipple markers to confirm when patient is  able.      Electronically Signed    By: Lowella Grip M.D.    On: 05/07/2014 14:14         Verified By: Leafy Kindle. WOODRUFF, M.D.,    01-Oct-15 14:06, Hip Left Complete  Hip Left Complete   REASON FOR EXAM:    fall, painful  COMMENTS:       PROCEDURE: DXR - DXR HIP LEFT COMPLETE  - May 07 2014  2:06PM     CLINICAL DATA:  Pain following fall; fall due to apparent buckling  of the knee    EXAM:  LEFT HIP - COMPLETE 2+ VIEW    COMPARISON:March 13, 2014    FINDINGS:  Frontal pelvis as well as frontal and lateral left hip images were  obtained. There is a basicervical region the femoral neck fracture  on the left with varus angulation at the fracture site. No other  fracture. No dislocation. There is moderate narrowing of both hip  joints. Bones are diffusely osteoporotic. There are foci of arterial  vascular calcification bilaterally.     IMPRESSION:  Basicervical region fracture of the left femoral neck with varus  angulation at the fracture site. Bones diffusely osteoporotic.  Moderate symmetric narrowing of both hip joints. No dislocation.      Electronically Signed    By: Lowella Grip M.D.    On: 05/07/2014 14:12     Verified By: Leafy Kindle. Jasmine December, M.D.,  Cardiology:    01-Oct-15 13:19, ECG  Ventricular Rate 65  Atrial Rate 63  QRS Duration 192  QT 510  QTc 530  R Axis -80  T Axis 70  ECG interpretation   *** Poor data quality, interpretation may be adversely affected  Statement Not Found (#[id number])  Abnormal ECG  When compared with ECG of 04-Mar-2013  09:12,  Current undetermined rhythm precludes rhythm comparison, needs review  ----------unconfirmed----------  Confirmed by OVERREAD, NOT ([id number]), editor PEARSON, BARBARA ([id number]) on 05/07/2014 1:21:14 PM  ECG   CT:    01-Oct-15 13:44, CT Cervical Spine Without Contrast  CT Cervical Spine Without Contrast   REASON FOR EXAM:    fall  COMMENTS:       PROCEDURE: CT  - CT CERVICAL SPINE WO  - May 07 2014  1:44PM     CLINICAL DATA:  Status post fall this morning due to left leg  weakness; no loss of consciousness; history of bladder and skin  malignancy    EXAM:  CT HEAD WITHOUT CONTRAST    CT CERVICAL SPINE WITHOUT CONTRAST    TECHNIQUE:  Multidetector CT imaging of the head and cervical spine was  performed following the standard  protocol without intravenous  contrast. Multiplanar CT image reconstructions of the cervical spine  were also generated.    COMPARISON:  Noncontrast CT scan of the brain of January 29, 2009    FINDINGS:  CT HEAD FINDINGS    There is mild age appropriate diffuse cerebral and cerebellar  atrophy with compensatory ventriculomegaly. There is no intracranial  hemorrhage nor intracranial mass effect. There are stable basal  ganglia calcifications on the right. There is no acute ischemic  change. The cerebellum and brainstem are unremarkable.  The observed paranasal sinuses and mastoid air cells are clear.  There is no acute skull fracture. There is marked postsurgical  thinning of the scalp over the midline and left frontal regions.    CT CERVICAL SPINE FINDINGS    The cervical vertebral bodies are preserved in height. There is disc  space narrowing at C5-6 and at C6-7. There are small anterior and  posterior endplate osteophytes here. There is multilevel facet joint  hypertrophy bilaterally. The prevertebral soft tissue spaces are  normal. There is degenerative change of the atlanto dens  articulation. The observed portions of the first and second ribs  are  unremarkable. The soft tissues of the neck exhibit no acute  abnormalities.   IMPRESSION:  1. There is no acute intracranial hemorrhage nor evidence of acute  ischemic change.  2. There is no acute skull fracture.  3. There is no acute cervical spine fracture nor dislocation. There  is degenerative disc and facet joint change as described.      Electronically Signed    By: Gordy  Martinique    On: 05/07/2014 13:56         Verified By: Luqman A. [place], M.D., MD    01-Oct-15 13:44, CT Head Without Contrast  CT Head Without Contrast   REASON FOR EXAM:    fall  COMMENTS:       PROCEDURE: CT  - CT HEAD WITHOUT CONTRAST  - May 07 2014  1:44PM     CLINICAL DATA:  Status post fall this morning due to left leg  weakness; no loss of consciousness; history of bladder and skin  malignancy    EXAM:  CT HEAD WITHOUT CONTRAST    CT CERVICAL SPINE WITHOUT CONTRAST    TECHNIQUE:  Multidetector CT imaging of the head and cervical spine was  performed following the standard protocol without intravenous  contrast. Multiplanar CT image reconstructions of the cervical spine  were also generated.    COMPARISON:  Noncontrast CT scan of the brain of January 29, 2009    FINDINGS:  CT HEAD FINDINGS    There is mild age appropriate diffuse cerebral and cerebellar  atrophy with compensatory ventriculomegaly. There is no intracranial  hemorrhage nor intracranial mass effect. There are stable basal  ganglia calcifications on the right. There is no acute ischemic  change. The cerebellum and brainstem are unremarkable.  The observed paranasal sinuses and mastoid air cells are clear.  There is no acute skull fracture. There is marked postsurgical  thinning of the scalp over the midline and left frontal regions.    CT CERVICAL SPINE FINDINGS    The cervical vertebral bodies are preserved in height. There is disc  space narrowing at C5-6 and at C6-7. There are small anterior and  posterior  endplate osteophytes here. There is multilevel facet joint  hypertrophy bilaterally. The prevertebral soft tissue spaces are  normal. There is degenerative change of the atlanto dens  articulation. The  observed portions of the first and second ribs are  unremarkable. The soft tissues of the neck exhibit no acute  abnormalities.   IMPRESSION:  1. There is no acute intracranial hemorrhage nor evidence of acute  ischemic change.  2. There is no acute skull fracture.  3. There is no acute cervical spine fracture nor dislocation. There  is degenerative disc and facet joint change as described.      Electronically Signed    By: Kohlton  Martinique    On: 10/01/201513:56         Verified By: Spyridon A. [place], M.D., MD   Assessment/Plan:  Invasive Device Daily Assessment of Necessity:  Does the patient currently have any of the following indwelling devices? foley   Indwelling Urinary Catheter continued, requirement due to   Reason to continue Indwelling Urinary Catheter Immobilization due to pelvic/hip fracture or ortho procedure necessitating immobilization   Assessment/Plan:  Assessment IMP  hip fracture   atrial fibrillation  and hypertension  DJD  aortic valve replacement  coronary artery disease  coagulopathy from Coumadin  .   Plan PLAN  continue to hold Coumadin  vitamin K  is to correct  coagulopathy   continue rate control for AFib  murmur stable  status post aortic valve replacement  pain control for hip fracture  hip surgery once INR 1.3   Electronic Signatures: Lujean Amel D (MD)  (Signed 04-Oct-15 12:57)  Authored: Chief Complaint, VITAL SIGNS/ANCILLARY NOTES, Brief Assessment, Lab Results, Radiology Results, Assessment/Plan   Last Updated: 04-Oct-15 12:57 by Lujean Amel D (MD)

## 2014-12-06 NOTE — Consult Note (Signed)
Chief Complaint:  Subjective/Chief Complaint Patient states to be doing reasonably well denies leg swelling no pain no significant chest pain no significant worsening weakness.   VITAL SIGNS/ANCILLARY NOTES: **Vital Signs.:   21-Mar-16 06:36  Vital Signs Type Routine  Temperature Temperature (F) 97.6  Celsius 36.4  Temperature Source oral  Pulse Pulse 68  Respirations Respirations 20  Systolic BP Systolic BP 702  Diastolic BP (mmHg) Diastolic BP (mmHg) 54  Mean BP 74  Pulse Ox % Pulse Ox % 93  Pulse Ox Activity Level  At rest  Oxygen Delivery Room Air/ 21 %  *Intake and Output.:   21-Mar-16 06:36  Grand Totals Intake:   Output:  100    Net:  -100 24 Hr.:  -1750  Weight Type daily  Weight Method Bed  Current Weight (lbs) (lbs) 203.4  Current Weight (kg) (kg) 92.2  Height (ft) (feet) 6  Height (in) (in) 0  Height (cm) centimeters 182.8  BSA (m2) 2.1  BMI (kg/m2) 27.5  Urine ml     Out:  100  Urinary Method  Void; Urinal    Shift 07:00  Urine ml     Out:  400  Length of Stay Totals Intake:  460 Output:  4520    Net:  -4060   Brief Assessment:  GEN well developed, well nourished, no acute distress   Cardiac Regular  murmur present  -- JVD   Respiratory normal resp effort   Gastrointestinal Normal   Gastrointestinal details normal Soft  Nontender  Nondistended  No masses palpable   EXTR positive edema   Lab Results: Routine Chem:  21-Mar-16 04:13   Glucose, Serum  115 (65-99 NOTE: New Reference Range  09/29/14)  BUN  79 (6-20 NOTE: New Reference Range  09/29/14)  Creatinine (comp)  2.75 (0.61-1.24 NOTE: New Reference Range  09/29/14)  Sodium, Serum 138 (135-145 NOTE: New Reference Range  09/29/14)  Potassium, Serum 3.7 (3.5-5.1 NOTE: New Reference Range  09/29/14)  Chloride, Serum 107 (101-111 NOTE: New Reference Range  09/29/14)  CO2, Serum  21 (22-32 NOTE: New Reference Range  09/29/14)  Calcium (Total), Serum  8.8 (8.9-10.3 NOTE: New  Reference Range  09/29/14)  Anion Gap 10  eGFR (African American)  22  eGFR (Non-African American)  19 (eGFR values <88m/min/1.73 m2 may be an indication of chronic kidney disease (CKD). Calculated eGFR is useful in patients with stable renal function. The eGFR calculation will not be reliable in acutely ill patients when serum creatinine is changing rapidly. It is not useful in patients on dialysis. The eGFR calculation may not be applicable to patients at the low and high extremes of body sizes, pregnant women, and vegetarians.)  Routine Coag:  21-Mar-16 04:13   Prothrombin  30.7 (11.4-15.0 NOTE: New Reference Range  09/04/14)  INR 2.9 (INR reference interval applies to patients on anticoagulant therapy. A single INR therapeutic range for coumarins is not optimal for all indications; however, the suggested range for most indications is 2.0 - 3.0. Exceptions to the INR Reference Range may include: Prosthetic heart valves, acute myocardial infarction, prevention of myocardial infarction, and combinations of aspirin and anticoagulant. The need for a higher or lower target INR must be assessed individually. Reference: The Pharmacology and Management of the Vitamin K  antagonists: the seventh ACCP Conference on Antithrombotic and Thrombolytic Therapy. COVZCH.8850Sept:126 (3suppl): [id number] A HCT value >55% may artifactually increase the PT.  In one study,  the increase was an average of 25%. Reference:  "  Effect on Routine and Special Coagulation Testing Values of Citrate Anticoagulant Adjustment in Patients with High HCT Values." American Journal of Clinical Pathology 2006;126:400-405.)  Routine Hem:  21-Mar-16 04:13   WBC (CBC) 4.4  RBC (CBC)  2.49  Hemoglobin (CBC)  8.2  Hematocrit (CBC)  25.3  Platelet Count (CBC)  38  MCV  101  MCH 32.8  MCHC 32.4  RDW  16.7  Neutrophil % 67.9  Lymphocyte % 14.5  Monocyte % 14.1  Eosinophil % 3.1  Basophil % 0.4  Neutrophil # 3.0   Lymphocyte #  0.6  Monocyte # 0.6  Eosinophil # 0.1  Basophil # 0.0 (Result(s) reported on 26 Oct 2014 at 05:50AM.)   Radiology Results: Cardiology:    18-Mar-16 12:54, Echo Doppler  Echo Doppler   REASON FOR EXAM:      COMMENTS:       PROCEDURE: [facility] - ECHO DOPPLER COMPLETE(TRANSTHOR)  - Oct 23 2014 12:54PM     RESULT: Echocardiogram Report    Patient Name:   [name] Date of Exam: 10/23/2014  Medical Rec #:  [id number]       Custom1:  Date of Birth:  [birth year]    Height:       74.0 in  Patient Age:    81 years     Weight:       205.0 lb  Patient Gender: M            BSA:          2.20 m??    Indications: SOB  Sonographer:    Sherrie Sport RDCS  Referring Phys: Nicholes Mango    Summary:   1. Leftventricular ejection fraction, by visual estimation, is 45 to   50%.   2. Normal global left ventricular systolic function.   3. Right ventricular volume overload.   4. Moderately dilated left atrium.   5. Mildly dilated right atrium.   6. Mild mitral valve regurgitation.   7. Mild aortic valve stenosis.   8. Moderate to severe tricuspid regurgitation.   9. Moderately elevated pulmonary artery systolic pressure.  10. Mild apical septal hypo.  2D AND M-MODE MEASUREMENTS (normal ranges within parentheses):  Left Ventricle:          Normal  IVSd (2D):      1.30 cm (0.7-1.1)  LVPWd (2D):     1.00 cm (0.7-1.1) Aorta/LA:                  Normal  LVIDd (2D):     4.31 cm (3.4-5.7) Aortic Root (2D): 3.40 cm (2.4-3.7)  LVIDs (2D):     2.96 cmLeft Atrium (2D): 5.00 cm (1.9-4.0)  LV FS (2D):     31.3 %   (>25%)  LV EF (2D):     59.4 %   (>50%)                                    Right Ventricle:                                    RVd (2D):        1.22 cm  LV DIASTOLIC FUNCTION:  MV Peak E: 1.31 m/s E/e' Ratio: 20.40  Decel Time: 225 msec  SPECTRAL DOPPLER ANALYSIS (where applicable):  Mitral Valve:  MV P1/2 Time: 65.25 msec  MV Area, PHT: 3.37 cm??  Aortic Valve: AoV  Max Vel: 2.02 m/s AoV Peak PG: 16.3 mmHg AoV Mean PG:   9.7 mmHg  LVOT Vmax: 0.74 m/s LVOT VTI: 0.177 m LVOT Diameter: 2.00 cm  AoV Area, Vmax: 1.16 cm?? AoV Area, VTI: 1.20 cm?? AoV Area, Vmn: 1.30 cm??  Tricuspid Valve and PA/RV Systolic Pressure: TR Max Velocity: 3.64 m/s RA   Pressure: 5 mmHg RVSP/PASP: 58.0 mmHg  Pulmonic Valve:  PV Max Velocity: 0.91 m/s PV Max PG: 3.3 mmHg PV Mean PG:    PHYSICIAN INTERPRETATION:  Left Ventricle: The left ventricular internal cavity size was normal. LV   septal wall thickness was normal. LV posterior wall thickness was normal.   Global LV systolic function was normal. Left ventricular ejection   fraction, by visual estimation, is 45 to 50%. The interventricular septum     is flattened in diastole ('D' shaped left ventricle), consistent with   right ventricular volume overload.  Right Ventricle: The right ventricular size is mildly enlarged. Global RV   systolic function is low normal.  Left Atrium: The left atrium is moderately dilated.  Right Atrium: The right atrium is mildly dilated.  Pericardium: There is no evidence of pericardial effusion.  Mitral Valve: The mitral valve is normal in structure. Mild mitral valve   regurgitation is seen.  Tricuspid Valve: The tricuspid valve is normal. Moderate to severe   tricuspid regurgitation is visualized. The tricuspid regurgitant velocity   is 3.64 m/s, and with an assumed right atrial pressure of 5 mmHg, the   estimated right ventricular systolic pressure is moderately elevated at   58.0 mmHg.  Aortic Valve: The aortic valve is normal. Mild aortic stenosis is     present. No evidence of aortic valve regurgitation is seen.  Pulmonic Valve: The pulmonic valve is normal. Trace pulmonic valve   regurgitation.    [place] MD  Electronically signed by [place] MD  Signature Date/Time: 10/23/2014/4:20:27 PM    *** Final ***    IMPRESSION: .        Verified By: Yolonda Kida, M.D., MD   Assessment/Plan:  Assessment/Plan:  Assessment IMP  atrial fibrillation chronic congestive heart failure  leg edema  cardiomyopathy  acute on chronic renal insufficiency  hyperlipidemia  atrial fibrillation  obstructive sleep apnea  diabetes  shortness of breath .   Plan continue telemetry Physical therapy  supplemental oxygen  inhalers as necessary  agree with Lasix drip  follow-up with Nephrology for renal insufficiency  DVT prophylaxis  continue therapy for atrial fibrillation  anticoagulation with Coumadin  CPAP as necessary for obstructive sleep apnea  continue chronic therapy for gout  agree with echocardiogram  medical therapy   Electronic Signatures: Lujean Amel D (MD)  (Signed 21-Mar-16 11:01)  Authored: Chief Complaint, VITAL SIGNS/ANCILLARY NOTES, Brief Assessment, Lab Results, Radiology Results, Assessment/Plan   Last Updated: 21-Mar-16 11:01 by Lujean Amel D (MD)

## 2014-12-06 NOTE — Discharge Summary (Signed)
PATIENT NAME:  [name], [name] MR#:  [id number] DATE OF BIRTH:  02-14-1920  DATE OF ADMISSION:  08/10/2014 DATE OF DISCHARGE:  08/13/2014  PRIMARY CARE PHYSICIAN: Irven Easterly. Kary Kos, MD.  CARDIOLOGIST: Isaias Cowman, MD.   FINAL DIAGNOSES:   1. Acute on chronic diastolic congestive heart failure.  2. Hypertension.  3. Atrial fibrillation  4. Diabetes.  5. Sleep apnea.  6. Chronic anemia.  7. Sick sinus syndrome, status post pacemaker.  8. Known insignificant coronary artery disease.   DISCHARGE MEDICATIONS: Warfarin 5 mg daily, Zocor 40 mg at bedtime, carvedilol 12.5 mg b.i.d., lisinopril 2.5 mg daily, furosemide 40 mg daily, sertraline 50 mg at bedtime, Actos 45 mg daily, allopurinol 100 mg daily, Januvia 50 mg at bedtime, vitamin B12 - 1000 mcg daily, potassium chloride 20 mEq daily, ferrous sulfate 325 mg daily, omeprazole 1 capsule daily, Astepro nasal spray every a.m. and tramadol 50 mg every 4 hours p.r.n.   HISTORY OF PRESENT ILLNESS: Please see admission [place] and [place] COURSE: The patient was admitted on 08/10/2014 with acute on chronic diastolic congestive heart failure with peripheral edema, shortness of breath and fluid retention that was refractory to outpatient diuretics. The patient was started on furosemide 40 mg IV every 8 hours with good diuresis and overall clinical improvement. Peripheral edema was much improved. The patient lost approximately 7 to 10 pounds of fluid weight. BUN and creatinine were stable throughout hospitalization. BUN and creatinine were 67 and 2.11, respectively. On the morning of 08/13/2014, the patient was ambulating without difficulty and was discharged home. He is scheduled to see me in follow-up in 1 week.     ____________________________ Isaias Cowman, MD ap:TT D: 08/13/2014 08:43:43 ET T: 08/13/2014 13:48:32 ET JOB#: [id number]  cc: Isaias Cowman, MD, <Dictator> Isaias Cowman MD ELECTRONICALLY SIGNED 08/26/2014  18:05

## 2014-12-06 NOTE — Discharge Summary (Signed)
PATIENT NAME:  [name], [name] MR#:  [id number] DATE OF BIRTH:  11-Nov-1919  DATE OF ADMISSION:  10/23/2014 DATE OF DISCHARGE:  10/28/2014  DISCHARGE DIAGNOSES: 1. Acute diastolic and systolic heart failure.  2. Acute on chronic renal failure.  3. Pancytopenia.  4. Diabetes.  5. Hypertension.   MEDICATIONS ON DISCHARGE:  1. Sertraline 50 mg once a day.  2. Zocor 40 mg once a day.  3. Allopurinol 100 mg once a day.  4. Januvia 50 mg once a day. 5. Vitamin B12 1000 mcg once a day.  6. Carvedilol 12.5 mg 2 times a day.  7. Warfarin 5 mg once a day.  8. Omeprazole 20 mg once a day.  9. Flomax 0.4 mg once a day.  10. Warfarin 2.5 mg on Monday, Wednesday, Friday, and Saturday.  11. Ondansetron 4 mg oral every 8 hours as needed for nausea and vomiting.  12. Acetaminophen and oxycodone 325 plus 5 mg every 4 hours as needed for pain.  13. Colchicine 0.6 mg 2 times a day.  14. Furosemide 40 mg once a day.  15. Furosemide 80 mg once a day in the morning.   HOME HEALTH ON DISCHARGE: Yes. Advised to have physical therapy and nurse and 2 liters oxygen via nasal cannula.  Advised to follow in 1-2 weeks with Dr. Candiss Norse, 1-2 weeks with Cardiology Clinic and 2-4 weeks in [place].    HISTORY OF PRESENTING ILLNESS: A 81 year old male with history of atrial fibrillation and sick sinus syndrome and post pacemaker placement presented with progressive lower extremity swelling and shortness of breath, seen by nephrologist, Dr. Candiss Norse, on the previous day,  sent for direct admission for lower extremity swelling. He denied any complaint of chest pain or fever. He had complaint of dyspnea on exertion.   HOSPITAL COURSE AND STAY:  1. For acute diastolic and systolic congestive heart failure, initially he was started on Lasix drip as he has renal failure so could not give injection Lasix.  On Lasix drip, he had significant improvement and he remained kidney function stable, had significant diuresis, and so we  discharged him.  2. Diabetes. On carbohydrate-controlled diet, sliding scale.  Continued Januvia, but stopped his Actos as it can cause worsening in the edema.  Blood sugar level remained stable in the hospital.  3. Chronic renal failure on IV Lasix drip. There was somewhat improvement in his kidney function gradually.  Nephrologist was following in the hospital.  4. Hyperlipidemia. Continue simvastatin.  5. Chronic atrial fibrillation. Continued Coumadin. INR was therapeutic. Pharmacy was following.  6. Pancytopenia. We called consult with Dr. Grayland Ormond to see him and he ordered Procrit and advised to follow in the office.   CONSULTS [place]: Hematology, Dr. Grayland Ormond, nephrology, Dr. Candiss Norse and Dr. Anthonette Legato, and cardiology Dr. Lujean Amel.  IMPORTANT LABORATORY RESULTS IN THE HOSPITAL: Creatinine was 2.96 on presentation. Echocardiogram on March 18 showed ejection fraction  45-50, moderately dilated left atrium.  Hemoglobin was 8.9, platelet count was 57,000 on admission.  Troponin was 0.04.   On March 20, creatinine improved to 2.83, and platelet count dropped to 45,000.  On March 21, platelets dropped to 38,000 and hemoglobin 8.2, and creatinine was 2.75.   On March 22, the platelets improved to 46,000 and creatinine improved to 2.54.   TOTAL TIME SPENT ON THIS DISCHARGE: 40 minutes.     ____________________________ Ceasar Lund Anselm Jungling, MD vgv:tr D: 10/31/2014 17:00:22 ET T: 10/31/2014 17:52:43 ET JOB#: [id number]  cc:  Ceasar Lund Anselm Jungling, MD, <Dictator> Kathlene November. Grayland Ormond, MD Irven Easterly. Kary Kos, MD Murlean Iba, MD [name] MD ELECTRONICALLY SIGNED 11/12/2014 0:25

## 2014-12-06 NOTE — Consult Note (Signed)
Chief Complaint:  Subjective/Chief Complaint Patient states improve edema improve shortness of breath he still does make with exertion but denies any pain no fever.   VITAL SIGNS/ANCILLARY NOTES: **Vital Signs.:   19-Mar-16 04:09  Vital Signs Type Routine  Temperature Temperature (F) 97.9  Celsius 36.6  Temperature Source oral  Pulse Pulse 62  Respirations Respirations 18  Systolic BP Systolic BP 106  Diastolic BP (mmHg) Diastolic BP (mmHg) 74  Mean BP 93  Pulse Ox % Pulse Ox % 96  Pulse Ox Activity Level  At rest  Oxygen Delivery Room Air/ 21 %    12:29  Vital Signs Type Routine  Celsius 36.7  Temperature Source oral  Pulse Pulse 70  Respirations Respirations 18  Systolic BP Systolic BP 269  Diastolic BP (mmHg) Diastolic BP (mmHg) 62  Mean BP 78  Pulse Ox % Pulse Ox % 94  Pulse Ox Activity Level  At rest  Oxygen Delivery Room Air/ 21 %  *Intake and Output.:   Daily 19-Mar-16 07:00  Grand Totals Intake:  270 Output:  1150    Net:  -880 24 Hr.:  -880  Oral Intake      In:  240  IV (Primary)      In:  30  Urine ml     Out:  1150  Length of Stay Totals Intake:  270 Output:  1150    Net:  -880   Brief Assessment:  GEN well developed, well nourished, no acute distress   Cardiac Regular  murmur present  -- JVD   Respiratory normal resp effort   Gastrointestinal Normal   Gastrointestinal details normal Soft  Nontender  Nondistended  No masses palpable   EXTR positive edema   Lab Results: Thyroid:  19-Mar-16 04:12   Thyroid Stimulating Hormone 4.447 (0.350-4.500 NOTE: New Reference Range  09/29/14)  Routine Chem:  19-Mar-16 04:12   Glucose, Serum  126 (65-99 NOTE: New Reference Range  09/29/14)  BUN  76 (6-20 NOTE: New Reference Range  09/29/14)  Creatinine (comp)  2.73 (0.61-1.24 NOTE: New Reference Range  09/29/14)  Sodium, Serum 138 (135-145 NOTE: New Reference Range  09/29/14)  Potassium, Serum 4.1 (3.5-5.1 NOTE: New Reference Range   09/29/14)  Chloride, Serum 107 (101-111 NOTE: New Reference Range  09/29/14)  CO2, Serum 22 (22-32 NOTE: New Reference Range  09/29/14)  Calcium (Total), Serum 8.9 (8.9-10.3 NOTE: New Reference Range  09/29/14)  Anion Gap 9  eGFR (African American)  22  eGFR (Non-African American)  19 (eGFR values <69m/min/1.73 m2 may be an indication of chronic kidney disease (CKD). Calculated eGFR is useful in patients with stable renal function. The eGFR calculation will not be reliable in acutely ill patients when serum creatinine is changing rapidly. It is not useful in patients on dialysis. The eGFR calculation may not be applicable to patients at the low and high extremes of body sizes, pregnant women, and vegetarians.)  Magnesium, Serum  1.5 (1.7-2.4 THERAPEUTIC RANGE: 4-7 mg/dL TOXIC: > 10 mg/dL  ----------------------- NOTE: New Reference Range  09/29/14)  Routine Coag:  19-Mar-16 04:12   Prothrombin  29.7 (11.4-15.0 NOTE: New Reference Range  09/04/14)  INR 2.8 (INR reference interval applies to patients on anticoagulant therapy. A single INR therapeutic range for coumarins is not optimal for all indications; however, the suggested range for most indications is 2.0 - 3.0. Exceptions to the INR Reference Range may include: Prosthetic heart valves, acute myocardial infarction, prevention of myocardial infarction, and combinations of aspirin  and anticoagulant. The need for a higher or lower target INR must be assessed individually. Reference: The Pharmacology and Management of the Vitamin K  antagonists: the seventh ACCP Conference on Antithrombotic and Thrombolytic Therapy. PQZRA.0762 Sept:126 (3suppl): N9146842. A HCT value >55% may artifactually increase the PT.  In one study,  the increase was an average of 25%. Reference:  "Effect on Routine and Special Coagulation Testing Values of Citrate Anticoagulant Adjustment in Patients with High HCT Values." American Journal of  Clinical Pathology 2006;126:400-405.)  Routine Hem:  19-Mar-16 04:12   WBC (CBC)  3.7  RBC (CBC)  2.64  Hemoglobin (CBC)  8.9  Hematocrit (CBC)  26.9  Platelet Count (CBC)  53  MCV  102  MCH 33.8  MCHC 33.2  RDW  17.3  Neutrophil % 68.8  Lymphocyte % 15.3  Monocyte % 12.2  Eosinophil % 3.2  Basophil % 0.5  Neutrophil # 2.6  Lymphocyte #  0.6  Monocyte # 0.5  Eosinophil # 0.1  Basophil # 0.0 (Result(s) reported on 24 Oct 2014 at 04:49AM.)   Radiology Results: Cardiology:    18-Mar-16 12:54, Echo Doppler  Echo Doppler   REASON FOR EXAM:      COMMENTS:       PROCEDURE: [facility] - ECHO DOPPLER COMPLETE(TRANSTHOR)  - Oct 23 2014 12:54PM     RESULT: Echocardiogram Report    Patient Name:   Glenda Chroman Date of Exam: 10/23/2014  Medical Rec #:  [id number]       Custom1:  Date of Birth:  Feb 07, 1920    Height:       74.0 in  Patient Age:    81 years     Weight:       205.0 lb  Patient Gender: M            BSA:          2.20 m??    Indications: SOB  Sonographer:    Sherrie Sport RDCS  Referring Phys: Nicholes Mango    Summary:   1. Leftventricular ejection fraction, by visual estimation, is 45 to   50%.   2. Normal global left ventricular systolic function.   3. Right ventricular volume overload.   4. Moderately dilated left atrium.   5. Mildly dilated right atrium.   6. Mild mitral valve regurgitation.   7. Mild aortic valve stenosis.   8. Moderate to severe tricuspid regurgitation.   9. Moderately elevated pulmonary artery systolic pressure.  10. Mild apical septal hypo.  2D AND M-MODE MEASUREMENTS (normal ranges within parentheses):  Left Ventricle:          Normal  IVSd (2D):      1.30 cm (0.7-1.1)  LVPWd (2D):     1.00 cm (0.7-1.1) Aorta/LA:                  Normal  LVIDd (2D):     4.31 cm (3.4-5.7) Aortic Root (2D): 3.40 cm (2.4-3.7)  LVIDs (2D):     2.96 cmLeft Atrium (2D): 5.00 cm (1.9-4.0)  LV FS (2D):     31.3 %   (>25%)  LV EF (2D):     59.4 %   (>50%)                                     Right Ventricle:  RVd (2D):        4.38 cm  LV DIASTOLIC FUNCTION:  MV Peak E: 1.31 m/s E/e' Ratio: 20.40                      Decel Time: 225 msec  SPECTRAL DOPPLER ANALYSIS (where applicable):  Mitral Valve:  MV P1/2 Time: 65.25 msec  MV Area, PHT: 3.37 cm??  Aortic Valve: AoV Max Vel: 2.02 m/s AoV Peak PG: 16.3 mmHg AoV Mean PG:   9.7 mmHg  LVOT Vmax: 0.74 m/s LVOT VTI: 0.177 m LVOT Diameter: 2.00 cm  AoV Area, Vmax: 1.16 cm?? AoV Area, VTI: 1.20 cm?? AoV Area, Vmn: 1.30 cm??  Tricuspid Valve and PA/RV Systolic Pressure: TR Max Velocity: 3.64 m/s RA   Pressure: 5 mmHg RVSP/PASP: 58.0 mmHg  Pulmonic Valve:  PV Max Velocity: 0.91 m/s PV Max PG: 3.3 mmHg PV Mean PG:    PHYSICIAN INTERPRETATION:  Left Ventricle: The left ventricular internal cavity size was normal. LV   septal wall thickness was normal. LV posterior wall thickness was normal.   Global LV systolic function was normal. Left ventricular ejection   fraction, by visual estimation, is 45 to 50%. The interventricular septum     is flattened in diastole ('D' shaped left ventricle), consistent with   right ventricular volume overload.  Right Ventricle: The right ventricular size is mildly enlarged. Global RV   systolic function is low normal.  Left Atrium: The left atrium is moderately dilated.  Right Atrium: The right atrium is mildly dilated.  Pericardium: There is no evidence of pericardial effusion.  Mitral Valve: The mitral valve is normal in structure. Mild mitral valve   regurgitation is seen.  Tricuspid Valve: The tricuspid valve is normal. Moderate to severe   tricuspid regurgitation is visualized. The tricuspid regurgitant velocity   is 3.64 m/s, and with an assumed right atrial pressure of 5 mmHg, the   estimated right ventricular systolic pressure is moderately elevated at   58.0 mmHg.  Aortic Valve: The aortic valve is normal. Mild aortic stenosis is      present. No evidence of aortic valve regurgitation is seen.  Pulmonic Valve: The pulmonic valve is normal. Trace pulmonic valve   regurgitation.    [place] MD  Electronically signed by [id number] Lujean Amel MD  Signature Date/Time: 10/23/2014/4:20:27 PM    *** Final ***    IMPRESSION: .        Verified By: Yolonda Kida, M.D., MD   Assessment/Plan:  Assessment/Plan:  Assessment IMP  congestive heart failure  leg edema  cardiomyopathy  acute on chronic renal insufficiency  hyperlipidemia  atrial fibrillation  obstructive sleep apnea  diabetes  shortness of breath .   Plan continue telemetry  supplemental oxygen  inhalers as necessary  continue IV diuretic therapy  follow-up with Nephrology for renal insufficiency  DVT prophylaxis  continue therapy for atrial fibrillation  anticoagulation with Coumadin  CPAP as necessary for obstructive sleep apnea  continue chronic therapy for gout  agree with echocardiogram  medical therapy   Electronic Signatures: Yolonda Kida (MD)  (Signed 19-Mar-16 13:57)  Authored: Chief Complaint, VITAL SIGNS/ANCILLARY NOTES, Brief Assessment, Lab Results, Radiology Results, Assessment/Plan   Last Updated: 19-Mar-16 13:57 by Yolonda Kida (MD)

## 2014-12-06 NOTE — Consult Note (Signed)
Note Type Consult   Subjective: Chief Complaint/Diagnosis:   Anemia and thrombocytopenia. HPI:   Patient last evaluated in clinic in October 2015. He was recently admitted the hospital with increasing edema and shortness of breath secondary to his known CHF. He continues to be pancytopenic, slightly worse than his baseline. He otherwise has felt well. He denies anyrecent fevers or illnesses.  He denies any easy bleeding or bruising.  He has a good appetite and his weight is stable.  Patient denies any chest pain, cough, or hemoptysis.  He has no melena or hematochezia.  He denies any urinary complaints.  Patient offers no further specific complaints today.   Review of Systems:  Performance Status (ECOG): 1  Review of Systems:   As per HPI. Otherwise, a complete review of systems is negative.   Allergies:  PCN: Swelling  Xylocaine: Hypotension  PFSH: Additional Past Medical and Surgical History: Hypertension, diabetes, cataracts, you fibrillation, arthritis, MRSA, bladder cancer.  Right knee replacement, cataracts, pacemaker.    Family history: Sister with myelofibrosis.    Social history: Patient denies tobacco or alcohol.   Home Medications: Medication Instructions Last Modified Date/Time  acetaminophen-oxyCODONE 325 mg-5 mg tablet 1 tab(s) orally every 4 hours -for Pain 18-Mar-16 12:44  sertraline 50 mg tablet 1 tab(s) orally once a day (at bedtime)  18-Mar-16 12:44  Zocor 40 mg tablet 1 tab(s) orally once a day (at bedtime)  18-Mar-16 12:44  Actos 45 mg tablet 1 tab(s) orally once a day  18-Mar-16 12:44  allopurinol 100 mg tablet 1 tab(s) orally once a day  18-Mar-16 12:44  Januvia 50 mg tablet 1 tab(s) orally once a day (at bedtime)  18-Mar-16 12:44  Vitamin B-12 1000 mcg oral tablet 1 tab(s) orally once a day 18-Mar-16 12:44  Coreg 12.5 mg oral tablet 1 tab(s) orally 2 times a day 18-Mar-16 12:44  lisinopril 2.5 mg oral tablet 1 tab(s) orally once a day 18-Mar-16 12:44   warfarin 5 mg oral tablet 1 tab(s) orally once a day (in the morning) every Tuesday, Thursday, and Sunday  18-Mar-16 12:44  omeprazole 20 mg oral delayed release capsule 1 cap(s) orally once a day 18-Mar-16 12:44  Flomax 0.4 mg oral capsule 1 cap(s) orally once a day 18-Mar-16 12:44  warfarin 2.5 mg oral tablet  orally Monday, Wednesday, and Friday and Saturday  18-Mar-16 12:44  ondansetron 4 mg oral tablet 1 tab(s) orally every 8 hours, As Needed - for Nausea, Vomiting 18-Mar-16 12:44  furosemide 40 mg oral tablet 1 tab(s) orally 2 times a day 18-Mar-16 12:44  colchicine 0.6 mg oral tablet 1 tab(s) orally 2 times a day, As Needed - for Pain 18-Mar-16 12:44  Saline Nasal Mist  inhaled every 2 hours, As Needed 18-Mar-16 12:44   Vital Signs:  :: vital signs stable, patient afebrile.   Physical Exam:  General: well developed, well nourished, and in no acute distress  Mental Status: normal affect  Eyes: anicteric sclera  Respiratory: clear to auscultation bilaterally, anterior exam only  Cardiovascular: regular rate and rhythm, no murmur, rub, or gallop  Gastrointestinal: soft, normal active bowel sounds  Musculoskeletal: 1-2+ bilateral edema.  Skin: No rash or petechiae noted  Neurological: alert, answering all questions appropriately.  Cranial nerves grossly intact   Laboratory Results: Routine Chem:  21-Mar-16 04:13   Glucose, Serum  115 (65-99 NOTE: New Reference Range  09/29/14)  BUN  79 (6-20 NOTE: New Reference Range  09/29/14)  Creatinine (comp)  2.75 (0.61-1.24 NOTE:  New Reference Range  09/29/14)  Sodium, Serum 138 (135-145 NOTE: New Reference Range  09/29/14)  Potassium, Serum 3.7 (3.5-5.1 NOTE: New Reference Range  09/29/14)  Chloride, Serum 107 (101-111 NOTE: New Reference Range  09/29/14)  CO2, Serum  21 (22-32 NOTE: New Reference Range  09/29/14)  Calcium (Total), Serum  8.8 (8.9-10.3 NOTE: New Reference Range  09/29/14)  Anion Gap 10  eGFR (African  American)  22  eGFR (Non-African American)  19 (eGFR values <63m/min/1.73 m2 may be an indication of chronic kidney disease (CKD). Calculated eGFR is useful in patients with stable renal function. The eGFR calculation will not be reliable in acutely ill patients when serum creatinine is changing rapidly. It is not useful in patients on dialysis. The eGFR calculation may not be applicable to patients at the low and high extremes of body sizes, pregnant women, and vegetarians.)  Routine Coag:  21-Mar-16 04:13   Prothrombin  30.7 (11.4-15.0 NOTE: New Reference Range  09/04/14)  INR 2.9 (INR reference interval applies to patients on anticoagulant therapy. A single INR therapeutic range for coumarins is not optimal for all indications; however, the suggested range for most indications is 2.0 - 3.0. Exceptions to the INR Reference Range may include: Prosthetic heart valves, acute myocardial infarction, prevention of myocardial infarction, and combinations of aspirin and anticoagulant. The need for a higher or lower target INR must be assessed individually. Reference: The Pharmacology and Management of the Vitamin K  antagonists: the seventh ACCP Conference on Antithrombotic and Thrombolytic Therapy. CFYBOF.7510Sept:126 (3suppl): [id number] A HCT value >55% may artifactually increase the PT.  In one study,  the increase was an average of 25%. Reference:  "Effect on Routine and Special Coagulation Testing Values of Citrate Anticoagulant Adjustment in Patients with High HCT Values." American Journal of Clinical Pathology 2006;126:400-405.)  Routine Hem:  21-Mar-16 04:13   WBC (CBC) 4.4  RBC (CBC)  2.49  Hemoglobin (CBC)  8.2  Hematocrit (CBC)  25.3  Platelet Count (CBC)  38  MCV  101  MCH 32.8  MCHC 32.4  RDW  16.7  Neutrophil % 67.9  Lymphocyte % 14.5  Monocyte % 14.1  Eosinophil % 3.1  Basophil % 0.4  Neutrophil # 3.0  Lymphocyte #  0.6  Monocyte # 0.6  Eosinophil # 0.1   Basophil # 0.0 (Result(s) reported on 26 Oct 2014 at 05:50AM.)   Assessment and Plan: Impression:   Anemia and thrombocytopenia. Plan:   1.  Anemia:  Secondary to renal insufficiency. Patient reports he has not received Procrit since his last clinic visit in October 2015. Will proceed with 40,000 units subcutaneous Procrit today. Will also order iron stores for completeness. He may have underlying MDS, but this would taking a BMBx to diagnose which is not it necessary at this point.  Bone marrow from patient's hip fracture last year was unrevealing.Thrombocytopenia: Decreased from patient's baseline. Continue to monitor closely. Possible MDS as above. No intervention needed.  Patient does not require a transfusion.  follow.  Electronic Signatures: FDelight Hoh(MD)  (Signed 21-Mar-16 18:57)  Authored: Note Type, CC/HPI, Review of Systems, ALLERGIES, Patient Family Social History, HOME MEDICATIONS, Vital Signs, Physical Exam, Lab Results Review, Assessment and Plan   Last Updated: 21-Mar-16 18:57 by FDelight Hoh(MD)

## 2014-12-06 NOTE — H&P (Signed)
PATIENT NAME:  [name], [name] MR#:  [id number] DATE OF BIRTH:  [birth year]  DATE OF ADMISSION:  08/10/2014  PRIMARY CARE PHYSICIAN: Irven Easterly. Kary Kos, MD  CARDIOLOGIST: Isaias Cowman, MD  CHIEF COMPLAINT: Shortness of breath.   HISTORY OF PRESENT ILLNESS: The patient is a 81 year old gentleman with history of atrial fibrillation, sick sinus syndrome, status post pacemaker, hypertension, hyperlipidemia, and chronic diastolic congestive heart failure. The patient has a several-week history of persistent exertional dyspnea associated with peripheral edema and weight gain consistent with diastolic congestive heart failure that has been refractory to outpatient diuretic therapy. Despite aggressive outpatient diuresis, the patient has been evaluated in [facility] Emergency Room for shortness of breath and increased abdominal girth. Furosemide was increased to 40 mg b.i.d. without effect. The patient has also been treated with metolazone 5 mg daily for 5 days with modest diuresis.   PAST MEDICAL HISTORY:  1.  Atrial fibrillation.  2.  Sick sinus syndrome, status post pacemaker 12/03/2008.  3.  Hypertension.  4.  Hyperlipidemia.  5.  Chronic diastolic congestive heart failure.  6.  Type 2 diabetes.  7.  Sleep apnea.  8.  Hypertension.  9.  Chronic anemia.  10.  Gout. 11.  Chronic kidney disease.    MEDICATIONS ON ADMISSION: Carvedilol 12.5 mg b.i.d., furosemide 40 mg b.i.d., simvastatin 40 mg daily, warfarin 5 mg daily, metolazone 5 mg daily p.r.n., allopurinol 100 mg daily, cyanocobalamin 5000 mcg daily, iron 50 mg daily, Januvia 50 mg daily, omeprazole 20 mg daily, pioglitazone 45 mg daily, potassium chloride 20 mEq daily, Zoloft 50 mg at bedtime, Flomax 0.4 mg daily.   SOCIAL HISTORY: The patient currently is a resident at [facility]. He denies tobacco abuse.   FAMILY HISTORY: No immediate family history of coronary artery disease or myocardial infarction.   REVIEW OF SYSTEMS:  CONSTITUTIONAL:  No fever or chills.  EYES: No blurry vision.  EARS: No hearing loss.  RESPIRATORY: Shortness of breath as described above.  CARDIOVASCULAR: No chest pain.  GASTROINTESTINAL: No nausea, vomiting, or diarrhea.  GENITOURINARY: No dysuria or hematuria.  ENDOCRINE: No polyuria or polydipsia.  MUSCULOSKELETAL: No arthralgias or myalgias.  NEUROLOGICAL: No focal muscle weakness or numbness.  PSYCHOLOGICAL: No depression or anxiety.   PHYSICAL EXAMINATION:  VITAL SIGNS: Blood pressure 118/80, pulse 72.  HEENT: Pupils equal and reactive to light and accommodation.  NECK: Supple without thyromegaly.  LUNGS: Reveal decreased breath sounds in both bases.  HEART: Normal JVP. Normal PMI. Regular rate and rhythm. Normal S1, S2. No appreciable gallop, murmur, or rub.  ABDOMEN: Soft and nontender.  EXTREMITIES: There was 1 to 2+ bilateral pedal edema.  MUSCULOSKELETAL: Normal muscle tone.  NEUROLOGIC: The patient is alert and oriented x 3. Motor and sensory both grossly intact.   IMPRESSION: A 81 year old gentleman with persistent peripheral edema and progressive shortness of breath due to diastolic congestive heart failure, likely multifactorial with history of hypertension, obesity, diabetes, and sleep apnea.   RECOMMENDATIONS:  1.  Admit to telemetry 2A. 2.  Furosemide 40 mg IV t.i.d.  3.  Closely monitor renal status.  4.  Low sodium diet.  5.  Daily weights. 6.  Daily I's and O's.     ____________________________ Isaias Cowman, MD ap:ah D: 08/10/2014 13:39:18 ET T: 08/10/2014 13:59:06 ET JOB#: [id number]  cc: Isaias Cowman, MD, <Dictator> Isaias Cowman MD ELECTRONICALLY SIGNED 08/26/2014 18:05

## 2014-12-06 NOTE — Consult Note (Signed)
PATIENT NAME:  [name], [name] MR#:  [id number] DATE OF BIRTH:  03-27-1920  DATE OF CONSULTATION:  10/23/2014  CONSULTING PHYSICIAN:  Dwayne D. Clayborn Bigness, MD  PRIMARY PHYSICIAN: [name], MD.  NEPHROLOGY PHYSICIAN: Murlean Iba, MD.  CARDIOLOGIST: Isaias Cowman, MD.   INDICATION FOR ADMISSION: Shortness of breath and leg edema.   HISTORY OF PRESENT ILLNESS: The patient is a 81 year old, white male with atrial fibrillation, sick sinus syndrome, pacemaker placement, hypertension, hyperlipidemia, chronic diastolic heart failure with leg edema and renal insufficiency. The patient states he had seen Dr. Candiss Norse who evaluated him because of persistent leg edema, shortness of breath and renal insufficiency and advised him to be admitted for further evaluation and care for his heart failure since he failed outpatient management. He denied any significant chest pain or palpitations. He does have significant leg swelling.   PAST MEDICAL HISTORY: Atrial fibrillation, sick sinus syndrome, hypertension, hyperlipidemia, diastolic heart failure, diabetes, sleep apnea, hypertension, chronic anemia, gout, chronic renal insufficiency.   SOCIAL HISTORY: Nonsmoker. Drinker of beer occasionally. He is retired.    FAMILY HISTORY: Coronary artery disease.   REVIEW OF SYSTEMS: Denies blackout spells as well as syncope. No significant nausea or vomiting. No fever. No chills. No sweats. No weight loss or weight gain. No hemoptysis, hematemesis. No bright red blood per rectum. He has had significant leg swelling, shortness of breath, worsening renal insufficiency.   PHYSICAL EXAMINATION: VITAL SIGNS: Blood pressure was 100/50, pulse 60, respiratory rate 18, afebrile.  HEENT: Normocephalic, atraumatic. Pupils equal and reactive to light.  NECK: Supple with mild JVD.  LUNGS: Clear with mild crackles in the bases. Occasional rhonchi.  HEART: Bradycardic. Systolic ejection murmur in the left sternal border.  Positive S4.  ABDOMEN: Benign.  EXTREMITIES: Have 3 to 4+ edema.  NEUROLOGIC: Intact.  SKIN: Normal.   MEDICATIONS: Vitamin B12 every day 1000 mg, Actos 45 mg daily, Lasix 40 mg daily, allopurinol 100 mg daily, lisinopril 2.5 daily, Flomax 0.4 daily, Prilosec 20 mg a day, Coreg 12.5 twice a day, Coumadin 2.5 Monday, Wednesday, Friday and 5 mg the other days of the week, Zocor 40 a day, Zoloft 50 once a day, Januvia 50 mg at bedtime, Zofran  mg a q8hrs prn, Percocet 5/325 every 4 p.r.n., colchicine 0.6 as needed for gout.   LABORATORY DATA: Again initially were pending.   ASSESSMENT: Diastolic congestive heart failure, leg edema, diabetes, acute on chronic renal insufficiency, hyperlipidemia, chronic atrial fibrillation, obstructive sleep apnea.   PLAN:  1. Agree with admit. Treat with Lasix for heart failure. Recommend echocardiogram for evaluation. Continue Coreg. May hold lisinopril because of renal insufficiency.  2. For his significant renal insufficiency, involve nephrology for management and care. May place on a heparin drip to see if that helps with significant heart failure and improve his renal function. Avoid angiotensin converting enzyme inhibitors for now.  3. For diabetes, continue Januvia. Hold his Actos initially because of significant leg swelling and fluid retention. Use sliding scale.  4. Gout, continue allopurinol.  5. For hyperlipidemia, continue simvastatin therapy.  6. For chronic atrial fibrillation, continue Coumadin. We will follow PT and INR. Coreg for rate control. 7. For obstructive sleep apnea. Continue CPAP therapy.  8. Would discontinue heart failure therapy for now and hopefully have the patient's renal function not deteriorate and see how the patient does. It is expected the heparin drip should help in the interim.  9. We will continue to follow.    ____________________________ Loran Senters Callwood,  MD ddc:TT D: 10/25/2014 12:15:00 ET T: 10/25/2014 13:49:35  ET JOB#: [id number]  cc: Dwayne D. Clayborn Bigness, MD, <Dictator> Yolonda Kida MD ELECTRONICALLY SIGNED 11/03/2014 9:07

## 2014-12-06 NOTE — H&P (Signed)
PATIENT NAME:  [name], [name] MR#:  [id number] DATE OF BIRTH:  January 29, 1920  DATE OF ADMISSION:  10/23/2014  PRIMARY CARE PROVIDER:  Dr. Irven Easterly. Hedrick  CARDIOLOGIST:   Dr. Saralyn Pilar  NEPHROLOGIST:  Dr. Candiss Norse  CHIEF COMPLAINT:   Shortness of breath, significant lower extremity swelling.   HISTORY OF PRESENT ILLNESS:   The patient is a 81 year old white male with history of atrial fibrillation, sick sinus syndrome, status post pacemaker placement, hypertension, hyperlipidemia, chronic diastolic systolic congestive heart failure who is presenting with progressive lower extremity swelling and shortness of breath.   He was seen by his nephrologist, Dr. Candiss Norse, yesterday who evaluated him and sent him today for direct admission.   The patient otherwise denies any chest pain; complains of nocturnal dyspnea and orthopnea.   He also complains of dyspnea on exertion.   Denies any fevers, chills.   No chest pain.   PAST MEDICAL HISTORY 1. History of atrial fibrillation.  2. History of sick sinus syndrome status post pacemaker placement 12/03/2008.   3. Hypertension.   4. Hyperlipidemia. 5. Chronic mixed diastolic systolic CHF.    6. Type 2 diabetes.  7. Sleep apnea; uses CPAP at bedtime.  8. Hypertension.  9. Chronic anemia.   10. Gout.  11. Chronic kidney disease.   MEDICATIONS AT HOME:  B12 at 1,000 mcg every day; Actos 45 p.o. every day;  Lasix 40, one tab p.o. every day; allopurinol 100, one tab p.o. every day; lisinopril 2.5, one tab p.o. every day; Flomax 0.42 daily; Prilosec 20 every day; Coreg 12.5, one tab p.o. every day; Coumadin 2.5 mg every Monday, Wednesday, Friday and Saturday, the rest of the week is 5 mg; Zocor 40 every day; Zoloft 50, one tab p.o. at bedtime; Januvia 50 q.h.s.; Zofran 4 mg b.i.d. p.r.n.; Percocet 5/325 q. 4 p.r.n.; colchicine 0.6 every day as needed for gout attack.  SOCIAL HISTORY:   Nonsmoker.   Drinks social beer.    FAMILY HISTORY:   Coronary artery disease in  father.   REVIEW OF SYSTEMS:  CONSTITUTIONAL:   Denies any significant pain, no weight loss, no weight gain.  HEENT:   Denies any blurred vision or cataracts or glaucoma.   EARS, NOSE AND THROAT:  Denies any epistaxis.   No seasonal allergies.   No difficulty swallowing.   Denies any ringing in the ears.   No difficulty with hearing.   CARDIOVASCULAR:   Complains of shortness of breath.   Has a history of atrial fibrillation, history of dyspnea on exertion or nocturnal complaints of orthopnea, significant lower extremity swelling.  PULMONARY:   Denies any cough, wheezing, hemoptysis.   No COPD.  Complains of dyspnea.  GASTROINTESTINAL:   No nausea, vomiting or diarrhea.   GENITOURINARY:    Denies any frequency, urgency or hesitancy.  ENDOCRINE:   Denies any polyuria or nocturia.   No thyroid.  No hypothyroidism.  SKIN:   Has chronic discoloration of the lower extremities.  NEUROLOGICAL:  No CVA, TIA, or seizures.  PSYCHIATRIC:   Denies any depression or anxiety currently.  LYMPHATICS:   Denies any lymph node enlargement.  VASCULAR:   Denies any claudication symptoms.    PHYSICAL EXAMINATION:    VITAL SIGNS:   Temperature 97.5, pulse 60, respirations 18, blood pressure 99/56, O2 of 96%.  GENERAL:   The patient is an obese male currently not in any acute distress.   HEENT:   Head atraumatic, normocephalic.   Pupils are equal, round,  and reactive to light and accommodation.   There is no conjunctival pallor; no scleral icterus.   Nasal exam shows no drainage or ulceration.  Oropharynx is clear without any exudate.  NECK:   Supple without any thyromegaly.  HEART:   Regular rate and rhythm.   He has a systolic murmur at the left sternal border.   No gallops.   LUNGS:   He has got some crackles at the bases.   No accessory muscle usage.   No wheezing.   No rhonchi.  ABDOMEN:   Soft, nontender, nondistended.   Positive bowel sounds x 4.  EXT:   He has got 3+ edema both lower extremities and some  lower extremity discoloration.  NEUROLOGICAL:   The patient awake, alert, oriented x 3, and no focal deficits.   LYMPH NODES:   Nonpalpable.    VASCULAR:   Good DP pulses.  PSYCHIATRIC:  Not anxious or depressed.   LABORATORY:   Currently pending.   ASSESSMENT AND PLAN:  The patient is a 81 year old with history of mixed systolic/diastolic congestive heart failure, sent from his nephrologist for direct admission: 1. Acute diastolic systolic congestive heart failure.  At this time, he will be started on a Lasix drip since he has not had an echocardiogram in more than a year.   We will go ahead and get echocardiogram of the heart.   I will ask his cardiologist to evaluate the patient.  In light of his blood pressure being low here, I will hold his lisinopril.   Try to continue the Coreg if possible.   2. Diabetes.   We will place the patient on a carbohydrate controlled diet.  He will be on a sliding scale.   I will continue his Januvia and hold his Actos which can cause swelling and fluid retention.  3. History of gout.  We will continue allopurinol.    4. Chronic renal failure.   We will monitor his renal function while on Lasix.   Nephrology consult will be obtained.  5. Hyperlipidemia.   We will continue simvastatin as taking at home.   6. Chronic atrial fibrillation.   We will continue Coumadin.   Check a PT today and daily.  7. Sleep apnea.  He will be on a CPAP machine at bedtime.  8. Miscellaneous:  Code status:   The patient is a dnr which is confirmed with the patient and his previous records.     ____________________________ Lafonda Mosses Posey Pronto, MD shp:tr D: 10/23/2014 12:17:00 ET T: 10/23/2014 12:22:30 ET JOB#: [id number]  cc: Marcha Licklider H. Posey Pronto, MD, <Dictator> Alric Seton MD ELECTRONICALLY SIGNED 11/06/2014 14:14

## 2014-12-06 NOTE — Consult Note (Signed)
Chief Complaint:  Subjective/Chief Complaint Patient states improve symptoms no leg swelling he is having regular the bathroom fairly often no chest pain no significant shortness of breath   VITAL SIGNS/ANCILLARY NOTES: **Vital Signs.:   22-Mar-16 12:20  Vital Signs Type Routine  Temperature Temperature (F) 97.4  Celsius 36.3  Temperature Source oral  Pulse Pulse 59  Respirations Respirations 18  Systolic BP Systolic BP 341  Diastolic BP (mmHg) Diastolic BP (mmHg) 65  Mean BP 81  Pulse Ox % Pulse Ox % 92  Pulse Ox Activity Level  At rest  Oxygen Delivery Room Air/ 21 %  *Intake and Output.:   Daily 22-Mar-16 07:00  Grand Totals Intake:  500 Output:  1375    Net:  -875 24 Hr.:  -875  Oral Intake      In:  240  Blood Product      In:  50  IV (Primary)      In:  60  IV (Secondary)      In:  150  Urine ml     Out:  1375  Length of Stay Totals Intake:  1010 Output:  5895    Net:  -4885   Brief Assessment:  GEN well developed, well nourished, no acute distress   Cardiac Regular  murmur present  -- JVD   Respiratory normal resp effort   Gastrointestinal Normal   Gastrointestinal details normal Soft  Nontender  Nondistended  No masses palpable   EXTR positive edema   Lab Results: Routine Chem:  22-Mar-16 04:00   Ferritin ([facility]) 294 (24-336 NOTE: New Reference Range  09/29/14)  Iron Binding Capacity (TIBC) 257  Unbound Iron Binding Capacity 222.9  Iron, Serum  34 (45-182 NOTE: New Reference Range  09/29/14)  Iron Saturation 13 (Result(s) reported on 27 Oct 2014 at 05:27AM.)  Glucose, Serum  141 (65-99 NOTE: New Reference Range  09/29/14)  BUN  72 (6-20 NOTE: New Reference Range  09/29/14)  Creatinine (comp)  2.54 (0.61-1.24 NOTE: New Reference Range  09/29/14)  Sodium, Serum 139 (135-145 NOTE: New Reference Range  09/29/14)  Potassium, Serum 4.0 (3.5-5.1 NOTE: New Reference Range  09/29/14)  Chloride, Serum 107 (101-111 NOTE: New Reference Range   09/29/14)  CO2, Serum 24 (22-32 NOTE: New Reference Range  09/29/14)  Calcium (Total), Serum 9.1 (8.9-10.3 NOTE: New Reference Range  09/29/14)  Anion Gap 8  eGFR (African American)  24  eGFR (Non-African American)  21 (eGFR values <26m/min/1.73 m2 may be an indication of chronic kidney disease (CKD). Calculated eGFR is useful in patients with stable renal function. The eGFR calculation will not be reliable in acutely ill patients when serum creatinine is changing rapidly. It is not useful in patients on dialysis. The eGFR calculation may not be applicable to patients at the low and high extremes of body sizes, pregnant women, and vegetarians.)  Magnesium, Serum  1.6 (1.7-2.4 THERAPEUTIC RANGE: 4-7 mg/dL TOXIC: > 10 mg/dL  ----------------------- NOTE: New Reference Range  09/29/14)  Routine Coag:  22-Mar-16 04:00   Prothrombin  31.0 (11.4-15.0 NOTE: New Reference Range  09/04/14)  INR 3.0 (INR reference interval applies to patients on anticoagulant therapy. A single INR therapeutic range for coumarins is not optimal for all indications; however, the suggested range for most indications is 2.0 - 3.0. Exceptions to the INR Reference Range may include: Prosthetic heart valves, acute myocardial infarction, prevention of myocardial infarction, and combinations of aspirin and anticoagulant. The need for a higher or lower target INR  must be assessed individually. Reference: The Pharmacology and Management of the Vitamin K  antagonists: the seventh ACCP Conference on Antithrombotic and Thrombolytic Therapy. VHSJW.9090 Sept:126 (3suppl): N9146842. A HCT value >55% may artifactually increase the PT.  In one study,  the increase was an average of 25%. Reference:  "Effect on Routine and Special Coagulation Testing Values of Citrate Anticoagulant Adjustment in Patients with High HCT Values." American Journal of Clinical Pathology 2006;126:400-405.)  Routine Hem:  22-Mar-16 04:00    WBC (CBC) 4.5  RBC (CBC)  2.65  Hemoglobin (CBC)  8.7  Hematocrit (CBC)  27.0  Platelet Count (CBC)  46  MCV  102  MCH 32.8  MCHC 32.1  RDW  17.5  Neutrophil % 64.3  Lymphocyte % 15.7  Monocyte % 15.6  Eosinophil % 3.9  Basophil % 0.5  Neutrophil # 2.9  Lymphocyte #  0.7  Monocyte # 0.7  Eosinophil # 0.2  Basophil # 0.0 (Result(s) reported on 27 Oct 2014 at 04:59AM.)   Assessment/Plan:  Assessment/Plan:  Assessment IMP  leg edema  atrial fibrillation chronic congestive heart failure  cardiomyopathy  acute on chronic renal insufficiency  hyperlipidemia  atrial fibrillation  obstructive sleep apnea  diabetes  shortness of breath .   Plan agree with continued Lasix drip continue telemetry Physical therapy  supplemental oxygen  inhalers as necessary  follow-up with Nephrology for renal insufficiency  DVT prophylaxis  continue therapy for atrial fibrillation  anticoagulation with Coumadin  CPAP as necessary for obstructive sleep apnea  continue chronic therapy for gout  agree with echocardiogram  medical therapy   Electronic Signatures: Lujean Amel D (MD)  (Signed 22-Mar-16 12:54)  Authored: Chief Complaint, VITAL SIGNS/ANCILLARY NOTES, Brief Assessment, Lab Results, Assessment/Plan   Last Updated: 22-Mar-16 12:54 by Yolonda Kida (MD)

## 2014-12-06 NOTE — Consult Note (Signed)
Chief Complaint:  Subjective/Chief Complaint Patient states improve edema improve shortness of breath he still does make with exertion but denies any pain no fever.   VITAL SIGNS/ANCILLARY NOTES: **Vital Signs.:   20-Mar-16 12:00  Vital Signs Type Routine  Temperature Temperature (F) 97.5  Celsius 36.3  Temperature Source oral  Pulse Pulse 60  Respirations Respirations 20  Systolic BP Systolic BP 962  Diastolic BP (mmHg) Diastolic BP (mmHg) 59  Mean BP 74  Pulse Ox % Pulse Ox % 94  Pulse Ox Activity Level  At rest  Oxygen Delivery Room Air/ 21 %  *Intake and Output.:   Shift 20-Mar-16 07:00  Grand Totals Intake:  60 Output:  650    Net:  -590 24 Hr.:  -1490  IV (Primary)      In:  60  Urine ml     Out:  650  Length of Stay Totals Intake:  460 Output:  2770    Net:  -2310   Brief Assessment:  GEN well developed, well nourished, no acute distress   Cardiac Regular  murmur present  -- JVD   Respiratory normal resp effort   Gastrointestinal Normal   Gastrointestinal details normal Soft  Nontender  Nondistended  No masses palpable   EXTR positive edema   Lab Results: Hepatic:  20-Mar-16 04:01   Albumin, Serum  3.1 (3.5-5.0 NOTE: New reference range  09/29/14)  Routine Chem:  20-Mar-16 04:01   BUN  76 (6-20 NOTE: New Reference Range  09/29/14)  Creatinine (comp)  2.83 (0.61-1.24 NOTE: New Reference Range  09/29/14)  Sodium, Serum 137 (135-145 NOTE: New Reference Range  09/29/14)  Potassium, Serum 3.8 (3.5-5.1 NOTE: New Reference Range  09/29/14)  Chloride, Serum 107 (101-111 NOTE: New Reference Range  09/29/14)  CO2, Serum 22 (22-32 NOTE: New Reference Range  09/29/14)  Calcium (Total), Serum  8.8 (8.9-10.3 NOTE: New Reference Range  09/29/14)  Phosphorus, Serum 3.4 (2.5-4.6 NOTE: New Reference Range  09/29/14)  Anion Gap 8  eGFR (African American)  21  eGFR (Non-African American)  18 (eGFR values <45m/min/1.73 m2 may be an indication of  chronic kidney disease (CKD). Calculated eGFR is useful in patients with stable renal function. The eGFR calculation will not be reliable in acutely ill patients when serum creatinine is changing rapidly. It is not useful in patients on dialysis. The eGFR calculation may not be applicable to patients at the low and high extremes of body sizes, pregnant women, and vegetarians.)  Magnesium, Serum  1.5 (1.7-2.4 THERAPEUTIC RANGE: 4-7 mg/dL TOXIC: > 10 mg/dL  ----------------------- NOTE: New Reference Range  09/29/14)  Routine Hem:  20-Mar-16 04:01   WBC (CBC) 4.1  RBC (CBC)  2.51  Hemoglobin (CBC)  8.5  Hematocrit (CBC)  25.4  Platelet Count (CBC)  45  MCV  101  MCH 33.8  MCHC 33.4  RDW  16.7  Neutrophil % 64.4  Lymphocyte % 17.9  Monocyte % 14.2  Eosinophil % 3.1  Basophil % 0.4  Neutrophil # 2.6  Lymphocyte #  0.7  Monocyte # 0.6  Eosinophil # 0.1  Basophil # 0.0 (Result(s) reported on 25 Oct 2014 at 04:57AM.)   Assessment/Plan:  Assessment/Plan:  Assessment IMP  weakness  congestive heart failure  leg edema  cardiomyopathy  acute on chronic renal insufficiency  hyperlipidemia  atrial fibrillation  obstructive sleep apnea  diabetes  shortness of breath .   Plan continue telemetry Physical therapy  supplemental oxygen  inhalers as necessary  continue  IV diuretic therapy  follow-up with Nephrology for renal insufficiency  DVT prophylaxis  continue therapy for atrial fibrillation  anticoagulation with Coumadin  CPAP as necessary for obstructive sleep apnea  continue chronic therapy for gout  agree with echocardiogram  medical therapy   Electronic Signatures: Yolonda Kida (MD)  (Signed 20-Mar-16 16:37)  Authored: Chief Complaint, VITAL SIGNS/ANCILLARY NOTES, Brief Assessment, Lab Results, Assessment/Plan   Last Updated: 20-Mar-16 16:37 by Yolonda Kida (MD)

## 2015-01-07 ENCOUNTER — Inpatient Hospital Stay: Payer: Medicare PPO

## 2015-01-07 ENCOUNTER — Inpatient Hospital Stay: Payer: Medicare PPO | Attending: Oncology

## 2015-01-07 ENCOUNTER — Other Ambulatory Visit: Payer: Self-pay | Admitting: *Deleted

## 2015-01-07 DIAGNOSIS — Z79899 Other long term (current) drug therapy: Secondary | ICD-10-CM | POA: Insufficient documentation

## 2015-01-07 DIAGNOSIS — D649 Anemia, unspecified: Secondary | ICD-10-CM

## 2015-01-07 DIAGNOSIS — D696 Thrombocytopenia, unspecified: Secondary | ICD-10-CM | POA: Insufficient documentation

## 2015-01-07 DIAGNOSIS — N2889 Other specified disorders of kidney and ureter: Secondary | ICD-10-CM | POA: Insufficient documentation

## 2015-01-07 LAB — HEMOGLOBIN: HEMOGLOBIN: 11 g/dL — AB (ref 13.0–18.0)

## 2015-02-18 ENCOUNTER — Encounter: Payer: Self-pay | Admitting: Oncology

## 2015-02-18 ENCOUNTER — Other Ambulatory Visit: Payer: Self-pay

## 2015-02-18 ENCOUNTER — Inpatient Hospital Stay: Payer: Medicare PPO

## 2015-02-18 ENCOUNTER — Inpatient Hospital Stay (HOSPITAL_BASED_OUTPATIENT_CLINIC_OR_DEPARTMENT_OTHER): Payer: Medicare PPO | Admitting: Oncology

## 2015-02-18 ENCOUNTER — Inpatient Hospital Stay: Payer: Medicare PPO | Attending: Oncology

## 2015-02-18 VITALS — BP 125/78 | HR 71 | Temp 97.8°F | Resp 18 | Wt 189.8 lb

## 2015-02-18 DIAGNOSIS — N189 Chronic kidney disease, unspecified: Secondary | ICD-10-CM | POA: Diagnosis not present

## 2015-02-18 DIAGNOSIS — D649 Anemia, unspecified: Secondary | ICD-10-CM

## 2015-02-18 DIAGNOSIS — E119 Type 2 diabetes mellitus without complications: Secondary | ICD-10-CM

## 2015-02-18 DIAGNOSIS — Z87891 Personal history of nicotine dependence: Secondary | ICD-10-CM | POA: Diagnosis not present

## 2015-02-18 DIAGNOSIS — D696 Thrombocytopenia, unspecified: Secondary | ICD-10-CM | POA: Insufficient documentation

## 2015-02-18 DIAGNOSIS — I129 Hypertensive chronic kidney disease with stage 1 through stage 4 chronic kidney disease, or unspecified chronic kidney disease: Secondary | ICD-10-CM | POA: Insufficient documentation

## 2015-02-18 DIAGNOSIS — I2510 Atherosclerotic heart disease of native coronary artery without angina pectoris: Secondary | ICD-10-CM

## 2015-02-18 DIAGNOSIS — I4891 Unspecified atrial fibrillation: Secondary | ICD-10-CM | POA: Diagnosis not present

## 2015-02-18 DIAGNOSIS — D509 Iron deficiency anemia, unspecified: Secondary | ICD-10-CM

## 2015-02-18 DIAGNOSIS — Z79899 Other long term (current) drug therapy: Secondary | ICD-10-CM | POA: Diagnosis not present

## 2015-02-18 LAB — CBC
HCT: 33.9 % — ABNORMAL LOW (ref 40.0–52.0)
Hemoglobin: 11 g/dL — ABNORMAL LOW (ref 13.0–18.0)
MCH: 32.6 pg (ref 26.0–34.0)
MCHC: 32.3 g/dL (ref 32.0–36.0)
MCV: 100.9 fL — AB (ref 80.0–100.0)
PLATELETS: 82 10*3/uL — AB (ref 150–440)
RBC: 3.36 MIL/uL — AB (ref 4.40–5.90)
RDW: 14.6 % — ABNORMAL HIGH (ref 11.5–14.5)
WBC: 4.2 10*3/uL (ref 3.8–10.6)

## 2015-02-18 LAB — IRON AND TIBC
Iron: 62 ug/dL (ref 45–182)
SATURATION RATIOS: 25 % (ref 17.9–39.5)
TIBC: 252 ug/dL (ref 250–450)
UIBC: 190 ug/dL

## 2015-02-18 LAB — FERRITIN: Ferritin: 317 ng/mL (ref 24–336)

## 2015-03-06 NOTE — Progress Notes (Signed)
[facility]  [phone] [phone]) [phone]  ID: [name] OB: [birth year]  MR#: [id number]  QIH#:[id number]  Patient Care Team: Venia Carbon, MD as PCP - General (Internal Medicine)  CHIEF COMPLAINT:  Chief Complaint  Patient presents with  . Follow-up    anemia and thrombocytopenia    INTERVAL HISTORY: Patient returns to clinic today for repeat laboratory work and routine evaluation. He does not complain of weakness or fatigue today. He continues to have mild shortness of breath. He denies any recent fevers or illnesses.  He denies any easy bleeding or bruising.  He has a good appetite and his weight is stable.  Patient denies any chest pain, cough, or hemoptysis.  He has no melena or hematochezia.  He denies any urinary complaints.  Patient offers no specific complaints today.   REVIEW OF SYSTEMS:   Review of Systems  Constitutional: Positive for malaise/fatigue.  Respiratory: Positive for shortness of breath.   Cardiovascular: Negative.   Gastrointestinal: Negative.   Neurological: Positive for weakness.    As per HPI. Otherwise, a complete review of systems is negatve.  PAST MEDICAL HISTORY: Past Medical History  Diagnosis Date  . Arthritis   . Bladder cancer   . Diabetes   . Glaucoma   . Hypertension   . Sleep apnea   . Atrial fibrillation   . CHF (congestive heart failure)   . Atrial fibrillation   . Anemia   . Esophageal reflux   . Dyslipidemia   . LVF (left ventricular failure)   . CAD (coronary artery disease)   . Chronic kidney disease     PAST SURGICAL HISTORY: Past Surgical History  Procedure Laterality Date  . Knee arthroscopy    . Aortic valve replacement    . Left hip hemiarthroplasty      FAMILY HISTORY No family history on file.     ADVANCED DIRECTIVES:    HEALTH MAINTENANCE: History  Substance Use Topics  . Smoking status: Former Smoker    Types: Cigarettes  . Smokeless tobacco: Not on file    . Alcohol Use: Not on file     Colonoscopy:  PAP:  Bone density:  Lipid panel:  Allergies  Allergen Reactions  . Other Other (See Comments)    Hypotension  . Penicillins Other (See Comments)    Swollen feet    Current Outpatient Prescriptions  Medication Sig Dispense Refill  . carvedilol (COREG) 12.5 MG tablet TAKE 1 TABLET TWICE A DAY    . Cholecalciferol (VITAMIN D-1000 MAX ST) 1000 UNITS tablet Take by mouth.    . colchicine 0.6 MG tablet Take by mouth.    . furosemide (LASIX) 40 MG tablet Take 40 mg by mouth. Alternating 40 mg 1 BID with 40 mg 1 QAM.    . omeprazole (PRILOSEC) 20 MG capsule TAKE 1 CAPSULE DAILY    . potassium chloride SA (KLOR-CON M20) 20 MEQ tablet TAKE 1 TABLET DAILY    . sertraline (ZOLOFT) 50 MG tablet TAKE 1 TABLET AT BEDTIME    . simvastatin (ZOCOR) 40 MG tablet TAKE 1 TABLET DAILY    . sitaGLIPtin (JANUVIA) 50 MG tablet TAKE 1 TABLET DAILY    . tamsulosin (FLOMAX) 0.4 MG CAPS capsule Take by mouth.    . warfarin (COUMADIN) 2.5 MG tablet Take by mouth.    . warfarin (COUMADIN) 5 MG tablet 5 mg     No current facility-administered medications for this visit.    OBJECTIVE: Filed Vitals:  02/18/15 1445  BP: 125/78  Pulse: 71  Temp: 97.8 F (36.6 C)  Resp: 18     There is no height on file to calculate BMI.    ECOG FS:1 - Symptomatic but completely ambulatory  General: Well-developed, well-nourished, no acute distress. Eyes: Pink conjunctiva, anicteric sclera. Lungs: Clear to auscultation bilaterally. Heart: Regular rate and rhythm. No rubs, murmurs, or gallops. Abdomen: Soft, nontender, nondistended. No organomegaly noted, normoactive bowel sounds. Musculoskeletal: No edema, cyanosis, or clubbing. Neuro: Alert, answering all questions appropriately. Cranial nerves grossly intact. Skin: No rashes or petechiae noted. Psych: Normal affect.   LAB RESULTS:  Lab Results  Component Value Date   NA 137 10/28/2014   K 3.8 10/28/2014   CL  104 10/28/2014   CO2 23 10/28/2014   GLUCOSE 181* 10/28/2014   BUN 70* 10/28/2014   CREATININE 2.49* 10/28/2014   CALCIUM 9.5 10/28/2014   PROT 7.7 05/07/2014   ALBUMIN 3.4 05/07/2014   AST 29 05/07/2014   ALT 29 05/07/2014   ALKPHOS 93 05/07/2014   BILITOT 0.5 05/07/2014   GFRNONAA 21* 10/28/2014   GFRAA 25* 10/28/2014    Lab Results  Component Value Date   WBC 4.2 02/18/2015   NEUTROABS 3.0 11/09/2014   HGB 11.0* 02/18/2015   HCT 33.9* 02/18/2015   MCV 100.9* 02/18/2015   PLT 82* 02/18/2015     STUDIES: No results found.  ASSESSMENT: Anemia and thrombocytopenia.  PLAN:    1.  Anemia:  Secondary to renal insufficiency. Patient's hemoglobin is 11.0, therefore he does not require additional Procrit today. Iron stores continue to be within normal limits. No intervention is needed at this time. Return to clinic in 4 months with repeat laboratory work and further evaluation. He may have underlying MDS, but this would taking a BMBx to diagnose which is not it necessary at this point.  Bone marrow from patient's hip fracture last year was unrevealing. 2. Thrombocytopenia: Decreased, but approximately patient's baseline. Continue to monitor closely. Possible MDS as above. No intervention needed.   3. Chronic renal insufficiency: Patient's creatinine appears to be approximately his baseline. Treatment per nephrology.   Patient expressed understanding and was in agreement with this plan. He also understands that He can call clinic at any time with any questions, concerns, or complaints.     Lloyd Huger, MD   03/06/2015 2:58 PM

## 2015-04-16 ENCOUNTER — Ambulatory Visit
Admission: RE | Admit: 2015-04-16 | Discharge: 2015-04-16 | Disposition: A | Discharge status: Home / Self Care | Payer: Medicare PPO | Source: Ambulatory Visit | Attending: Nephrology | Admitting: Nephrology

## 2015-04-16 ENCOUNTER — Other Ambulatory Visit: Payer: Self-pay | Admitting: Nephrology

## 2015-04-16 DIAGNOSIS — I517 Cardiomegaly: Secondary | ICD-10-CM

## 2015-04-16 DIAGNOSIS — R0602 Shortness of breath: Secondary | ICD-10-CM

## 2015-04-16 DIAGNOSIS — J90 Pleural effusion, not elsewhere classified: Secondary | ICD-10-CM

## 2015-04-16 DIAGNOSIS — R918 Other nonspecific abnormal finding of lung field: Secondary | ICD-10-CM

## 2015-04-19 ENCOUNTER — Encounter: Payer: Self-pay | Admitting: Internal Medicine

## 2015-04-19 ENCOUNTER — Inpatient Hospital Stay
Admission: AD | Admit: 2015-04-19 | Discharge: 2015-04-22 | DRG: 292 | Disposition: A | Discharge status: Home / Self Care | Payer: Medicare PPO | Source: Ambulatory Visit | Attending: Internal Medicine | Admitting: Internal Medicine

## 2015-04-19 DIAGNOSIS — Z79899 Other long term (current) drug therapy: Secondary | ICD-10-CM | POA: Diagnosis not present

## 2015-04-19 DIAGNOSIS — H409 Unspecified glaucoma: Secondary | ICD-10-CM | POA: Diagnosis present

## 2015-04-19 DIAGNOSIS — Z950 Presence of cardiac pacemaker: Secondary | ICD-10-CM | POA: Diagnosis not present

## 2015-04-19 DIAGNOSIS — I10 Essential (primary) hypertension: Secondary | ICD-10-CM | POA: Diagnosis not present

## 2015-04-19 DIAGNOSIS — N400 Enlarged prostate without lower urinary tract symptoms: Secondary | ICD-10-CM | POA: Diagnosis present

## 2015-04-19 DIAGNOSIS — I255 Ischemic cardiomyopathy: Secondary | ICD-10-CM | POA: Diagnosis present

## 2015-04-19 DIAGNOSIS — Z8551 Personal history of malignant neoplasm of bladder: Secondary | ICD-10-CM | POA: Diagnosis not present

## 2015-04-19 DIAGNOSIS — I495 Sick sinus syndrome: Secondary | ICD-10-CM | POA: Diagnosis present

## 2015-04-19 DIAGNOSIS — M1990 Unspecified osteoarthritis, unspecified site: Secondary | ICD-10-CM | POA: Diagnosis present

## 2015-04-19 DIAGNOSIS — I482 Chronic atrial fibrillation: Secondary | ICD-10-CM | POA: Diagnosis present

## 2015-04-19 DIAGNOSIS — E785 Hyperlipidemia, unspecified: Secondary | ICD-10-CM | POA: Diagnosis present

## 2015-04-19 DIAGNOSIS — M109 Gout, unspecified: Secondary | ICD-10-CM | POA: Diagnosis present

## 2015-04-19 DIAGNOSIS — K219 Gastro-esophageal reflux disease without esophagitis: Secondary | ICD-10-CM | POA: Diagnosis present

## 2015-04-19 DIAGNOSIS — D631 Anemia in chronic kidney disease: Secondary | ICD-10-CM | POA: Diagnosis present

## 2015-04-19 DIAGNOSIS — I129 Hypertensive chronic kidney disease with stage 1 through stage 4 chronic kidney disease, or unspecified chronic kidney disease: Secondary | ICD-10-CM | POA: Diagnosis present

## 2015-04-19 DIAGNOSIS — Z96651 Presence of right artificial knee joint: Secondary | ICD-10-CM | POA: Diagnosis present

## 2015-04-19 DIAGNOSIS — G4733 Obstructive sleep apnea (adult) (pediatric): Secondary | ICD-10-CM | POA: Diagnosis present

## 2015-04-19 DIAGNOSIS — Z952 Presence of prosthetic heart valve: Secondary | ICD-10-CM

## 2015-04-19 DIAGNOSIS — D696 Thrombocytopenia, unspecified: Secondary | ICD-10-CM | POA: Diagnosis present

## 2015-04-19 DIAGNOSIS — I272 Other secondary pulmonary hypertension: Secondary | ICD-10-CM | POA: Diagnosis present

## 2015-04-19 DIAGNOSIS — N183 Chronic kidney disease, stage 3 (moderate): Secondary | ICD-10-CM | POA: Diagnosis present

## 2015-04-19 DIAGNOSIS — Z87891 Personal history of nicotine dependence: Secondary | ICD-10-CM

## 2015-04-19 DIAGNOSIS — E1122 Type 2 diabetes mellitus with diabetic chronic kidney disease: Secondary | ICD-10-CM | POA: Diagnosis present

## 2015-04-19 DIAGNOSIS — Z7901 Long term (current) use of anticoagulants: Secondary | ICD-10-CM

## 2015-04-19 DIAGNOSIS — Z880 Allergy status to penicillin: Secondary | ICD-10-CM

## 2015-04-19 DIAGNOSIS — R0602 Shortness of breath: Secondary | ICD-10-CM | POA: Diagnosis not present

## 2015-04-19 DIAGNOSIS — I5023 Acute on chronic systolic (congestive) heart failure: Principal | ICD-10-CM | POA: Diagnosis present

## 2015-04-19 DIAGNOSIS — Z66 Do not resuscitate: Secondary | ICD-10-CM | POA: Diagnosis present

## 2015-04-19 DIAGNOSIS — I350 Nonrheumatic aortic (valve) stenosis: Secondary | ICD-10-CM | POA: Diagnosis present

## 2015-04-19 DIAGNOSIS — I2510 Atherosclerotic heart disease of native coronary artery without angina pectoris: Secondary | ICD-10-CM | POA: Diagnosis present

## 2015-04-19 DIAGNOSIS — Z9889 Other specified postprocedural states: Secondary | ICD-10-CM

## 2015-04-19 DIAGNOSIS — Z9981 Dependence on supplemental oxygen: Secondary | ICD-10-CM | POA: Diagnosis not present

## 2015-04-19 DIAGNOSIS — Z888 Allergy status to other drugs, medicaments and biological substances status: Secondary | ICD-10-CM

## 2015-04-19 DIAGNOSIS — I480 Paroxysmal atrial fibrillation: Secondary | ICD-10-CM | POA: Diagnosis present

## 2015-04-19 DIAGNOSIS — Z8249 Family history of ischemic heart disease and other diseases of the circulatory system: Secondary | ICD-10-CM | POA: Diagnosis not present

## 2015-04-19 DIAGNOSIS — F329 Major depressive disorder, single episode, unspecified: Secondary | ICD-10-CM | POA: Diagnosis present

## 2015-04-19 DIAGNOSIS — D649 Anemia, unspecified: Secondary | ICD-10-CM | POA: Diagnosis not present

## 2015-04-19 DIAGNOSIS — Z96642 Presence of left artificial hip joint: Secondary | ICD-10-CM | POA: Diagnosis present

## 2015-04-19 DIAGNOSIS — J9610 Chronic respiratory failure, unspecified whether with hypoxia or hypercapnia: Secondary | ICD-10-CM | POA: Diagnosis present

## 2015-04-19 DIAGNOSIS — J90 Pleural effusion, not elsewhere classified: Secondary | ICD-10-CM | POA: Diagnosis present

## 2015-04-19 LAB — COMPREHENSIVE METABOLIC PANEL
ALT: 13 U/L — AB (ref 17–63)
AST: 27 U/L (ref 15–41)
Albumin: 3.4 g/dL — ABNORMAL LOW (ref 3.5–5.0)
Alkaline Phosphatase: 87 U/L (ref 38–126)
Anion gap: 8 (ref 5–15)
BUN: 51 mg/dL — ABNORMAL HIGH (ref 6–20)
CHLORIDE: 98 mmol/L — AB (ref 101–111)
CO2: 33 mmol/L — AB (ref 22–32)
CREATININE: 1.8 mg/dL — AB (ref 0.61–1.24)
Calcium: 9.1 mg/dL (ref 8.9–10.3)
GFR, EST AFRICAN AMERICAN: 36 mL/min — AB (ref >60)
GFR, EST NON AFRICAN AMERICAN: 31 mL/min — AB (ref >60)
Glucose, Bld: 133 mg/dL — ABNORMAL HIGH (ref 65–99)
POTASSIUM: 4.4 mmol/L (ref 3.5–5.1)
SODIUM: 139 mmol/L (ref 135–145)
Total Bilirubin: 1.2 mg/dL (ref 0.3–1.2)
Total Protein: 7.4 g/dL (ref 6.5–8.1)

## 2015-04-19 LAB — CBC
HEMATOCRIT: 31.8 % — AB (ref 40.0–52.0)
HEMOGLOBIN: 10.4 g/dL — AB (ref 13.0–18.0)
MCH: 33.4 pg (ref 26.0–34.0)
MCHC: 32.8 g/dL (ref 32.0–36.0)
MCV: 101.9 fL — AB (ref 80.0–100.0)
Platelets: 64 10*3/uL — ABNORMAL LOW (ref 150–440)
RBC: 3.12 MIL/uL — AB (ref 4.40–5.90)
RDW: 15.2 % — ABNORMAL HIGH (ref 11.5–14.5)
WBC: 4.1 10*3/uL (ref 3.8–10.6)

## 2015-04-19 LAB — GLUCOSE, CAPILLARY: GLUCOSE-CAPILLARY: 111 mg/dL — AB (ref 65–99)

## 2015-04-19 LAB — URINALYSIS COMPLETE WITH MICROSCOPIC (ARMC ONLY)
BILIRUBIN URINE: NEGATIVE
Glucose, UA: NEGATIVE mg/dL
HGB URINE DIPSTICK: NEGATIVE
Ketones, ur: NEGATIVE mg/dL
Nitrite: NEGATIVE
PH: 5 (ref 5.0–8.0)
PROTEIN: NEGATIVE mg/dL
Specific Gravity, Urine: 1.01 (ref 1.005–1.030)

## 2015-04-19 LAB — PROTIME-INR
INR: 2.33
Prothrombin Time: 25.7 seconds — ABNORMAL HIGH (ref 11.4–15.0)

## 2015-04-19 LAB — APTT: APTT: 39 s — AB (ref 24–36)

## 2015-04-19 MED ORDER — ACETAMINOPHEN 325 MG PO TABS: 650.0000 mg | ORAL_TABLET | Freq: Four times a day (QID) | ORAL | Status: DC | PRN

## 2015-04-19 MED ORDER — POLYETHYLENE GLYCOL 3350 17 G PO PACK: 17.0000 g | PACK | Freq: Every day | ORAL | Status: DC | PRN

## 2015-04-19 MED ORDER — ACETAMINOPHEN 650 MG RE SUPP
650.0000 mg | Freq: Four times a day (QID) | RECTAL | Status: DC | PRN
Start: 2015-04-19 — End: 2015-04-22

## 2015-04-19 MED ORDER — INSULIN ASPART 100 UNIT/ML ~~LOC~~ SOLN: 0.0000 [IU] | Freq: Three times a day (TID) | SUBCUTANEOUS | Status: DC

## 2015-04-19 MED ORDER — SODIUM CHLORIDE 0.9 % IV SOLN: 250.0000 mL | INTRAVENOUS | Status: DC | PRN

## 2015-04-19 MED ORDER — HEPARIN (PORCINE) IN NACL 100-0.45 UNIT/ML-% IJ SOLN
1200.0000 [IU]/h | INTRAMUSCULAR | Status: DC
  Administered 2015-04-19 – 2015-04-20 (×2): 1300 [IU]/h via INTRAVENOUS
  Filled 2015-04-19 (×8): qty 250

## 2015-04-19 MED ORDER — HEPARIN SODIUM (PORCINE) 5000 UNIT/ML IJ SOLN
5000.0000 [IU] | Freq: Three times a day (TID) | INTRAMUSCULAR | Status: DC
  Filled 2015-04-19: qty 1

## 2015-04-19 MED ORDER — DOCUSATE SODIUM 100 MG PO CAPS
100.0000 mg | ORAL_CAPSULE | Freq: Two times a day (BID) | ORAL | Status: DC
  Administered 2015-04-20 – 2015-04-22 (×5): 100 mg via ORAL
  Filled 2015-04-19 (×6): qty 1

## 2015-04-19 MED ORDER — ONDANSETRON HCL 4 MG PO TABS
4.0000 mg | ORAL_TABLET | Freq: Four times a day (QID) | ORAL | Status: DC | PRN
Start: 2015-04-19 — End: 2015-04-22

## 2015-04-19 MED ORDER — FUROSEMIDE 10 MG/ML IJ SOLN
4.0000 mg/h | INTRAVENOUS | Status: DC
  Administered 2015-04-19: 8 mg/h via INTRAVENOUS
  Filled 2015-04-19 (×3): qty 25

## 2015-04-19 MED ORDER — SODIUM CHLORIDE 0.9 % IJ SOLN: 3.0000 mL | INTRAMUSCULAR | Status: DC | PRN

## 2015-04-19 MED ORDER — FUROSEMIDE 10 MG/ML IJ SOLN: 40.0000 mg | Freq: Two times a day (BID) | INTRAMUSCULAR | Status: DC

## 2015-04-19 MED ORDER — ALBUTEROL SULFATE (2.5 MG/3ML) 0.083% IN NEBU: 2.5000 mg | INHALATION_SOLUTION | RESPIRATORY_TRACT | Status: DC | PRN

## 2015-04-19 MED ORDER — INSULIN ASPART 100 UNIT/ML ~~LOC~~ SOLN: 0.0000 [IU] | Freq: Every day | SUBCUTANEOUS | Status: DC

## 2015-04-19 MED ORDER — ONDANSETRON HCL 4 MG/2ML IJ SOLN: 4.0000 mg | Freq: Four times a day (QID) | INTRAMUSCULAR | Status: DC | PRN

## 2015-04-19 MED ORDER — SODIUM CHLORIDE 0.9 % IJ SOLN
3.0000 mL | Freq: Two times a day (BID) | INTRAMUSCULAR | Status: DC
  Administered 2015-04-19 – 2015-04-21 (×2): 3 mL via INTRAVENOUS

## 2015-04-19 MED ORDER — HEPARIN BOLUS VIA INFUSION
4000.0000 [IU] | Freq: Once | INTRAVENOUS | Status: AC
  Administered 2015-04-19: 4000 [IU] via INTRAVENOUS
  Filled 2015-04-19: qty 4000

## 2015-04-19 NOTE — Progress Notes (Signed)
Subjective:   Patient is direct admit from home for failure of outpatient diuretic therapy. He c/o SOB last week. Lasix and metolaxone was adjusted. CXR (PA/LAT) shows rt pleural effusion. Patient felt weak at home, unable to do ADls. Therefore, he is admitted for further E/M Labs reviewed  Objective:  Vital signs in last 24 hours:  Temp:  [97.9 F (36.6 C)] 97.9 F (36.6 C) (09/12 1430) Pulse Rate:  [63] 63 (09/12 1430) Resp:  [16] 16 (09/12 1430) BP: (128)/(55) 128/55 mmHg (09/12 1430) SpO2:  [94 %] 94 % (09/12 1430) Weight:  [84.142 kg (185 lb 8 oz)] 84.142 kg (185 lb 8 oz) (09/12 1430)  Weight change:  Filed Weights   04/19/15 1430  Weight: 84.142 kg (185 lb 8 oz)    Intake/Output:   No intake or output data in the 24 hours ending 04/19/15 1621   Physical Exam: General: NAD, laying in bed  HEENT Moist mucus membranes  Neck supple  Pulm/lungs Diffuse b/l crackles, normal effort, O2 by [place]  CVS/Heart Irregular rhythm  Abdomen:  Soft, nin tender  Extremities: ++ dependent edema  Neurologic: Alert, able to follow commands  Skin: No acute rashes          Basic Metabolic Panel:   Recent Labs Lab 04/19/15 1507  NA 139  K 4.4  CL 98*  CO2 33*  GLUCOSE 133*  BUN 51*  CREATININE 1.80*  CALCIUM 9.1     CBC:  Recent Labs Lab 04/19/15 1507  WBC 4.1  HGB 10.4*  HCT 31.8*  MCV 101.9*  PLT 64*      Microbiology:  No results found for this or any previous visit (from the past 720 hour(s)).  Coagulation Studies:  Recent Labs  04/19/15 1507  LABPROT 25.7*  INR 2.33    Urinalysis: No results for input(s): COLORURINE, LABSPEC, PHURINE, GLUCOSEU, HGBUR, BILIRUBINUR, KETONESUR, PROTEINUR, UROBILINOGEN, NITRITE, LEUKOCYTESUR in the last 72 hours.  Invalid input(s): APPERANCEUR    Imaging: No results found.   Medications:     . docusate sodium  100 mg Oral BID  . furosemide  40 mg Intravenous Q12H  . heparin  5,000 Units Subcutaneous 3  times per day  . insulin aspart  0-5 Units Subcutaneous QHS  . insulin aspart  0-9 Units Subcutaneous TID WC  . sodium chloride  3 mL Intravenous Q12H   sodium chloride, acetaminophen **OR** acetaminophen, albuterol, ondansetron **OR** ondansetron (ZOFRAN) IV, polyethylene glycol, sodium chloride  Assessment/ Plan:  81 y.o. male with multiple complex medical issues including Pacemaker placed December 03, 2008 after syncope from sick sinus syndrome. Anemia of chronic kidney disease , intermittent atrial fibrillation - chronic Anticoagulation, Ch systolic congestive heart failure, Hypertension, , Chronic kidney disease, Diabetes, Gout, History of bladder cancer 1988 - operation. Follows Dr. Rogers Blocker, GERD, Glaucoma Colonoscopy in 2003,  Aortic heart valve replacement Jan 09, Heart Cath jan 2010, surgery for neoplasm on the scalp was done at [facility]. Admission [facility] 06/09/09-06/11/09 for proteus bactermia and ARF., Right knee replacement June 28, 2009 - redo after infected prosthesis removal, left hip fracture - surgical repair by Dr. Marry Guan, Left arm fracture   1. chronic kidney disease stage 3. GFR 31. S Cr close to baseline 2. SOB from combination of Aortic stenosis, Pulmonary hypertension/ severe TR and Peripheral edema -  continue lasix and salt restriction.  - change to continuous iv infusion - labs and electrolytes daily 3. Rt pleural effusion - new - consider pleural tap - cardiology  evaluation     LOS: 0 Sherlin Sonier 9/12/20164:21 PM

## 2015-04-19 NOTE — Progress Notes (Signed)
Patient's home CPAP unit placed in reach and ready for use tonight. Patient stated he didn't need any help getting on.

## 2015-04-19 NOTE — Progress Notes (Signed)
Pt's blood sugar 149 on 2200 check.  Glucometer did not transfer data. [name]

## 2015-04-19 NOTE — Progress Notes (Addendum)
ANTICOAGULATION CONSULT NOTE - Initial Consult  Pharmacy Consult for Heparin Indication: atrial fibrillation  Allergies  Allergen Reactions  . Other Other (See Comments)    Hypotension  . Penicillins Other (See Comments)    Swollen feet    Patient Measurements: Height: 6' (182.9 cm) Weight: 185 lb 8 oz (84.142 kg) IBW/kg (Calculated) : 77.6 Heparin Dosing Weight: 84.1 kg  Vital Signs: Temp: 97.9 F (36.6 C) (09/12 1430) Temp Source: Oral (09/12 1430) BP: 128/55 mmHg (09/12 1430) Pulse Rate: 63 (09/12 1430)  Labs:  Recent Labs  04/19/15 1507  HGB 10.4*  HCT 31.8*  PLT 64*  LABPROT 25.7*  INR 2.33  CREATININE 1.80*    Estimated Creatinine Clearance: 28.1 mL/min (by C-G formula based on Cr of 1.8).   Medical History: Past Medical History  Diagnosis Date  . Arthritis   . Bladder cancer   . Diabetes   . Glaucoma   . Hypertension   . Sleep apnea   . Atrial fibrillation   . CHF (congestive heart failure)   . Atrial fibrillation   . Anemia   . Esophageal reflux   . Dyslipidemia   . LVF (left ventricular failure)   . CAD (coronary artery disease)   . Chronic kidney disease     Medications:  Prescriptions prior to admission  Medication Sig Dispense Refill Last Dose  . carvedilol (COREG) 12.5 MG tablet TAKE 1 TABLET TWICE A DAY   Taking  . Cholecalciferol (VITAMIN D-1000 MAX ST) 1000 UNITS tablet Take by mouth.   Taking  . colchicine 0.6 MG tablet Take by mouth.   Taking  . furosemide (LASIX) 40 MG tablet Take 40 mg by mouth. Alternating 40 mg 1 BID with 40 mg 1 QAM.   Taking  . omeprazole (PRILOSEC) 20 MG capsule TAKE 1 CAPSULE DAILY   Taking  . potassium chloride SA (KLOR-CON M20) 20 MEQ tablet TAKE 1 TABLET DAILY   Taking  . sertraline (ZOLOFT) 50 MG tablet TAKE 1 TABLET AT BEDTIME   Taking  . simvastatin (ZOCOR) 40 MG tablet TAKE 1 TABLET DAILY   Taking  . sitaGLIPtin (JANUVIA) 50 MG tablet TAKE 1 TABLET DAILY   Taking  . tamsulosin (FLOMAX) 0.4 MG  CAPS capsule Take by mouth.   Taking  . warfarin (COUMADIN) 2.5 MG tablet Take by mouth.   Taking  . warfarin (COUMADIN) 5 MG tablet 5 mg   Taking    Assessment: Pharmacy consulted to dose heparin in this 81 yr old male with Afib. CrCl = 28.1 ml/min  Goal of Therapy:  Heparin level 0.3-0.7 units/ml Monitor platelets by anticoagulation protocol: Yes   Plan:  Give 4000 units bolus x 1 Start heparin infusion at 1300 units/hr  Will order baseline aPTT.  Will draw 1st HL 8 hrs after start of drip on 9/13 @ 1:30.   Caree Wolpert D 04/19/2015,5:13 PM

## 2015-04-19 NOTE — H&P (Addendum)
[facility] [place] NAME: [name]    MR#:  [id number]  DATE OF BIRTH:  1920-05-31  DATE OF ADMISSION:  04/19/2015  PRIMARY CARE PHYSICIAN: [name], MD   REQUESTING/REFERRING PHYSICIAN: Dr. Candiss Norse  CHIEF COMPLAINT:  No chief complaint on file.  worsening shortness of breath for 10 days  HISTORY OF PRESENT ILLNESS:  [name]  is a 81 y.o. male with a known history of hypertension,Diabetes A. fib, CHF,CAD and the CKD. The patient was sent by Dr. Candiss Norse for direct admission due to worsening shortness breasts for 10 days. The patient has a chronic respiratory failure on home oxygen 2 L. He has chronic shortness of breath for about one year but shortness breath has been worsening for the past 10 days. In addition, he complains of productive cough but denies any fever, chills or wheezing. He denies any orthopnea, nocturnal dyspnea but has leg edema which is chronic. He denies any weight gain. His chest x-ray so right pleural effusion, so Dr. Keturah Barre and the patient to the hospital for direct admission.  PAST MEDICAL HISTORY:   Past Medical History  Diagnosis Date  . Arthritis   . Bladder cancer   . Diabetes   . Glaucoma   . Hypertension   . Sleep apnea   . Atrial fibrillation   . CHF (congestive heart failure)   . Atrial fibrillation   . Anemia   . Esophageal reflux   . Dyslipidemia   . LVF (left ventricular failure)   . CAD (coronary artery disease)   . Chronic kidney disease     PAST SURGICAL HISTORY:   Past Surgical History  Procedure Laterality Date  . Knee arthroscopy    . Aortic valve replacement    . Left hip hemiarthroplasty      SOCIAL HISTORY:   Social History  Substance Use Topics  . Smoking status: Former Smoker    Types: Cigarettes  . Smokeless tobacco: Not on file  . Alcohol Use: Not on file    FAMILY HISTORY:   Family History  Problem Relation Age of Onset  . Heart attack Father   .  Hypertension Father     DRUG ALLERGIES:   Allergies  Allergen Reactions  . Other Other (See Comments)    Hypotension  . Penicillins Other (See Comments)    Swollen feet    REVIEW OF SYSTEMS:  CONSTITUTIONAL: No fever, generalized weakness.  EYES: No blurred or double vision.  EARS, NOSE, AND THROAT: No tinnitus or ear pain.  RESPIRATORY: Has cough, shortness of breath, no wheezing or hemoptysis.  CARDIOVASCULAR: No chest pain, orthopnea, but has leg edema.  GASTROINTESTINAL: No nausea, vomiting, diarrhea or abdominal pain.  GENITOURINARY: No dysuria, hematuria.  ENDOCRINE: No polyuria, nocturia,  HEMATOLOGY: No anemia, easy bruising or bleeding SKIN: No rash or lesion. MUSCULOSKELETAL: No joint pain or arthritis.   NEUROLOGIC: No tingling, numbness, weakness.  PSYCHIATRY: No anxiety or depression.   MEDICATIONS AT HOME:   Prior to Admission medications   Medication Sig Start Date End Date Taking? Authorizing Provider  carvedilol (COREG) 12.5 MG tablet TAKE 1 TABLET TWICE A DAY 10/12/14   Historical Provider, MD  Cholecalciferol (VITAMIN D-1000 MAX ST) 1000 UNITS tablet Take by mouth.    Historical Provider, MD  colchicine 0.6 MG tablet Take by mouth.    Historical Provider, MD  furosemide (LASIX) 40 MG tablet Take 40 mg by mouth. Alternating 40 mg 1 BID  with 40 mg 1 QAM.    Historical Provider, MD  omeprazole (PRILOSEC) 20 MG capsule TAKE 1 CAPSULE DAILY 02/04/15   Historical Provider, MD  potassium chloride SA (KLOR-CON M20) 20 MEQ tablet TAKE 1 TABLET DAILY 11/10/14   Historical Provider, MD  sertraline (ZOLOFT) 50 MG tablet TAKE 1 TABLET AT BEDTIME 01/22/15   Historical Provider, MD  simvastatin (ZOCOR) 40 MG tablet TAKE 1 TABLET DAILY 02/04/15   Historical Provider, MD  sitaGLIPtin (JANUVIA) 50 MG tablet TAKE 1 TABLET DAILY 09/28/14   Historical Provider, MD  tamsulosin (FLOMAX) 0.4 MG CAPS capsule Take by mouth.    Historical Provider, MD  warfarin (COUMADIN) 2.5 MG tablet Take  by mouth.    Historical Provider, MD  warfarin (COUMADIN) 5 MG tablet 5 mg 06/14/08   Historical Provider, MD      VITAL SIGNS:  Blood pressure 128/55, pulse 63, temperature 97.9 F (36.6 C), temperature source Oral, resp. rate 16, height 6' (1.829 m), weight 84.142 kg (185 lb 8 oz), SpO2 94 %.  PHYSICAL EXAMINATION:  GENERAL:  81 y.o.-year-old patient lying in the bed with no acute distress.  EYES: Pupils equal, round, reactive to light and accommodation. No scleral icterus. Extraocular muscles intact.  HEENT: Head atraumatic, normocephalic. Oropharynx and nasopharynx clear.  NECK:  Supple, no jugular venous distention. No thyroid enlargement, no tenderness.  LUNGS: Normal breath sounds bilaterally, right-sided weak breath sounds, no wheezing, rales,rhonchi or crepitation. No use of accessory muscles of respiration.  CARDIOVASCULAR: S1, S2 normal. No murmurs, rubs, or gallops.  ABDOMEN: Soft, nontender, nondistended. Bowel sounds present. No organomegaly or mass.  EXTREMITIES: Bilateral lower extremity edema 1+, no cyanosis, or clubbing.  NEUROLOGIC: Cranial nerves II through XII are intact. Muscle strength 5/5 in all extremities. Sensation intact. Gait not checked.  PSYCHIATRIC: The patient is alert and oriented x 3.  SKIN: No obvious rash, lesion, or ulcer.   LABORATORY PANEL:   CBC No results for input(s): WBC, HGB, HCT, PLT in the last 168 hours. ------------------------------------------------------------------------------------------------------------------  Chemistries  No results for input(s): NA, K, CL, CO2, GLUCOSE, BUN, CREATININE, CALCIUM, MG, AST, ALT, ALKPHOS, BILITOT in the last 168 hours.  Invalid input(s): GFRCGP ------------------------------------------------------------------------------------------------------------------  Cardiac Enzymes No results for input(s): TROPONINI in the last 168  hours. ------------------------------------------------------------------------------------------------------------------  RADIOLOGY:  No results found.  EKG:   Orders placed or performed in visit on 08/18/14  . EKG 12-Lead    IMPRESSION AND PLAN:   Right pleural effusion Chronic respiratory failure on home oxygen 2 L Chronic A. fib on Coumadin Chronic CHF Hypertension Diabetes CKD stage IV  I will start Lasix drip, hold metolazone, continue oxygen by nasal cannular, follow-up BMP and Dr. Candiss Norse. I will hold Coumadin for thoracentesis, follow-up INR, start heparin drip.  Continue Coreg and Lasix. For diabetes, I will start sliding scale and hold Januvia.  All the records are reviewed and case discussed with ED provider. Management plans discussed with the patient, his daughter and they are in agreement. I discussed with Dr. Candiss Norse.  CODE STATUS: DO NOT RESUSCITATE  TOTAL TIME TAKING CARE OF THIS PATIENT: 58 minutes.    Demetrios Loll M.D on 04/19/2015 at 3:11 PM  Between 7am to 6pm - Pager - [phone]  After 6pm go to [email or web address] - password [email or web address] [facility]  [place] [facility]  Office  [phone]  CC: Primary care physician; [name], MD

## 2015-04-19 NOTE — Progress Notes (Signed)
Skin checked by Oren Beckmann. Pt reports no pain. High fall risk. Med rec requested. 2 L of oxygen. Paced. A & O. Nephro consult. IV lasix. Pt has no further concerns at this time.

## 2015-04-20 ENCOUNTER — Inpatient Hospital Stay: Payer: Medicare PPO

## 2015-04-20 LAB — RENAL FUNCTION PANEL
ALBUMIN: 3.3 g/dL — AB (ref 3.5–5.0)
Anion gap: 9 (ref 5–15)
BUN: 51 mg/dL — AB (ref 6–20)
CALCIUM: 9.2 mg/dL (ref 8.9–10.3)
CO2: 33 mmol/L — ABNORMAL HIGH (ref 22–32)
CREATININE: 1.7 mg/dL — AB (ref 0.61–1.24)
Chloride: 99 mmol/L — ABNORMAL LOW (ref 101–111)
GFR calc Af Amer: 38 mL/min — ABNORMAL LOW (ref >60)
GFR, EST NON AFRICAN AMERICAN: 33 mL/min — AB (ref >60)
Glucose, Bld: 109 mg/dL — ABNORMAL HIGH (ref 65–99)
PHOSPHORUS: 3.3 mg/dL (ref 2.5–4.6)
Potassium: 3.9 mmol/L (ref 3.5–5.1)
SODIUM: 141 mmol/L (ref 135–145)

## 2015-04-20 LAB — GLUCOSE, CAPILLARY
GLUCOSE-CAPILLARY: 117 mg/dL — AB (ref 65–99)
GLUCOSE-CAPILLARY: 129 mg/dL — AB (ref 65–99)
Glucose-Capillary: 100 mg/dL — ABNORMAL HIGH (ref 65–99)

## 2015-04-20 LAB — CBC
HEMATOCRIT: 30.6 % — AB (ref 40.0–52.0)
Hemoglobin: 10.2 g/dL — ABNORMAL LOW (ref 13.0–18.0)
MCH: 33.6 pg (ref 26.0–34.0)
MCHC: 33.3 g/dL (ref 32.0–36.0)
MCV: 100.8 fL — ABNORMAL HIGH (ref 80.0–100.0)
PLATELETS: 43 10*3/uL — AB (ref 150–440)
RBC: 3.04 MIL/uL — ABNORMAL LOW (ref 4.40–5.90)
RDW: 15 % — AB (ref 11.5–14.5)
WBC: 3.8 10*3/uL (ref 3.8–10.6)

## 2015-04-20 LAB — MAGNESIUM: MAGNESIUM: 1.7 mg/dL (ref 1.7–2.4)

## 2015-04-20 LAB — HEPARIN LEVEL (UNFRACTIONATED)
Heparin Unfractionated: 0.48 IU/mL (ref 0.30–0.70)
Heparin Unfractionated: 0.5 IU/mL (ref 0.30–0.70)

## 2015-04-20 LAB — HEMOGLOBIN A1C: Hgb A1c MFr Bld: 6.6 % — ABNORMAL HIGH (ref 4.0–6.0)

## 2015-04-20 MED ORDER — VITAMIN D 1000 UNITS PO TABS
1000.0000 [IU] | ORAL_TABLET | ORAL | Status: DC
  Administered 2015-04-20 – 2015-04-22 (×2): 1000 [IU] via ORAL
  Filled 2015-04-20 (×2): qty 1

## 2015-04-20 MED ORDER — TAMSULOSIN HCL 0.4 MG PO CAPS
0.4000 mg | ORAL_CAPSULE | Freq: Every day | ORAL | Status: DC
  Filled 2015-04-20: qty 1

## 2015-04-20 MED ORDER — ALLOPURINOL 100 MG PO TABS: 100.0000 mg | ORAL_TABLET | Freq: Every day | ORAL | Status: DC | PRN

## 2015-04-20 MED ORDER — VITAMIN B-12 1000 MCG PO TABS
1000.0000 ug | ORAL_TABLET | Freq: Every day | ORAL | Status: DC
  Administered 2015-04-20 – 2015-04-22 (×3): 1000 ug via ORAL
  Filled 2015-04-20 (×3): qty 1

## 2015-04-20 MED ORDER — FERROUS SULFATE 325 (65 FE) MG PO TABS
325.0000 mg | ORAL_TABLET | Freq: Every day | ORAL | Status: DC
  Administered 2015-04-20 – 2015-04-22 (×3): 325 mg via ORAL
  Filled 2015-04-20 (×3): qty 1

## 2015-04-20 MED ORDER — CARVEDILOL 12.5 MG PO TABS
12.5000 mg | ORAL_TABLET | Freq: Two times a day (BID) | ORAL | Status: DC
  Administered 2015-04-20 – 2015-04-22 (×5): 12.5 mg via ORAL
  Filled 2015-04-20 (×5): qty 1

## 2015-04-20 MED ORDER — SIMVASTATIN 40 MG PO TABS
40.0000 mg | ORAL_TABLET | Freq: Every day | ORAL | Status: DC
  Administered 2015-04-20 – 2015-04-21 (×2): 40 mg via ORAL
  Filled 2015-04-20 (×2): qty 1

## 2015-04-20 MED ORDER — SERTRALINE HCL 50 MG PO TABS
50.0000 mg | ORAL_TABLET | Freq: Every day | ORAL | Status: DC
  Administered 2015-04-20 – 2015-04-21 (×2): 50 mg via ORAL
  Filled 2015-04-20 (×2): qty 1

## 2015-04-20 MED ORDER — PANTOPRAZOLE SODIUM 40 MG PO TBEC
40.0000 mg | DELAYED_RELEASE_TABLET | Freq: Every day | ORAL | Status: DC
  Administered 2015-04-20 – 2015-04-22 (×3): 40 mg via ORAL
  Filled 2015-04-20 (×3): qty 1

## 2015-04-20 MED ORDER — GLUCERNA SHAKE PO LIQD
237.0000 mL | Freq: Two times a day (BID) | ORAL | Status: DC
  Administered 2015-04-21 (×2): 237 mL via ORAL

## 2015-04-20 NOTE — Progress Notes (Signed)
[place] [facility] [place] NAME: [name]    MR#:  [id number]  DATE OF BIRTH:  1919/12/24  SUBJECTIVE:  CHIEF COMPLAINT:  No chief complaint on file.  patient here due to shortness of breath and also a right-sided pleural effusion. Thought to be volume overloaded and therefore started on a Lasix drip to and has responded well. Clinically feels better.  Family at bedside  REVIEW OF SYSTEMS:    Review of Systems  Constitutional: Negative for fever and chills.  HENT: Negative for congestion and tinnitus.   Eyes: Negative for blurred vision and double vision.  Respiratory: Positive for shortness of breath (improved). Negative for cough and wheezing.   Cardiovascular: Negative for chest pain, orthopnea and PND.  Gastrointestinal: Negative for nausea, vomiting, abdominal pain and diarrhea.  Genitourinary: Negative for dysuria and hematuria.  Neurological: Negative for dizziness, sensory change and focal weakness.  All other systems reviewed and are negative.   Nutrition: Heart healthy Tolerating Diet: Yes Tolerating PT: Ambulatory   DRUG ALLERGIES:   Allergies  Allergen Reactions  . Lidocaine Other (See Comments)    Reaction:  Hypotension   . Penicillins Other (See Comments)    Reaction:  Swollen feet     VITALS:  Blood pressure 158/71, pulse 65, temperature 98 F (36.7 C), temperature source Oral, resp. rate 20, height 6' (1.829 m), weight 84.142 kg (185 lb 8 oz), SpO2 95 %.  PHYSICAL EXAMINATION:   Physical Exam  GENERAL:  81 y.o.-year-old patient sitting up in chair in no acute distress.  EYES: Pupils equal, round, reactive to light and accommodation. No scleral icterus. Extraocular muscles intact.  HEENT: Head atraumatic, normocephalic. Oropharynx and nasopharynx clear.  NECK:  Supple, no jugular venous distention. No thyroid enlargement, no tenderness.  LUNGS: Normal breath sounds bilaterally, no wheezing, rhonchi.  Rales at the right base. No use of accessory muscles of respiration.  CARDIOVASCULAR: S1, S2 normal. 2/6 diastolic murmur heard at the left sternal border, no rubs, or gallops.  ABDOMEN: Soft, nontender, nondistended. Bowel sounds present. No organomegaly or mass.  EXTREMITIES: No cyanosis, clubbing or edema b/l.    NEUROLOGIC: Cranial nerves II through XII are intact. No focal Motor or sensory deficits b/l.   PSYCHIATRIC: The patient is alert and oriented x 3. Good affect SKIN: No obvious rash, lesion, or ulcer.    LABORATORY PANEL:   CBC  Recent Labs Lab 04/20/15 0201  WBC 3.8  HGB 10.2*  HCT 30.6*  PLT 43*   ------------------------------------------------------------------------------------------------------------------  Chemistries   Recent Labs Lab 04/19/15 1507 04/20/15 0201 04/20/15 0443  NA 139  --  141  K 4.4  --  3.9  CL 98*  --  99*  CO2 33*  --  33*  GLUCOSE 133*  --  109*  BUN 51*  --  51*  CREATININE 1.80*  --  1.70*  CALCIUM 9.1  --  9.2  MG  --  1.7  --   AST 27  --   --   ALT 13*  --   --   ALKPHOS 87  --   --   BILITOT 1.2  --   --    ------------------------------------------------------------------------------------------------------------------  Cardiac Enzymes No results for input(s): TROPONINI in the last 168 hours. ------------------------------------------------------------------------------------------------------------------  RADIOLOGY:  No results found.   ASSESSMENT AND PLAN:   81 year old male with past medical history of cardiomyopathy ischemic, chronic systolic CHF, chronic atrial fibrillation, GERD, chronic kidney disease  stage III, diabetes, glaucoma, history of bladder cancer, history of obstructive sleep apnea who presented to the hospital due to shortness of breath and noted to have a right-sided pleural effusion.  #1 CHF-acute on chronic systolic dysfunction. Patient was not responding to aggressive oral diuretics as an  outpatient and therefore admitted and started on IV Lasix drip. -Patient has responded well to the Lasix drip and clinically feels better -Continue to follow I's and O's and daily weights and renal function closely. -Continue Coreg. Patient not on ACE inhibitor due to chronic kidney disease.  #2 right-sided pleural effusion-this was noticed on the chest x-ray done as an outpatient. -Patient has responded to Lasix well and therefore repeat chest x-ray today. If needed consider therapeutic/diagnostic thoracentesis.  #3 chronic kidney disease stage III-appreciate nephrology input. Renal function closely baseline and will continue to follow as patient is on aggressive diuresis.  #4 diabetes type 2 with renal complication-continue sliding scale insulin.  #5 hypertension-continue Coreg  #6 history of gout-continue allopurinol.  #7 depression-continue Zoloft.  #8 BPH-continue Flomax.  #9 hyperlipidemia-continue simvastatin.     All the records are reviewed and case discussed with Care Management/Social Workerr. Management plans discussed with the patient, family and they are in agreement.  CODE STATUS: DO NOT RESUSCITATE  DVT Prophylaxis: Heparin drip  TOTAL TIME TAKING CARE OF THIS PATIENT: 30 minutes.   POSSIBLE D/C IN 2-3 DAYS, DEPENDING ON CLINICAL CONDITION.   Henreitta Leber M.D on 04/20/2015 at 10:46 AM  Between 7am to 6pm - Pager - [phone]  After 6pm go to [email or web address] - password [email or web address] [facility]  [place] [facility]  Office  [phone]  CC: Primary care physician; [name], MD

## 2015-04-20 NOTE — Consult Note (Signed)
[facility] Cardiology  CARDIOLOGY CONSULT NOTE  Patient ID: [name] MRN: [id number] DOB/AGE: 12/30/1919 81 y.o.  Admit date: 04/19/2015 Referring Physician [facility] Primary Physician [name] M.D. Primary Cardiologist Miquel Dunn M.D. Reason for Consultation diastolic congestive heart failure  HPI: 81 year old gentleman referred for evaluation of diastolic congestive heart failure. Patient is a history of essential hypertension, chronic diastolic congestive heart failure, sick sinus syndrome status post pacemaker, paroxysmal atrial fibrillation, and chronic kidney disease. Patient has had previous hospitalizations for acute on chronic diastolic congestive heart failure, resistant to by mouth furosemide, responding to intravenous furosemide with diuresis. The patient has had a one to two-week history of systems peripheral edema, with increasing shortness of breath, oxygen. Patient was seen by Dr. Candiss Norse for chronic kidney disease, with recent chest x-ray revealing right pleural effusion. Patient is now admitted for torsemide drip, with possible right thoracentesis. Overnight, the patient has responded to furosemide drip with diuresis and clinical improvement with decreased shortness of breath.  Review of systems complete and found to be negative unless listed above     Past Medical History  Diagnosis Date  . Arthritis   . Bladder cancer   . Diabetes   . Glaucoma   . Hypertension   . Sleep apnea   . Atrial fibrillation   . CHF (congestive heart failure)   . Atrial fibrillation   . Anemia   . Esophageal reflux   . Dyslipidemia   . LVF (left ventricular failure)   . CAD (coronary artery disease)   . Chronic kidney disease     Past Surgical History  Procedure Laterality Date  . Knee arthroscopy    . Aortic valve replacement    . Left hip hemiarthroplasty      Prescriptions prior to admission  Medication Sig Dispense Refill Last Dose  . allopurinol (ZYLOPRIM) 100 MG tablet  Take 100 mg by mouth daily as needed (for gout flares).   PRN at PRN  . carvedilol (COREG) 12.5 MG tablet Take 12.5 mg by mouth 2 (two) times daily.   unknown at unknown  . cholecalciferol (VITAMIN D) 1000 UNITS tablet Take 1,000 Units by mouth every other day.   unknown at unknown  . ferrous sulfate 325 (65 FE) MG tablet Take 325 mg by mouth daily.   unknown at unknown  . furosemide (LASIX) 40 MG tablet Take 40 mg by mouth every evening.   unknown at unknown  . metolazone (ZAROXOLYN) 2.5 MG tablet Take 2.5 mg by mouth daily as needed (for edema).   PRN at PRN  . omeprazole (PRILOSEC) 20 MG capsule Take 20 mg by mouth daily.   unknown at unknown  . oxyCODONE-acetaminophen (PERCOCET/ROXICET) 5-325 MG per tablet Take 1 tablet by mouth every 4 (four) hours as needed for severe pain.   PRN at PRN  . potassium chloride SA (K-DUR,KLOR-CON) 20 MEQ tablet Take 20 mEq by mouth daily.   unknown at unknown  . sertraline (ZOLOFT) 50 MG tablet Take 50 mg by mouth at bedtime.   unknown at unknown  . simvastatin (ZOCOR) 40 MG tablet Take 40 mg by mouth at bedtime.   unknown at unknown  . sitaGLIPtin (JANUVIA) 50 MG tablet Take 50 mg by mouth at bedtime.   unknown at unknown  . vitamin B-12 (CYANOCOBALAMIN) 1000 MCG tablet Take 1,000 mcg by mouth daily.   unknown at unknown  . warfarin (COUMADIN) 2.5 MG tablet Take 2.5 mg by mouth daily. Pt takes on Monday, Wednesday, Friday, and  Saturday.   unknown at unknown  . warfarin (COUMADIN) 5 MG tablet Take 5 mg by mouth daily. Pt takes on Tuesday, Thursday, and Sunday.   unknown at unknown   Social History   Social History  . Marital Status: Married    Spouse Name: N/A  . Number of Children: N/A  . Years of Education: N/A   Occupational History  . Not on file.   Social History Main Topics  . Smoking status: Former Smoker    Types: Cigarettes  . Smokeless tobacco: Not on file  . Alcohol Use: Not on file  . Drug Use: Not on file  . Sexual Activity: Not on  file   Other Topics Concern  . Not on file   Social History Narrative    Family History  Problem Relation Age of Onset  . Heart attack Father   . Hypertension Father       Review of systems complete and found to be negative unless listed above      PHYSICAL EXAM  General: Well developed, well nourished, in no acute distress HEENT:  Normocephalic and atramatic Neck:  No JVD.  Lungs: decreased breath sounds right base Heart: HRRR . Normal S1 and S2 without gallops or murmurs.  Abdomen: Bowel sounds are positive, abdomen soft and non-tender  Msk:  Back normal, normal gait. Normal strength and tone for age. Extremities: 1-2+ bilateral pedal edema Neuro: Alert and oriented X 3. Psych:  Good affect, responds appropriately  Labs:   Lab Results  Component Value Date   WBC 3.8 04/20/2015   HGB 10.2* 04/20/2015   HCT 30.6* 04/20/2015   MCV 100.8* 04/20/2015   PLT 43* 04/20/2015    Recent Labs Lab 04/19/15 1507 04/20/15 0443  NA 139 141  K 4.4 3.9  CL 98* 99*  CO2 33* 33*  BUN 51* 51*  CREATININE 1.80* 1.70*  CALCIUM 9.1 9.2  PROT 7.4  --   BILITOT 1.2  --   ALKPHOS 87  --   ALT 13*  --   AST 27  --   GLUCOSE 133* 109*   Lab Results  Component Value Date   TROPONINI 0.12* 07/24/2014   No results found for: CHOL No results found for: HDL No results found for: LDLCALC No results found for: TRIG No results found for: CHOLHDL No results found for: LDLDIRECT    Radiology: Dg Chest 2 View  04/20/2015   CLINICAL DATA:  Shortness of breath.  EXAM: CHEST  2 VIEW  COMPARISON:  04/16/2015  FINDINGS: Left pacer remains in place, unchanged. Prior median sternotomy. Moderate right pleural effusion with right lower lobe atelectasis or infiltrate. Suspect small left pleural effusion. No confluent opacity on the left. Heart is enlarged. Aortic calcifications. No acute bony abnormality.  IMPRESSION: Moderate right pleural effusion with right lower lobe atelectasis or  infiltrate, similar to prior study.  Cardiomegaly.  Small left pleural effusion.  No overall change since prior study.   Electronically Signed   By: Rolm Baptise M.D.   On: 04/20/2015 12:04   Dg Chest 2 View  04/16/2015   CLINICAL DATA:  CHF. Shortness of breath. Worsening each day. Evaluate for fluid on the lungs. History of diabetes and smoking.  EXAM: CHEST  2 VIEW  COMPARISON:  07/24/2014 and earlier exams including 11/28/2008  FINDINGS: Left-sided pacemaker leads to the right atrium and right ventricle. Valve replacement. The heart is enlarged. Moderate right pleural effusion and right lung base density obscures the  hemidiaphragm, significantly increased since prior study. There is minimal atelectasis or small infiltrate left lung base. Small left pleural effusion is also noted. No significant pulmonary edema.  IMPRESSION: 1. Cardiomegaly. 2. Increased right pleural effusion right basilar opacity. 3. Small left effusion and left basilar atelectasis or infiltrate.   Electronically Signed   By: Nolon Nations M.D.   On: 04/16/2015 13:48    EKG: paced rhythm  ASSESSMENT AND PLAN:   1. Acute on chronic diastolic congestive heart failure 2. Chronic kidney disease 3. Paroxysmal atrial fibrillation,chads Vascor of 4 on warfarin 4. Sick sinus syndrome status post dual-chamber pacemaker  Recommendations  1. Agree with overall current therapy 2. Continue furosemide drip 3. Closely monitor renal status 4. Defer further cardiac diagnostics at this time  Signed: Chonita Gadea MD,PhD, [facility] 04/20/2015, 6:33 PM

## 2015-04-20 NOTE — Progress Notes (Signed)
ANTICOAGULATION CONSULT NOTE - Initial Consult  Pharmacy Consult for Heparin Indication: atrial fibrillation  Allergies  Allergen Reactions  . Lidocaine Other (See Comments)    Reaction:  Hypotension   . Penicillins Other (See Comments)    Reaction:  Swollen feet     Patient Measurements: Height: 6' (182.9 cm) Weight: 185 lb 8 oz (84.142 kg) IBW/kg (Calculated) : 77.6 Heparin Dosing Weight: 84.1 kg  Vital Signs: Temp: 98.2 F (36.8 C) (09/12 2004) Temp Source: Oral (09/12 2004) BP: 134/57 mmHg (09/12 2004) Pulse Rate: 59 (09/12 2004)  Labs:  Recent Labs  04/19/15 1507 04/19/15 1722 04/20/15 0201  HGB 10.4*  --  10.2*  HCT 31.8*  --  30.6*  PLT 64*  --  43*  APTT  --  39*  --   LABPROT 25.7*  --   --   INR 2.33  --   --   HEPARINUNFRC  --   --  0.48  CREATININE 1.80*  --   --     Estimated Creatinine Clearance: 28.1 mL/min (by C-G formula based on Cr of 1.8).   Medical History: Past Medical History  Diagnosis Date  . Arthritis   . Bladder cancer   . Diabetes   . Glaucoma   . Hypertension   . Sleep apnea   . Atrial fibrillation   . CHF (congestive heart failure)   . Atrial fibrillation   . Anemia   . Esophageal reflux   . Dyslipidemia   . LVF (left ventricular failure)   . CAD (coronary artery disease)   . Chronic kidney disease     Medications:  Prescriptions prior to admission  Medication Sig Dispense Refill Last Dose  . allopurinol (ZYLOPRIM) 100 MG tablet Take 100 mg by mouth daily as needed (for gout flares).   PRN at PRN  . carvedilol (COREG) 12.5 MG tablet Take 12.5 mg by mouth 2 (two) times daily.   unknown at unknown  . cholecalciferol (VITAMIN D) 1000 UNITS tablet Take 1,000 Units by mouth every other day.   unknown at unknown  . ferrous sulfate 325 (65 FE) MG tablet Take 325 mg by mouth daily.   unknown at unknown  . furosemide (LASIX) 40 MG tablet Take 40 mg by mouth every evening.   unknown at unknown  . metolazone (ZAROXOLYN) 2.5  MG tablet Take 2.5 mg by mouth daily as needed (for edema).   PRN at PRN  . omeprazole (PRILOSEC) 20 MG capsule Take 20 mg by mouth daily.   unknown at unknown  . oxyCODONE-acetaminophen (PERCOCET/ROXICET) 5-325 MG per tablet Take 1 tablet by mouth every 4 (four) hours as needed for severe pain.   PRN at PRN  . potassium chloride SA (K-DUR,KLOR-CON) 20 MEQ tablet Take 20 mEq by mouth daily.   unknown at unknown  . sertraline (ZOLOFT) 50 MG tablet Take 50 mg by mouth at bedtime.   unknown at unknown  . simvastatin (ZOCOR) 40 MG tablet Take 40 mg by mouth at bedtime.   unknown at unknown  . sitaGLIPtin (JANUVIA) 50 MG tablet Take 50 mg by mouth at bedtime.   unknown at unknown  . tamsulosin (FLOMAX) 0.4 MG CAPS capsule Take 0.4 mg by mouth daily.   unknown at unknown  . vitamin B-12 (CYANOCOBALAMIN) 1000 MCG tablet Take 1,000 mcg by mouth daily.   unknown at unknown  . warfarin (COUMADIN) 2.5 MG tablet Take 2.5 mg by mouth daily. Pt takes on Monday, Wednesday, Friday, and Saturday.  unknown at unknown  . warfarin (COUMADIN) 5 MG tablet Take 5 mg by mouth daily. Pt takes on Tuesday, Thursday, and Sunday.   unknown at unknown    Assessment: Pharmacy consulted to dose heparin in this 81 yr old male with Afib. CrCl = 28.1 ml/min  Goal of Therapy:  Heparin level 0.3-0.7 units/ml Monitor platelets by anticoagulation protocol: Yes   Plan:  Give 4000 units bolus x 1 Start heparin infusion at 1300 units/hr  Will order baseline aPTT.  Will draw 1st HL 8 hrs after start of drip on 9/13 @ 1:30.   0913 0200 heparin level therapeutic 0.48 will draw confirmatory heparin level in 8 hours.  Laural Benes, Pharm.D.  Clinical Pharmacist 04/20/2015,4:30 AM

## 2015-04-20 NOTE — Care Management (Signed)
Patient presents as a direct admit for worsening shortness of breath and cough for 10 days prior to admission.  Was sent by Dr Candiss Norse (nephrology).  Found to have pleural effusion and concern that patient will require thoracenthesis.  He does have chronic home 02.  He is resident of the [facility] in the independent level of care.  Per Seth Bake- admission coordinator at [facility]- the patient has been doing well on his own.  She reports however, that patient does usually require a short stay in the health care building when discharges from the hospital.  Patient has been sleeping through out the day and CM has not been able to fully assess

## 2015-04-20 NOTE — Progress Notes (Signed)
ANTICOAGULATION CONSULT NOTE - Initial Consult  Pharmacy Consult for Heparin Indication: atrial fibrillation  Allergies  Allergen Reactions  . Lidocaine Other (See Comments)    Reaction:  Hypotension   . Penicillins Other (See Comments)    Reaction:  Swollen feet     Patient Measurements: Height: 6' (182.9 cm) Weight: 185 lb 8 oz (84.142 kg) IBW/kg (Calculated) : 77.6 Heparin Dosing Weight: 84.1 kg  Vital Signs: Temp: 98 F (36.7 C) (09/13 1115) Temp Source: Oral (09/13 1115) BP: 146/59 mmHg (09/13 1115) Pulse Rate: 61 (09/13 1115)  Labs:  Recent Labs  04/19/15 1507 04/19/15 1722 04/20/15 0201 04/20/15 0443 04/20/15 0951  HGB 10.4*  --  10.2*  --   --   HCT 31.8*  --  30.6*  --   --   PLT 64*  --  43*  --   --   APTT  --  39*  --   --   --   LABPROT 25.7*  --   --   --   --   INR 2.33  --   --   --   --   HEPARINUNFRC  --   --  0.48  --  0.50  CREATININE 1.80*  --   --  1.70*  --     Estimated Creatinine Clearance: 29.8 mL/min (by C-G formula based on Cr of 1.7).   Medical History: Past Medical History  Diagnosis Date  . Arthritis   . Bladder cancer   . Diabetes   . Glaucoma   . Hypertension   . Sleep apnea   . Atrial fibrillation   . CHF (congestive heart failure)   . Atrial fibrillation   . Anemia   . Esophageal reflux   . Dyslipidemia   . LVF (left ventricular failure)   . CAD (coronary artery disease)   . Chronic kidney disease     Medications:  Prescriptions prior to admission  Medication Sig Dispense Refill Last Dose  . allopurinol (ZYLOPRIM) 100 MG tablet Take 100 mg by mouth daily as needed (for gout flares).   PRN at PRN  . carvedilol (COREG) 12.5 MG tablet Take 12.5 mg by mouth 2 (two) times daily.   unknown at unknown  . cholecalciferol (VITAMIN D) 1000 UNITS tablet Take 1,000 Units by mouth every other day.   unknown at unknown  . ferrous sulfate 325 (65 FE) MG tablet Take 325 mg by mouth daily.   unknown at unknown  . furosemide  (LASIX) 40 MG tablet Take 40 mg by mouth every evening.   unknown at unknown  . metolazone (ZAROXOLYN) 2.5 MG tablet Take 2.5 mg by mouth daily as needed (for edema).   PRN at PRN  . omeprazole (PRILOSEC) 20 MG capsule Take 20 mg by mouth daily.   unknown at unknown  . oxyCODONE-acetaminophen (PERCOCET/ROXICET) 5-325 MG per tablet Take 1 tablet by mouth every 4 (four) hours as needed for severe pain.   PRN at PRN  . potassium chloride SA (K-DUR,KLOR-CON) 20 MEQ tablet Take 20 mEq by mouth daily.   unknown at unknown  . sertraline (ZOLOFT) 50 MG tablet Take 50 mg by mouth at bedtime.   unknown at unknown  . simvastatin (ZOCOR) 40 MG tablet Take 40 mg by mouth at bedtime.   unknown at unknown  . sitaGLIPtin (JANUVIA) 50 MG tablet Take 50 mg by mouth at bedtime.   unknown at unknown  . tamsulosin (FLOMAX) 0.4 MG CAPS capsule Take 0.4 mg  by mouth daily.   unknown at unknown  . vitamin B-12 (CYANOCOBALAMIN) 1000 MCG tablet Take 1,000 mcg by mouth daily.   unknown at unknown  . warfarin (COUMADIN) 2.5 MG tablet Take 2.5 mg by mouth daily. Pt takes on Monday, Wednesday, Friday, and Saturday.   unknown at unknown  . warfarin (COUMADIN) 5 MG tablet Take 5 mg by mouth daily. Pt takes on Tuesday, Thursday, and Sunday.   unknown at unknown    Assessment: Pharmacy consulted to dose heparin in this 81 yr old male with Afib, on warfarin PTA. Started on heparin in anticipation of possible thoracentesis.  CrCl = 28.1 ml/min  Goal of Therapy:  Heparin level 0.3-0.7 units/ml Monitor platelets by anticoagulation protocol: Yes   Plan:  Current orders for heparin 1300 units/hr. Repeat heparin level remains therapeutic. Will continue current rate, recheck heparin level and CBC with AM labs.  Platelet count: 43 this AM, from 64 on admission. Discussed with Dr. Verdell Carmine. Patient appears to have chronic thrombocytopenia. Will continue heparin drip at this time, MD to order hematology consult.   Rexene Edison,  PharmD Clinical Pharmacist   04/20/2015,2:33 PM

## 2015-04-20 NOTE — Progress Notes (Signed)
Subjective:   Feels a little better overall Coumadin is held in case thoracentesis is necessary Breathing is better Was started on lasix drip last evening- patient reports good response  Objective:  Vital signs in last 24 hours:  Temp:  [97.9 F (36.6 C)-98.2 F (36.8 C)] 98 F (36.7 C) (09/13 0439) Pulse Rate:  [59-65] 65 (09/13 0439) Resp:  [16-20] 20 (09/13 0439) BP: (128-158)/(55-71) 158/71 mmHg (09/13 0439) SpO2:  [94 %-95 %] 95 % (09/13 0439) Weight:  [84.142 kg (185 lb 8 oz)] 84.142 kg (185 lb 8 oz) (09/12 1430)  Weight change:  Filed Weights   04/19/15 1430  Weight: 84.142 kg (185 lb 8 oz)    Intake/Output:    Intake/Output Summary (Last 24 hours) at 04/20/15 1008 Last data filed at 04/20/15 0958  Gross per 24 hour  Intake    358 ml  Output   1300 ml  Net   -942 ml     Physical Exam: General: NAD, laying in bed  HEENT Moist mucus membranes  Neck supple  Pulm/lungs Diffuse b/l crackles, normal effort, O2 by [place]  CVS/Heart Irregular rhythm  Abdomen:  Soft, nin tender  Extremities: ++ dependent edema  Neurologic: Alert, able to follow commands  Skin: No acute rashes          Basic Metabolic Panel:   Recent Labs Lab 04/19/15 1507 04/20/15 0201 04/20/15 0443  NA 139  --  141  K 4.4  --  3.9  CL 98*  --  99*  CO2 33*  --  33*  GLUCOSE 133*  --  109*  BUN 51*  --  51*  CREATININE 1.80*  --  1.70*  CALCIUM 9.1  --  9.2  MG  --  1.7  --   PHOS  --   --  3.3     CBC:  Recent Labs Lab 04/19/15 1507 04/20/15 0201  WBC 4.1 3.8  HGB 10.4* 10.2*  HCT 31.8* 30.6*  MCV 101.9* 100.8*  PLT 64* 43*      Microbiology:  No results found for this or any previous visit (from the past 720 hour(s)).  Coagulation Studies:  Recent Labs  04/19/15 1507  LABPROT 25.7*  INR 2.33    Urinalysis:  Recent Labs  04/19/15 1625  COLORURINE YELLOW*  LABSPEC 1.010  PHURINE 5.0  GLUCOSEU NEGATIVE  HGBUR NEGATIVE  BILIRUBINUR NEGATIVE   KETONESUR NEGATIVE  PROTEINUR NEGATIVE  NITRITE NEGATIVE  LEUKOCYTESUR 2+*      Imaging: No results found.   Medications:   . furosemide (LASIX) infusion 8 mg/hr (04/19/15 1737)  . heparin 1,300 Units/hr (04/19/15 1736)   . docusate sodium  100 mg Oral BID  . insulin aspart  0-5 Units Subcutaneous QHS  . insulin aspart  0-9 Units Subcutaneous TID WC  . sodium chloride  3 mL Intravenous Q12H   sodium chloride, acetaminophen **OR** acetaminophen, albuterol, ondansetron **OR** ondansetron (ZOFRAN) IV, polyethylene glycol, sodium chloride  Assessment/ Plan:  81 y.o. male with multiple complex medical issues including Pacemaker placed December 03, 2008 after syncope from sick sinus syndrome. Anemia of chronic kidney disease , intermittent atrial fibrillation - chronic Anticoagulation, Ch systolic congestive heart failure, Hypertension, , Chronic kidney disease, Diabetes, Gout, History of bladder cancer 1988 - operation. Follows Dr. Rogers Blocker, GERD, Glaucoma Colonoscopy in 2003,  Aortic heart valve replacement Jan 09, Heart Cath jan 2010, surgery for neoplasm on the scalp was done at [facility]. [facility]mission [facility]-06/11/09 for proteus bactermia and  ARF., Right knee replacement June 28, 2009 - redo after infected prosthesis removal, left hip fracture - surgical repair by Dr. Marry Guan, Left arm fracture   1. chronic kidney disease stage 3. GFR 31. S Cr close to baseline 2. SOB from combination of acute on chronic sys CHF, Aortic stenosis, Pulmonary hypertension/ severe TR and  -  continue lasix;  salt restriction.  -  Lasix given as continuous iv infusion -  labs and electrolytes daily 3. Rt pleural effusion - new - consider pleural tap - cardiology evaluation - CXR in AM 4. Peripheral edema - see above    LOS: 1 [name] 9/13/201610:08 AM

## 2015-04-20 NOTE — Progress Notes (Signed)
Initial Nutrition Assessment   INTERVENTION:   Meals and Snacks: Cater to patient preferences; pt eats smaller, more frequent meals. Will order snacks between meals Medical Food Supplement Therapy: recommend Glucerna Shake po BID, each supplement provides 220 kcal and 10 grams of protein   NUTRITION DIAGNOSIS:   No nutrition diagnosis at this time  GOAL:   Patient will meet greater than or equal to 90% of their needs  MONITOR:    (Energy Intake, Anthropometrics, Electrolyte/Renal Profile, Digestive System)  REASON FOR ASSESSMENT:   Diagnosis    ASSESSMENT:    Pt admitted with acute on chronic heart failure, pleural effusions, on lasix drip  Past Medical History  Diagnosis Date  . Arthritis   . Bladder cancer   . Diabetes   . Glaucoma   . Hypertension   . Sleep apnea   . Atrial fibrillation   . CHF (congestive heart failure)   . Atrial fibrillation   . Anemia   . Esophageal reflux   . Dyslipidemia   . LVF (left ventricular failure)   . CAD (coronary artery disease)   . Chronic kidney disease     Diet Order:  Diet heart healthy/carb modified Room service appropriate?: Yes; Fluid consistency:: Thin  Energy Intake: recorded po intake 50% at breakfast, bites at dinner  Food and Nutrition Related history: pt reports chronic poor appetite for past 2-3 years, reports he now eats 6 small meals per day, drinks 1 Glucerna daily  Skin:  Reviewed, no issues  Last BM:  9/12  Electrolyte and Renal Profile:  Recent Labs Lab 04/19/15 1507 04/20/15 0201 04/20/15 0443  BUN 51*  --  51*  CREATININE 1.80*  --  1.70*  NA 139  --  141  K 4.4  --  3.9  MG  --  1.7  --   PHOS  --   --  3.3   Glucose Profile:  Recent Labs  04/19/15 1623 04/20/15 0732 04/20/15 1118  GLUCAP 111* 100* 117*   Protein Profile:  Recent Labs Lab 04/19/15 1507 04/20/15 0443  ALBUMIN 3.4* 3.3*   Meds: ss novolog, lasix  Height: pt reports recent weight gain due to fluid; he  reports his MD wants his weight down to 175 pounds. Pt reports years ago he weighed 200 pounds  Ht Readings from Last 1 Encounters:  04/19/15 6' (1.829 m)    Weight:   Wt Readings from Last 1 Encounters:  04/19/15 185 lb 8 oz (84.142 kg)    BMI:  Body mass index is 25.15 kg/(m^2).  LOW Care Level  Kerman Passey MS, [place], LDN [phone] Pager

## 2015-04-21 DIAGNOSIS — I10 Essential (primary) hypertension: Secondary | ICD-10-CM

## 2015-04-21 DIAGNOSIS — Z79899 Other long term (current) drug therapy: Secondary | ICD-10-CM

## 2015-04-21 DIAGNOSIS — D649 Anemia, unspecified: Secondary | ICD-10-CM

## 2015-04-21 DIAGNOSIS — Z794 Long term (current) use of insulin: Secondary | ICD-10-CM

## 2015-04-21 DIAGNOSIS — I509 Heart failure, unspecified: Secondary | ICD-10-CM

## 2015-04-21 DIAGNOSIS — I2510 Atherosclerotic heart disease of native coronary artery without angina pectoris: Secondary | ICD-10-CM

## 2015-04-21 DIAGNOSIS — R0602 Shortness of breath: Secondary | ICD-10-CM

## 2015-04-21 DIAGNOSIS — Z87891 Personal history of nicotine dependence: Secondary | ICD-10-CM

## 2015-04-21 DIAGNOSIS — E119 Type 2 diabetes mellitus without complications: Secondary | ICD-10-CM

## 2015-04-21 DIAGNOSIS — D696 Thrombocytopenia, unspecified: Secondary | ICD-10-CM

## 2015-04-21 DIAGNOSIS — I4891 Unspecified atrial fibrillation: Secondary | ICD-10-CM

## 2015-04-21 LAB — BASIC METABOLIC PANEL
Anion gap: 12 (ref 5–15)
BUN: 50 mg/dL — AB (ref 6–20)
CO2: 34 mmol/L — ABNORMAL HIGH (ref 22–32)
CREATININE: 1.72 mg/dL — AB (ref 0.61–1.24)
Calcium: 8.9 mg/dL (ref 8.9–10.3)
Chloride: 97 mmol/L — ABNORMAL LOW (ref 101–111)
GFR, EST AFRICAN AMERICAN: 38 mL/min — AB (ref >60)
GFR, EST NON AFRICAN AMERICAN: 33 mL/min — AB (ref >60)
Glucose, Bld: 103 mg/dL — ABNORMAL HIGH (ref 65–99)
Potassium: 3.5 mmol/L (ref 3.5–5.1)
SODIUM: 143 mmol/L (ref 135–145)

## 2015-04-21 LAB — GLUCOSE, CAPILLARY
GLUCOSE-CAPILLARY: 115 mg/dL — AB (ref 65–99)
Glucose-Capillary: 149 mg/dL — ABNORMAL HIGH (ref 65–99)
Glucose-Capillary: 187 mg/dL — ABNORMAL HIGH (ref 65–99)
Glucose-Capillary: 129 mg/dL — ABNORMAL HIGH (ref 65–99)
Glucose-Capillary: 134 mg/dL — ABNORMAL HIGH (ref 65–99)
Glucose-Capillary: 174 mg/dL — ABNORMAL HIGH (ref 65–99)

## 2015-04-21 LAB — CBC
HCT: 30.1 % — ABNORMAL LOW (ref 40.0–52.0)
Hemoglobin: 10.1 g/dL — ABNORMAL LOW (ref 13.0–18.0)
MCH: 34.2 pg — AB (ref 26.0–34.0)
MCHC: 33.6 g/dL (ref 32.0–36.0)
MCV: 101.8 fL — AB (ref 80.0–100.0)
PLATELETS: 51 10*3/uL — AB (ref 150–440)
RBC: 2.95 MIL/uL — ABNORMAL LOW (ref 4.40–5.90)
RDW: 14.7 % — ABNORMAL HIGH (ref 11.5–14.5)
WBC: 3.5 10*3/uL — ABNORMAL LOW (ref 3.8–10.6)

## 2015-04-21 LAB — PROTIME-INR
INR: 2.19
PROTHROMBIN TIME: 24.5 s — AB (ref 11.4–15.0)

## 2015-04-21 LAB — HEPARIN LEVEL (UNFRACTIONATED): HEPARIN UNFRACTIONATED: 0.53 [IU]/mL (ref 0.30–0.70)

## 2015-04-21 LAB — MAGNESIUM: Magnesium: 1.6 mg/dL — ABNORMAL LOW (ref 1.7–2.4)

## 2015-04-21 MED ORDER — MAGNESIUM SULFATE 2 GM/50ML IV SOLN
2.0000 g | Freq: Once | INTRAVENOUS | Status: AC
  Administered 2015-04-21: 2 g via INTRAVENOUS
  Filled 2015-04-21: qty 50

## 2015-04-21 MED ORDER — SODIUM CHLORIDE 0.9 % IJ SOLN: 3.0000 mL | INTRAMUSCULAR | Status: DC | PRN

## 2015-04-21 MED ORDER — POTASSIUM CHLORIDE CRYS ER 20 MEQ PO TBCR
20.0000 meq | EXTENDED_RELEASE_TABLET | Freq: Every day | ORAL | Status: DC
  Administered 2015-04-21 – 2015-04-22 (×2): 20 meq via ORAL
  Filled 2015-04-21 (×2): qty 1

## 2015-04-21 NOTE — Progress Notes (Signed)
ANTICOAGULATION CONSULT NOTE - Initial Consult  Pharmacy Consult for Heparin Indication: atrial fibrillation  Allergies  Allergen Reactions  . Lidocaine Other (See Comments)    Reaction:  Hypotension   . Penicillins Other (See Comments)    Reaction:  Swollen feet     Patient Measurements: Height: 6' (182.9 cm) Weight: 175 lb 14.4 oz (79.788 kg) IBW/kg (Calculated) : 77.6 Heparin Dosing Weight: 84.1 kg  Vital Signs: Temp: 98 F (36.7 C) (09/14 0546) BP: 110/45 mmHg (09/14 0546) Pulse Rate: 59 (09/14 0546)  Labs:  Recent Labs  04/19/15 1507 04/19/15 1722 04/20/15 0201 04/20/15 0443 04/20/15 0951 04/21/15 0413  HGB 10.4*  --  10.2*  --   --  10.1*  HCT 31.8*  --  30.6*  --   --  30.1*  PLT 64*  --  43*  --   --  51*  APTT  --  39*  --   --   --   --   LABPROT 25.7*  --   --   --   --  24.5*  INR 2.33  --   --   --   --  2.19  HEPARINUNFRC  --   --  0.48  --  0.50 0.53  CREATININE 1.80*  --   --  1.70*  --  1.72*    Estimated Creatinine Clearance: 29.5 mL/min (by C-G formula based on Cr of 1.72).   Medical History: Past Medical History  Diagnosis Date  . Arthritis   . Bladder cancer   . Diabetes   . Glaucoma   . Hypertension   . Sleep apnea   . Atrial fibrillation   . CHF (congestive heart failure)   . Atrial fibrillation   . Anemia   . Esophageal reflux   . Dyslipidemia   . LVF (left ventricular failure)   . CAD (coronary artery disease)   . Chronic kidney disease     Medications:  Prescriptions prior to admission  Medication Sig Dispense Refill Last Dose  . allopurinol (ZYLOPRIM) 100 MG tablet Take 100 mg by mouth daily as needed (for gout flares).   PRN at PRN  . carvedilol (COREG) 12.5 MG tablet Take 12.5 mg by mouth 2 (two) times daily.   unknown at unknown  . cholecalciferol (VITAMIN D) 1000 UNITS tablet Take 1,000 Units by mouth every other day.   unknown at unknown  . ferrous sulfate 325 (65 FE) MG tablet Take 325 mg by mouth daily.    unknown at unknown  . furosemide (LASIX) 40 MG tablet Take 40 mg by mouth every evening.   unknown at unknown  . metolazone (ZAROXOLYN) 2.5 MG tablet Take 2.5 mg by mouth daily as needed (for edema).   PRN at PRN  . omeprazole (PRILOSEC) 20 MG capsule Take 20 mg by mouth daily.   unknown at unknown  . oxyCODONE-acetaminophen (PERCOCET/ROXICET) 5-325 MG per tablet Take 1 tablet by mouth every 4 (four) hours as needed for severe pain.   PRN at PRN  . potassium chloride SA (K-DUR,KLOR-CON) 20 MEQ tablet Take 20 mEq by mouth daily.   unknown at unknown  . sertraline (ZOLOFT) 50 MG tablet Take 50 mg by mouth at bedtime.   unknown at unknown  . simvastatin (ZOCOR) 40 MG tablet Take 40 mg by mouth at bedtime.   unknown at unknown  . sitaGLIPtin (JANUVIA) 50 MG tablet Take 50 mg by mouth at bedtime.   unknown at unknown  . vitamin B-12 (  CYANOCOBALAMIN) 1000 MCG tablet Take 1,000 mcg by mouth daily.   unknown at unknown  . warfarin (COUMADIN) 2.5 MG tablet Take 2.5 mg by mouth daily. Pt takes on Monday, Wednesday, Friday, and Saturday.   unknown at unknown  . warfarin (COUMADIN) 5 MG tablet Take 5 mg by mouth daily. Pt takes on Tuesday, Thursday, and Sunday.   unknown at unknown    Assessment: Pharmacy consulted to dose heparin in this 81 yr old male with Afib, on warfarin PTA. Started on heparin in anticipation of possible thoracentesis.  CrCl = 28.1 ml/min  Goal of Therapy:  Heparin level 0.3-0.7 units/ml Monitor platelets by anticoagulation protocol: Yes   Plan:  Current orders for heparin 1300 units/hr. Repeat heparin level remains therapeutic. Will continue current rate, recheck heparin level and CBC with AM labs.  Platelet count: 43 this AM, from 64 on admission. Discussed with Dr. Verdell Carmine. Patient appears to have chronic thrombocytopenia. Will continue heparin drip at this time, MD to order hematology consult.   0914 AM heparin level therapeutic, continue current rate. Will recheck with AM  labs. Plt 51 this AM.   Ovid Curd A. Jordan Hawks, PharmD Clinical Pharmacist   04/21/2015,6:34 AM

## 2015-04-21 NOTE — Progress Notes (Signed)
[facility]  SUBJECTIVE: I'm doing better   Filed Vitals:   04/20/15 1115 04/20/15 2029 04/21/15 0446 04/21/15 0546  BP: 146/59 123/51  110/45  Pulse: 61 60  59  Temp: 98 F (36.7 C) 98.2 F (36.8 C)  98 F (36.7 C)  TempSrc: Oral     Resp: 20 22  20   Height:      Weight:   79.788 kg (175 lb 14.4 oz)   SpO2: 100% 100%  97%     Intake/Output Summary (Last 24 hours) at 04/21/15 0902 Last data filed at 04/21/15 3536  Gross per 24 hour  Intake 472.75 ml  Output   1590 ml  Net -1117.25 ml      PHYSICAL EXAM  General: Well developed, well nourished, in no acute distress HEENT:  Normocephalic and atramatic Neck:  No JVD.  Lungs: Clear bilaterally to auscultation and percussion. Heart: HRRR . Normal S1 and S2 without gallops or murmurs.  Abdomen: Bowel sounds are positive, abdomen soft and non-tender  Msk:  Back normal, normal gait. Normal strength and tone for age. Extremities: 1+ bilateral pedal edema   Neuro: Alert and oriented X 3. Psych:  Good affect, responds appropriately   LABS: Basic Metabolic Panel:  Recent Labs  04/20/15 0201 04/20/15 0443 04/21/15 0413  NA  --  141 143  K  --  3.9 3.5  CL  --  99* 97*  CO2  --  33* 34*  GLUCOSE  --  109* 103*  BUN  --  51* 50*  CREATININE  --  1.70* 1.72*  CALCIUM  --  9.2 8.9  MG 1.7  --   --   PHOS  --  3.3  --    Liver Function Tests:  Recent Labs  04/19/15 1507 04/20/15 0443  AST 27  --   ALT 13*  --   ALKPHOS 87  --   BILITOT 1.2  --   PROT 7.4  --   ALBUMIN 3.4* 3.3*   No results for input(s): LIPASE, AMYLASE in the last 72 hours. CBC:  Recent Labs  04/20/15 0201 04/21/15 0413  WBC 3.8 3.5*  HGB 10.2* 10.1*  HCT 30.6* 30.1*  MCV 100.8* 101.8*  PLT 43* 51*   Cardiac Enzymes: No results for input(s): CKTOTAL, CKMB, CKMBINDEX, TROPONINI in the last 72 hours. BNP: Invalid input(s): POCBNP D-Dimer: No results for input(s): DDIMER in the last 72 hours. Hemoglobin A1C:  Recent  Labs  04/19/15 1504  HGBA1C 6.6*   Fasting Lipid Panel: No results for input(s): CHOL, HDL, LDLCALC, TRIG, CHOLHDL, LDLDIRECT in the last 72 hours. Thyroid Function Tests: No results for input(s): TSH, T4TOTAL, T3FREE, THYROIDAB in the last 72 hours.  Invalid input(s): FREET3 Anemia Panel: No results for input(s): VITAMINB12, FOLATE, FERRITIN, TIBC, IRON, RETICCTPCT in the last 72 hours.  Dg Chest 2 View  04/20/2015   CLINICAL DATA:  Shortness of breath.  EXAM: CHEST  2 VIEW  COMPARISON:  04/16/2015  FINDINGS: Left pacer remains in place, unchanged. Prior median sternotomy. Moderate right pleural effusion with right lower lobe atelectasis or infiltrate. Suspect small left pleural effusion. No confluent opacity on the left. Heart is enlarged. Aortic calcifications. No acute bony abnormality.  IMPRESSION: Moderate right pleural effusion with right lower lobe atelectasis or infiltrate, similar to prior study.  Cardiomegaly.  Small left pleural effusion.  No overall change since prior study.   Electronically Signed   By: Rolm Baptise M.D.   On: 04/20/2015  12:04     Echo   TELEMETRY: Paced rhythm:  ASSESSMENT AND PLAN:  Active Problems:   Pleural effusion    1. Acute on chronic diastolic congestive heart failure 2. Chronic kidney disease 3. Paroxysmal atrial fibrillation, chads Vasc of 4, on warfarin 4. Dual-chamber pacemaker for sick sinus syndrome  Recommendations  1. Agree with current therapy 2. Continue diuresis with furosemide drip 3. Carefully monitor renal status 4. Defer further cardiac diagnostics at this time   Isaias Cowman, MD, PhD, [facility] 04/21/2015 9:02 AM

## 2015-04-21 NOTE — Care Management Important Message (Signed)
Important Message  Patient Details  Name: MURICE BARBAR MRN: [id number] Date of Birth: November 21, 1919   Medicare Important Message Given:  Yes-second notification given    Juliann Pulse A Allmond 04/21/2015, 9:46 AM

## 2015-04-21 NOTE — Progress Notes (Signed)
2 L of oxygen. Paced. FS are stable. Pt reports no pain. Ambulated around the nurses station and tolerated it well. Heparin drip at 13. Lasix drip at 4. Family at the bedside. Pt has no further concerns at this time.

## 2015-04-21 NOTE — Consult Note (Signed)
[facility]  [phone]) [phone] [phone]) [phone]  ID: Glenda Chroman OB: 1920/05/19  MR#: [id number]  FIE#:[id number]  Patient Care Team: [name], MD as PCP - General (Family Medicine)  CHIEF COMPLAINT: Anemia, thrombocytopenia.  INTERVAL HISTORY: Patient is a 81 year old male admitted to the hospital with increasing shortness of breath and fluid overload. He continues to have an oxygen requirement, but states his breathing is significantly better. He has had persistent anemia and thrombocytopenia and was last evaluated in April 2016. He has no neurologic complaints. He denies any easy bleeding or bruising. He denies any fevers. He has a good appetite and denies weight loss. He denies any nausea, vomiting, constipation, or diarrhea. Patient otherwise feels well and offers no further specific complaints.  REVIEW OF SYSTEMS:   Review of Systems  Constitutional: Positive for weight loss.  Respiratory: Positive for shortness of breath.   Cardiovascular: Positive for leg swelling.  Gastrointestinal: Negative.   Endo/Heme/Allergies: Does not bruise/bleed easily.    As per HPI. Otherwise, a complete review of systems is negatve.  PAST MEDICAL HISTORY: Past Medical History  Diagnosis Date  . Arthritis   . Bladder cancer   . Diabetes   . Glaucoma   . Hypertension   . Sleep apnea   . Atrial fibrillation   . CHF (congestive heart failure)   . Atrial fibrillation   . Anemia   . Esophageal reflux   . Dyslipidemia   . LVF (left ventricular failure)   . CAD (coronary artery disease)   . Chronic kidney disease     PAST SURGICAL HISTORY: Past Surgical History  Procedure Laterality Date  . Knee arthroscopy    . Aortic valve replacement    . Left hip hemiarthroplasty      FAMILY HISTORY Family History  Problem Relation Age of Onset  . Heart attack Father   . Hypertension Father        ADVANCED DIRECTIVES:    HEALTH MAINTENANCE: Social History   Substance Use Topics  . Smoking status: Former Smoker    Types: Cigarettes  . Smokeless tobacco: None  . Alcohol Use: None     Colonoscopy:  PAP:  Bone density:  Lipid panel:  Allergies  Allergen Reactions  . Lidocaine Other (See Comments)    Reaction:  Hypotension   . Penicillins Other (See Comments)    Reaction:  Swollen feet     Current Facility-Administered Medications  Medication Dose Route Frequency Provider Last Rate Last Dose  . 0.9 %  sodium chloride infusion  250 mL Intravenous PRN Demetrios Loll, MD      . acetaminophen (TYLENOL) tablet 650 mg  650 mg Oral Q6H PRN Demetrios Loll, MD       Or  . acetaminophen (TYLENOL) suppository 650 mg  650 mg Rectal Q6H PRN Demetrios Loll, MD      . albuterol (PROVENTIL) (2.5 MG/3ML) 0.083% nebulizer solution 2.5 mg  2.5 mg Nebulization Q2H PRN Demetrios Loll, MD      . allopurinol (ZYLOPRIM) tablet 100 mg  100 mg Oral Daily PRN Henreitta Leber, MD      . carvedilol (COREG) tablet 12.5 mg  12.5 mg Oral BID Henreitta Leber, MD   12.5 mg at 04/21/15 1022  . cholecalciferol (VITAMIN D) tablet 1,000 Units  1,000 Units Oral QODAY Henreitta Leber, MD   1,000 Units at 04/20/15 1221  . docusate sodium (COLACE) capsule 100 mg  100 mg Oral BID Demetrios Loll, MD  100 mg at 04/21/15 1023  . feeding supplement (GLUCERNA SHAKE) (GLUCERNA SHAKE) liquid 237 mL  237 mL Oral q12n4p Henreitta Leber, MD   237 mL at 04/21/15 1200  . ferrous sulfate tablet 325 mg  325 mg Oral Daily Henreitta Leber, MD   325 mg at 04/21/15 1022  . furosemide (LASIX) 250 mg in dextrose 5 % 250 mL (1 mg/mL) infusion  4 mg/hr Intravenous Continuous Harmeet Singh, MD 4 mL/hr at 04/21/15 1115 4 mg/hr at 04/21/15 1115  . heparin ADULT infusion 100 units/mL (25000 units/250 mL)  1,300 Units/hr Intravenous Continuous Demetrios Loll, MD 13 mL/hr at 04/20/15 1409 1,300 Units/hr at 04/20/15 1409  . insulin aspart (novoLOG) injection 0-5 Units  0-5 Units Subcutaneous QHS Demetrios Loll, MD   0 Units at 04/19/15 2148    . insulin aspart (novoLOG) injection 0-9 Units  0-9 Units Subcutaneous TID WC Demetrios Loll, MD   0 Units at 04/19/15 1700  . magnesium sulfate IVPB 2 g 50 mL  2 g Intravenous Once Demetrios Loll, MD   2 g at 04/21/15 1545  . ondansetron (ZOFRAN) tablet 4 mg  4 mg Oral Q6H PRN Demetrios Loll, MD       Or  . ondansetron [facility]) injection 4 mg  4 mg Intravenous Q6H PRN Demetrios Loll, MD      . pantoprazole (PROTONIX) EC tablet 40 mg  40 mg Oral Daily Henreitta Leber, MD   40 mg at 04/21/15 1022  . polyethylene glycol (MIRALAX / GLYCOLAX) packet 17 g  17 g Oral Daily PRN Demetrios Loll, MD      . potassium chloride SA (K-DUR,KLOR-CON) CR tablet 20 mEq  20 mEq Oral Daily Demetrios Loll, MD   20 mEq at 04/21/15 1700  . sertraline (ZOLOFT) tablet 50 mg  50 mg Oral QHS Henreitta Leber, MD   50 mg at 04/20/15 2108  . simvastatin (ZOCOR) tablet 40 mg  40 mg Oral QHS Henreitta Leber, MD   40 mg at 04/20/15 2107  . sodium chloride 0.9 % injection 3 mL  3 mL Intravenous Q12H Demetrios Loll, MD   3 mL at 04/21/15 1000  . sodium chloride 0.9 % injection 3 mL  3 mL Intravenous PRN Demetrios Loll, MD      . vitamin B-12 (CYANOCOBALAMIN) tablet 1,000 mcg  1,000 mcg Oral Daily Henreitta Leber, MD   1,000 mcg at 04/21/15 1022    OBJECTIVE: Filed Vitals:   04/21/15 1143  BP: 115/48  Pulse: 63  Temp: 97.8 F (36.6 C)  Resp: 17     Body mass index is 23.85 kg/(m^2).    ECOG FS:1 - Symptomatic but completely ambulatory  General: Well-developed, well-nourished, no acute distress. Eyes: Pink conjunctiva, anicteric sclera. HEENT: Normocephalic, moist mucous membranes, clear oropharnyx. Lungs: Clear to auscultation bilaterally. Heart: Regular rate and rhythm. No rubs, murmurs, or gallops. Abdomen: Soft, nontender, nondistended. No organomegaly noted, normoactive bowel sounds. Musculoskeletal: No edema, cyanosis, or clubbing. Neuro: Alert, answering all questions appropriately. Cranial nerves grossly intact. Skin: No rashes or petechiae  noted. Psych: Normal affect. Lymphatics: No cervical, calvicular, axillary or inguinal LAD.   LAB RESULTS:  Lab Results  Component Value Date   NA 143 04/21/2015   K 3.5 04/21/2015   CL 97* 04/21/2015   CO2 34* 04/21/2015   GLUCOSE 103* 04/21/2015   BUN 50* 04/21/2015   CREATININE 1.72* 04/21/2015   CALCIUM 8.9 04/21/2015   PROT 7.4 04/19/2015  ALBUMIN 3.3* 04/20/2015   AST 27 04/19/2015   ALT 13* 04/19/2015   ALKPHOS 87 04/19/2015   BILITOT 1.2 04/19/2015   GFRNONAA 33* 04/21/2015   GFRAA 38* 04/21/2015    Lab Results  Component Value Date   WBC 3.5* 04/21/2015   NEUTROABS 3.0 11/09/2014   HGB 10.1* 04/21/2015   HCT 30.1* 04/21/2015   MCV 101.8* 04/21/2015   PLT 51* 04/21/2015     STUDIES: Dg Chest 2 View  04/20/2015   CLINICAL DATA:  Shortness of breath.  EXAM: CHEST  2 VIEW  COMPARISON:  04/16/2015  FINDINGS: Left pacer remains in place, unchanged. Prior median sternotomy. Moderate right pleural effusion with right lower lobe atelectasis or infiltrate. Suspect small left pleural effusion. No confluent opacity on the left. Heart is enlarged. Aortic calcifications. No acute bony abnormality.  IMPRESSION: Moderate right pleural effusion with right lower lobe atelectasis or infiltrate, similar to prior study.  Cardiomegaly.  Small left pleural effusion.  No overall change since prior study.   Electronically Signed   By: Rolm Baptise M.D.   On: 04/20/2015 12:04   Dg Chest 2 View  04/16/2015   CLINICAL DATA:  CHF. Shortness of breath. Worsening each day. Evaluate for fluid on the lungs. History of diabetes and smoking.  EXAM: CHEST  2 VIEW  COMPARISON:  07/24/2014 and earlier exams including 11/28/2008  FINDINGS: Left-sided pacemaker leads to the right atrium and right ventricle. Valve replacement. The heart is enlarged. Moderate right pleural effusion and right lung base density obscures the hemidiaphragm, significantly increased since prior study. There is minimal  atelectasis or small infiltrate left lung base. Small left pleural effusion is also noted. No significant pulmonary edema.  IMPRESSION: 1. Cardiomegaly. 2. Increased right pleural effusion right basilar opacity. 3. Small left effusion and left basilar atelectasis or infiltrate.   Electronically Signed   By: Nolon Nations M.D.   On: 04/16/2015 13:48    ASSESSMENT:   PLAN:    1. Anemia: Secondary to chronic renal insufficiency. Appreciate nephrology input. Patient's hemoglobin is greater than 10.0, therefore he does not require additional Procrit today. Previously his iron stores were also improved. Patient has received IV Feraheme in the past. It is possible patient has underlying MDS, but this would take a bone marrow biopsy to diagnose which is unnecessary at this point. Treatment would be supportive given his advanced age. Of note, a bone marrow from patient's hip fracture in 2015 was reported as within normal limits. 2. Thrombocytopenia: Decreased from patient's baseline of approximately 100. Although, patient's platelet count has ranged from 55-150 over the past year.  Possible MDS as above. No intervention needed at this time. 3. Disposition: Patient has been instructed to keep his previously scheduled follow-up appointment in the [place] in the next several weeks.  Appreciate consult, will follow.  Lloyd Huger, MD   04/21/2015 4:06 PM

## 2015-04-21 NOTE — Progress Notes (Signed)
[place] [facility] [place] NAME: [name]    MR#:  [id number]  DATE OF BIRTH:  06/14/1920  SUBJECTIVE:  CHIEF COMPLAINT:   Better shortness of breath, on Lasix drip.   REVIEW OF SYSTEMS:    Review of Systems  Constitutional: Negative for fever and chills.  HENT: Negative for congestion and tinnitus.   Eyes: Negative for blurred vision and double vision.  Respiratory: Positive for shortness of breath (improved). Negative for cough and wheezing.   Cardiovascular: Negative for chest pain, orthopnea and PND.  Gastrointestinal: Negative for nausea, vomiting, abdominal pain and diarrhea.  Genitourinary: Negative for dysuria and hematuria.  Neurological: Negative for dizziness, sensory change and focal weakness.  All other systems reviewed and are negative.   Nutrition: Heart healthy Tolerating Diet: Yes Tolerating PT: Ambulatory   DRUG ALLERGIES:   Allergies  Allergen Reactions  . Lidocaine Other (See Comments)    Reaction:  Hypotension   . Penicillins Other (See Comments)    Reaction:  Swollen feet     VITALS:  Blood pressure 115/48, pulse 63, temperature 97.8 F (36.6 C), temperature source Oral, resp. rate 17, height 6' (1.829 m), weight 79.788 kg (175 lb 14.4 oz), SpO2 100 %.  PHYSICAL EXAMINATION:   Physical Exam  GENERAL:  81 y.o.-year-old patient sitting up in chair in no acute distress.  EYES: Pupils equal, round, reactive to light and accommodation. No scleral icterus. Extraocular muscles intact.  HEENT: Head atraumatic, normocephalic. Oropharynx and nasopharynx clear.  NECK:  Supple, no jugular venous distention. No thyroid enlargement, no tenderness.  LUNGS: Normal breath sounds bilaterally, no wheezing, rhonchi. weak breath sound on the right base. No use of accessory muscles of respiration.  CARDIOVASCULAR: S1, S2 normal. 2/6 diastolic murmur heard at the left sternal border, no rubs, or gallops.  ABDOMEN:  Soft, nontender, nondistended. Bowel sounds present. No organomegaly or mass.  EXTREMITIES: No cyanosis, clubbing or edema b/l.    NEUROLOGIC: Cranial nerves II through XII are intact. No focal Motor or sensory deficits b/l.   PSYCHIATRIC: The patient is alert and oriented x 3. Good affect SKIN: No obvious rash, lesion, or ulcer.    LABORATORY PANEL:   CBC  Recent Labs Lab 04/21/15 0413  WBC 3.5*  HGB 10.1*  HCT 30.1*  PLT 51*   ------------------------------------------------------------------------------------------------------------------  Chemistries   Recent Labs Lab 04/19/15 1507  04/21/15 0413  NA 139  < > 143  K 4.4  < > 3.5  CL 98*  < > 97*  CO2 33*  < > 34*  GLUCOSE 133*  < > 103*  BUN 51*  < > 50*  CREATININE 1.80*  < > 1.72*  CALCIUM 9.1  < > 8.9  MG  --   < > 1.6*  AST 27  --   --   ALT 13*  --   --   ALKPHOS 87  --   --   BILITOT 1.2  --   --   < > = values in this interval not displayed. ------------------------------------------------------------------------------------------------------------------  Cardiac Enzymes No results for input(s): TROPONINI in the last 168 hours. ------------------------------------------------------------------------------------------------------------------  RADIOLOGY:  Dg Chest 2 View  04/20/2015   CLINICAL DATA:  Shortness of breath.  EXAM: CHEST  2 VIEW  COMPARISON:  04/16/2015  FINDINGS: Left pacer remains in place, unchanged. Prior median sternotomy. Moderate right pleural effusion with right lower lobe atelectasis or infiltrate. Suspect small left pleural effusion. No confluent opacity  on the left. Heart is enlarged. Aortic calcifications. No acute bony abnormality.  IMPRESSION: Moderate right pleural effusion with right lower lobe atelectasis or infiltrate, similar to prior study.  Cardiomegaly.  Small left pleural effusion.  No overall change since prior study.   Electronically Signed   By: Rolm Baptise M.D.   On:  04/20/2015 12:04     ASSESSMENT AND PLAN:   81 year old male with past medical history of cardiomyopathy ischemic, chronic systolic CHF, chronic atrial fibrillation, GERD, chronic kidney disease stage III, diabetes, glaucoma, history of bladder cancer, history of obstructive sleep apnea who presented to the hospital due to shortness of breath and noted to have a right-sided pleural effusion.  #1 CHF-acute on chronic systolic dysfunction. Patient was not responding to aggressive oral diuretics as an outpatient and therefore admitted and started on IV Lasix drip. -Patient has responded well to the Lasix drip and clinically feels better -Continue to follow I's and O's and daily weights and renal function closely. -Continue Coreg. Patient not on ACE inhibitor due to chronic kidney disease.  #2 right-sided pleural effusion-this was noticed on the chest x-ray done as an outpatient. -Patient has responded to Lasix well, repeated chest x-ray yesterday: no change. Repeat CXR tomorrow per Dr. Candiss Norse. If needed consider therapeutic/diagnostic thoracentesis.  #3 chronic kidney disease stage III-appreciate nephrology input. Renal function closely baseline and will continue to follow as patient is on aggressive diuresis.  #4 diabetes type 2 with renal complication-continue sliding scale insulin.  #5 hypertension-continue Coreg  #6 history of gout-continue allopurinol.  #7 depression-continue Zoloft.  #8 BPH-continue Flomax.  #9 hyperlipidemia-continue simvastatin.   * Hypomagnesemia. We will give magnesium supplement and a follow-up level.  * Chronic A. fib. On heparin drip, hold Coumadin for possible thoracentesis. INR is a 2.19 today.  * Thrombocytopenia. Follow-up CBC.   All the records are reviewed and case discussed with Care Management/Social Workerr. Management plans discussed with the patient, family and they are in agreement.  Greater than 50% of time spent in coordination of  care. Discussed with Dr. Candiss Norse.  CODE STATUS: DO NOT RESUSCITATE  DVT Prophylaxis: Heparin drip  TOTAL TIME TAKING CARE OF THIS PATIENT: 38 minutes.   POSSIBLE D/C IN 2 DAYS, DEPENDING ON CLINICAL CONDITION.   Demetrios Loll M.D on 04/21/2015 at 3:38 PM  Between 7am to 6pm - Pager - [phone]  After 6pm go to [email or web address] - password [email or web address] [facility]  [facility]  Office  [phone]  CC: Primary care physician; [name], MD

## 2015-04-21 NOTE — Evaluation (Signed)
Physical Therapy Evaluation Patient Details Name: [name] MRN: [id number] DOB: July 09, 1920 Today's Date: 04/21/2015   History of Present Illness  Patient is a 81 y/o male that presents with 1-2 weeks of peripheral edema and shortness of breath. Of note he has had 2 significant falls in the past year one fracturing his left arm, the other fracturing his left hip.   Clinical Impression  Patient is a very pleasant 81 y/o male that presents with shortness of breath, he is typically on O2 at home. He demonstrates mild decrease in mobility status, and desaturates with activity consistently to 82-88% with 2L of O2. Per his son this is typical, and patient recovers quickly <30 seconds, to baseline levels of 95% on 2-3 L of O2. Patient displays mild LE strength and power deficits in 5x sit to stand training and deconditioning with ambulation and would benefit from HHPT, given his history of falls and current deconditioning. Patient appears at or near his baseline mobility during this session. Skilled acute PT services are indicated at this time to address the above deficits.     Follow Up Recommendations Home health PT    Equipment Recommendations       Recommendations for Other Services       Precautions / Restrictions Precautions Precautions: Fall Restrictions Weight Bearing Restrictions: No      Mobility  Bed Mobility Overal bed mobility: Modified Independent             General bed mobility comments: Pt uses bedrails appropriately and does not require any cuing or assistance to transfer to the left side of the bed.   Transfers Overall transfer level: Needs assistance Equipment used: 4-wheeled walker Transfers: Sit to/from Stand Sit to Stand: Supervision         General transfer comment: Patient is able to transfer with no physical assistance or cuing, mild supervision required for balance and safety as patient is somewhat impulsive.   Ambulation/Gait Ambulation/Gait  assistance: Supervision Ambulation Distance (Feet): 200 Feet (Additional 200 for gait training/O2 saturation observation) Assistive device: 4-wheeled walker     Gait velocity interpretation: at or above normal speed for age/gender General Gait Details: Patient ambulates with neutral to IR on LLE and ER on RLE, he states this is his baseline and has been so since his fracture. No falls or loss of balance during ambulation, he demonstrates step through pattern. Patient is impulsive and does take his hands off his rollator frequently to gesture,.   Stairs            Wheelchair Mobility    Modified Rankin (Stroke Patients Only)       Balance Overall balance assessment: Needs assistance   Sitting balance-Leahy Scale: Good Sitting balance - Comments: No balance deficits noted in unsupported sitting.      Standing balance-Leahy Scale: Fair Standing balance comment: 5x sit to stand with use of UEs and patient did not come to full standing ~ 22 seconds, modified DGI 9/12                              Pertinent Vitals/Pain Pain Assessment: No/denies pain    Home Living Family/patient expects to be discharged to:: Private residence Living Arrangements: Children (Son 24/7) Available Help at Discharge: Family Type of Home: Independent living facility Home Access: Level entry     Home Layout: One level Home Equipment: Environmental consultant - 4 wheels;Shower seat  Prior Function Level of Independence: Independent with assistive device(s)         Comments: Patient ambulates short distances in and around the house with his son nearby. They go to the patio frequently.      Hand Dominance        Extremity/Trunk Assessment   Upper Extremity Assessment: Overall WFL for tasks assessed           Lower Extremity Assessment: Overall WFL for tasks assessed      Cervical / Trunk Assessment: Kyphotic  Communication   Communication: No difficulties  Cognition  Arousal/Alertness: Awake/alert Behavior During Therapy: WFL for tasks assessed/performed Overall Cognitive Status: Within Functional Limits for tasks assessed                      General Comments General comments (skin integrity, edema, etc.): Patient noted to have a "bump" over his right elbow, per patient and son this has been chronic since admission in the spring.     Exercises        Assessment/Plan    PT Assessment Patient needs continued PT services  PT Diagnosis Difficulty walking   PT Problem List Decreased strength;Decreased mobility;Cardiopulmonary status limiting activity;Decreased balance  PT Treatment Interventions Gait training;Neuromuscular re-education;Balance training;Therapeutic exercise;Therapeutic activities   PT Goals (Current goals can be found in the Care Plan section) Acute Rehab PT Goals Patient Stated Goal: To return home safely  PT Goal Formulation: With patient/family Time For Goal Achievement: 05/05/15 Potential to Achieve Goals: Good    Frequency Min 2X/week   Barriers to discharge        Co-evaluation               End of Session Equipment Utilized During Treatment: Gait belt;Oxygen Activity Tolerance: Patient tolerated treatment well Patient left: in chair;with call bell/phone within reach;with family/visitor present;with chair alarm set Nurse Communication: Mobility status         Time: 4193-7902 PT Time Calculation (min) (ACUTE ONLY): 27 min   Charges:   PT Evaluation $Initial PT Evaluation Tier I: 1 Procedure PT Treatments $Gait Training: 8-22 mins   PT G Codes:       Kerman Passey, PT, DPT    04/21/2015, 4:01 PM

## 2015-04-21 NOTE — Progress Notes (Signed)
Subjective:   Feels a little better overall Coumadin is held in case thoracentesis is necessary Breathing is better Was started on lasix drip with good response. UOP 2100 cc   Objective:  Vital signs in last 24 hours:  Temp:  [98 F (36.7 C)-98.2 F (36.8 C)] 98 F (36.7 C) (09/14 0546) Pulse Rate:  [59-61] 59 (09/14 0546) Resp:  [20-22] 20 (09/14 0546) BP: (110-146)/(45-59) 110/45 mmHg (09/14 0546) SpO2:  [97 %-100 %] 97 % (09/14 0546) Weight:  [79.788 kg (175 lb 14.4 oz)] 79.788 kg (175 lb 14.4 oz) (09/14 0446)  Weight change: -4.355 kg (-9 lb 9.6 oz) Filed Weights   04/19/15 1430 04/21/15 0446  Weight: 84.142 kg (185 lb 8 oz) 79.788 kg (175 lb 14.4 oz)    Intake/Output:    Intake/Output Summary (Last 24 hours) at 04/21/15 1112 Last data filed at 04/21/15 1058  Gross per 24 hour  Intake 712.75 ml  Output   1690 ml  Net -977.25 ml     Physical Exam: General: NAD, laying in bed  HEENT Moist mucus membranes  Neck supple  Pulm/lungs mild b/l crackles, normal effort, O2 by [place], rt decreased breath sounds  CVS/Heart Irregular rhythm  Abdomen:  Soft, nin tender  Extremities: ++ dependent edema  Neurologic: Alert, able to follow commands  Skin: No acute rashes          Basic Metabolic Panel:   Recent Labs Lab 04/19/15 1507 04/20/15 0201 04/20/15 0443 04/21/15 0413  NA 139  --  141 143  K 4.4  --  3.9 3.5  CL 98*  --  99* 97*  CO2 33*  --  33* 34*  GLUCOSE 133*  --  109* 103*  BUN 51*  --  51* 50*  CREATININE 1.80*  --  1.70* 1.72*  CALCIUM 9.1  --  9.2 8.9  MG  --  1.7  --  1.6*  PHOS  --   --  3.3  --      CBC:  Recent Labs Lab 04/19/15 1507 04/20/15 0201 04/21/15 0413  WBC 4.1 3.8 3.5*  HGB 10.4* 10.2* 10.1*  HCT 31.8* 30.6* 30.1*  MCV 101.9* 100.8* 101.8*  PLT 64* 43* 51*      Microbiology:  No results found for this or any previous visit (from the past 720 hour(s)).  Coagulation Studies:  Recent Labs  04/19/15 1507  04/21/15 0413  LABPROT 25.7* 24.5*  INR 2.33 2.19    Urinalysis:  Recent Labs  04/19/15 1625  COLORURINE YELLOW*  LABSPEC 1.010  PHURINE 5.0  GLUCOSEU NEGATIVE  HGBUR NEGATIVE  BILIRUBINUR NEGATIVE  KETONESUR NEGATIVE  PROTEINUR NEGATIVE  NITRITE NEGATIVE  LEUKOCYTESUR 2+*      Imaging: Dg Chest 2 View  04/20/2015   CLINICAL DATA:  Shortness of breath.  EXAM: CHEST  2 VIEW  COMPARISON:  04/16/2015  FINDINGS: Left pacer remains in place, unchanged. Prior median sternotomy. Moderate right pleural effusion with right lower lobe atelectasis or infiltrate. Suspect small left pleural effusion. No confluent opacity on the left. Heart is enlarged. Aortic calcifications. No acute bony abnormality.  IMPRESSION: Moderate right pleural effusion with right lower lobe atelectasis or infiltrate, similar to prior study.  Cardiomegaly.  Small left pleural effusion.  No overall change since prior study.   Electronically Signed   By: Rolm Baptise M.D.   On: 04/20/2015 12:04     Medications:   . furosemide (LASIX) infusion 8 mg/hr (04/20/15 1900)  . heparin  1,300 Units/hr (04/20/15 1409)   . carvedilol  12.5 mg Oral BID  . cholecalciferol  1,000 Units Oral QODAY  . docusate sodium  100 mg Oral BID  . feeding supplement (GLUCERNA SHAKE)  237 mL Oral q12n4p  . ferrous sulfate  325 mg Oral Daily  . insulin aspart  0-5 Units Subcutaneous QHS  . insulin aspart  0-9 Units Subcutaneous TID WC  . pantoprazole  40 mg Oral Daily  . potassium chloride SA  20 mEq Oral Daily  . sertraline  50 mg Oral QHS  . simvastatin  40 mg Oral QHS  . sodium chloride  3 mL Intravenous Q12H  . vitamin B-12  1,000 mcg Oral Daily   sodium chloride, acetaminophen **OR** acetaminophen, albuterol, allopurinol, ondansetron **OR** ondansetron (ZOFRAN) IV, polyethylene glycol, sodium chloride  Assessment/ Plan:  81 y.o. male with multiple complex medical issues including Pacemaker placed December 03, 2008 after syncope  from sick sinus syndrome. Anemia of chronic kidney disease , intermittent atrial fibrillation - chronic Anticoagulation, Ch systolic congestive heart failure, Hypertension, , Chronic kidney disease, Diabetes, Gout, History of bladder cancer 1988 - operation. Follows Dr. Rogers Blocker, GERD, Glaucoma Colonoscopy in 2003,  Aortic heart valve replacement Jan 09, Heart Cath jan 2010, surgery for neoplasm on the scalp was done at [facility]. Admission [facility] 06/09/09-06/11/09 for proteus bactermia and ARF., Right knee replacement June 28, 2009 - redo after infected prosthesis removal, left hip fracture - surgical repair by Dr. Marry Guan, Left arm fracture   1. chronic kidney disease stage 3. GFR 31. S Cr close to baseline 2. SOB from combination of acute on chronic sys CHF, Aortic stenosis, Pulmonary hypertension/ severe TR and  -  continue lasix;  salt restriction.  -  Lasix given as continuous iv infusion -  labs and electrolytes daily - decrease dose of iv lasix infusion - New EDW might be less than 175 lbs 3. Rt pleural effusion - new - consider pleural tap if not improved symptomatically - cardiology evaluation - CXR follow up 4. Peripheral edema - see above    LOS: 2 [name] 9/14/201611:12 AM

## 2015-04-21 NOTE — Care Management (Signed)
Patient remains on heparin drip and lasix drip.  It is verbally reported that patient may not require thoracentesis.  Physical therapy made no recommendations for home physical therapy follow up.  Patient denies the need for any home health nursing follow up.  He says that his son lives with him and "everything is just fine."  He is agreeable to consider if attending feels it is definitely necessary.

## 2015-04-22 ENCOUNTER — Inpatient Hospital Stay: Payer: Medicare PPO

## 2015-04-22 LAB — CBC
HCT: 29.3 % — ABNORMAL LOW (ref 40.0–52.0)
Hemoglobin: 9.7 g/dL — ABNORMAL LOW (ref 13.0–18.0)
MCH: 33.8 pg (ref 26.0–34.0)
MCHC: 33.2 g/dL (ref 32.0–36.0)
MCV: 101.9 fL — AB (ref 80.0–100.0)
PLATELETS: 42 10*3/uL — AB (ref 150–440)
RBC: 2.88 MIL/uL — AB (ref 4.40–5.90)
RDW: 14.4 % (ref 11.5–14.5)
WBC: 3.8 10*3/uL (ref 3.8–10.6)

## 2015-04-22 LAB — BASIC METABOLIC PANEL
Anion gap: 11 (ref 5–15)
BUN: 51 mg/dL — AB (ref 6–20)
CHLORIDE: 94 mmol/L — AB (ref 101–111)
CO2: 36 mmol/L — ABNORMAL HIGH (ref 22–32)
CREATININE: 1.74 mg/dL — AB (ref 0.61–1.24)
Calcium: 9.1 mg/dL (ref 8.9–10.3)
GFR, EST AFRICAN AMERICAN: 37 mL/min — AB (ref >60)
GFR, EST NON AFRICAN AMERICAN: 32 mL/min — AB (ref >60)
Glucose, Bld: 106 mg/dL — ABNORMAL HIGH (ref 65–99)
Potassium: 3.4 mmol/L — ABNORMAL LOW (ref 3.5–5.1)
SODIUM: 141 mmol/L (ref 135–145)

## 2015-04-22 LAB — PROTIME-INR
INR: 2.28
PROTHROMBIN TIME: 25.3 s — AB (ref 11.4–15.0)

## 2015-04-22 LAB — HEPARIN LEVEL (UNFRACTIONATED): HEPARIN UNFRACTIONATED: 0.78 [IU]/mL — AB (ref 0.30–0.70)

## 2015-04-22 LAB — GLUCOSE, CAPILLARY: GLUCOSE-CAPILLARY: 108 mg/dL — AB (ref 65–99)

## 2015-04-22 LAB — MAGNESIUM: MAGNESIUM: 1.8 mg/dL (ref 1.7–2.4)

## 2015-04-22 MED ORDER — FUROSEMIDE 40 MG PO TABS: 40.0000 mg | ORAL_TABLET | Freq: Two times a day (BID) | ORAL | Status: DC

## 2015-04-22 NOTE — Progress Notes (Signed)
2 L of oxygen. Paced. FS are stable. Up to chair and tolerated it well. Family at the bedside. Family at the bedside. Takes meds ok.Urinal. Prescription given to pt. Discharge instructions given to pt. Portable oxygen tank was used to d/c pt. Iv and tele removed. Discharge instructions given to pt. Pt has no further concerns at this time.

## 2015-04-22 NOTE — Progress Notes (Signed)
Subjective:   Feels better overall, walked with physical therapy yesterday Coumadin is held in case thoracentesis is necessary Breathing is better Good response to Lasix drip   Objective:  Vital signs in last 24 hours:  Temp:  [97.7 F (36.5 C)-98.6 F (37 C)] 97.7 F (36.5 C) (09/15 0536) Pulse Rate:  [60-77] 60 (09/15 0536) Resp:  [17-20] 20 (09/15 0536) BP: (115-147)/(48-74) 116/62 mmHg (09/15 0536) SpO2:  [100 %] 100 % (09/15 0536) Weight:  [79.243 kg (174 lb 11.2 oz)] 79.243 kg (174 lb 11.2 oz) (09/15 1022)  Weight change:  Filed Weights   04/19/15 1430 04/21/15 0446 04/22/15 1022  Weight: 84.142 kg (185 lb 8 oz) 79.788 kg (175 lb 14.4 oz) 79.243 kg (174 lb 11.2 oz)    Intake/Output:    Intake/Output Summary (Last 24 hours) at 04/22/15 1107 Last data filed at 04/22/15 1037  Gross per 24 hour  Intake      0 ml  Output   1352 ml  Net  -1352 ml     Physical Exam: General: NAD, laying in bed  HEENT Moist mucus membranes  Neck supple  Pulm/lungs mild b/l crackles, normal effort, O2 by [place], rt decreased breath sounds  CVS/Heart Irregular rhythm  Abdomen:  Soft, nin tender  Extremities: + dependent edema  Neurologic: Alert, able to follow commands  Skin: No acute rashes          Basic Metabolic Panel:   Recent Labs Lab 04/19/15 1507 04/20/15 0201 04/20/15 0443 04/21/15 0413 04/22/15 0442  NA 139  --  141 143 141  K 4.4  --  3.9 3.5 3.4*  CL 98*  --  99* 97* 94*  CO2 33*  --  33* 34* 36*  GLUCOSE 133*  --  109* 103* 106*  BUN 51*  --  51* 50* 51*  CREATININE 1.80*  --  1.70* 1.72* 1.74*  CALCIUM 9.1  --  9.2 8.9 9.1  MG  --  1.7  --  1.6* 1.8  PHOS  --   --  3.3  --   --      CBC:  Recent Labs Lab 04/19/15 1507 04/20/15 0201 04/21/15 0413 04/22/15 0442  WBC 4.1 3.8 3.5* 3.8  HGB 10.4* 10.2* 10.1* 9.7*  HCT 31.8* 30.6* 30.1* 29.3*  MCV 101.9* 100.8* 101.8* 101.9*  PLT 64* 43* 51* 42*      Microbiology:  No results found for  this or any previous visit (from the past 720 hour(s)).  Coagulation Studies:  Recent Labs  04/19/15 1507 04/21/15 0413 04/22/15 0442  LABPROT 25.7* 24.5* 25.3*  INR 2.33 2.19 2.28    Urinalysis:  Recent Labs  04/19/15 1625  COLORURINE YELLOW*  LABSPEC 1.010  PHURINE 5.0  GLUCOSEU NEGATIVE  HGBUR NEGATIVE  BILIRUBINUR NEGATIVE  KETONESUR NEGATIVE  PROTEINUR NEGATIVE  NITRITE NEGATIVE  LEUKOCYTESUR 2+*      Imaging: Dg Chest 2 View  04/22/2015   CLINICAL DATA:  Pleural effusion. Aortic valve replacement.  EXAM: CHEST  2 VIEW  COMPARISON:  04/20/2015  FINDINGS: Mild cardiac enlargement. Stable aortic valve replacement and cardiac pacer device. Stable moderate diffuse interstitial opacification. Moderate right pleural effusion with underlying consolidation at the right base. When compared to the prior study this does not show significant interval change.  IMPRESSION: No significant change from prior study with diffuse interstitial prominence possibly representing edema, as well as moderate right pleural effusion and right lower lobe consolidation.   Electronically Signed  By: Skipper Cliche M.D.   On: 04/22/2015 07:28   Dg Chest 2 View  04/20/2015   CLINICAL DATA:  Shortness of breath.  EXAM: CHEST  2 VIEW  COMPARISON:  04/16/2015  FINDINGS: Left pacer remains in place, unchanged. Prior median sternotomy. Moderate right pleural effusion with right lower lobe atelectasis or infiltrate. Suspect small left pleural effusion. No confluent opacity on the left. Heart is enlarged. Aortic calcifications. No acute bony abnormality.  IMPRESSION: Moderate right pleural effusion with right lower lobe atelectasis or infiltrate, similar to prior study.  Cardiomegaly.  Small left pleural effusion.  No overall change since prior study.   Electronically Signed   By: Rolm Baptise M.D.   On: 04/20/2015 12:04     Medications:   . furosemide (LASIX) infusion 4 mg/hr (04/21/15 1115)  . heparin  1,200 Units/hr (04/22/15 0615)   . carvedilol  12.5 mg Oral BID  . cholecalciferol  1,000 Units Oral QODAY  . docusate sodium  100 mg Oral BID  . feeding supplement (GLUCERNA SHAKE)  237 mL Oral q12n4p  . ferrous sulfate  325 mg Oral Daily  . insulin aspart  0-5 Units Subcutaneous QHS  . insulin aspart  0-9 Units Subcutaneous TID WC  . pantoprazole  40 mg Oral Daily  . potassium chloride SA  20 mEq Oral Daily  . sertraline  50 mg Oral QHS  . simvastatin  40 mg Oral QHS  . vitamin B-12  1,000 mcg Oral Daily   acetaminophen **OR** acetaminophen, albuterol, allopurinol, ondansetron **OR** ondansetron (ZOFRAN) IV, polyethylene glycol, sodium chloride  Assessment/ Plan:  81 y.o. male with multiple complex medical issues including Pacemaker placed December 03, 2008 after syncope from sick sinus syndrome. Anemia of chronic kidney disease , intermittent atrial fibrillation - chronic Anticoagulation, Ch systolic congestive heart failure, Hypertension, , Chronic kidney disease, Diabetes, Gout, History of bladder cancer 1988 - operation. Follows Dr. Rogers Blocker, GERD, Glaucoma Colonoscopy in 2003,  Aortic heart valve replacement Jan 09, Heart Cath jan 2010, surgery for neoplasm on the scalp was done at [facility]. Admission [facility] 06/09/09-06/11/09 for proteus bactermia and ARF., Right knee replacement June 28, 2009 - redo after infected prosthesis removal, left hip fracture - surgical repair by Dr. Marry Guan, Left arm fracture   1. chronic kidney disease stage 3. GFR 31. S Cr close to baseline 2. SOB from combination of acute on chronic sys CHF, Aortic stenosis, Pulmonary hypertension/ severe TR and  -  continue lasix;  salt restriction.  -  Lasix given as continuous iv infusion -  labs and electrolytes daily - decrease dose of iv lasix infusion - New EDW = 174 lbs 3. Rt pleural effusion -defer pleural tap since patient has improved clinically -with thrombocytopenia and anticoagulation, he is at risk of  bleeding  4. Peripheral edema - see above    LOS: 3 [name] 9/15/201611:07 AM

## 2015-04-22 NOTE — Discharge Instructions (Signed)
Low sodium, low fat and ADA diet. °Activity as tolerated. °

## 2015-04-22 NOTE — Care Management (Signed)
There is a discharge order present.  Even though physical therapy documented patient would benefit from home health physical therapy and would benefit from home health nursing, patient is declining need for the service.  Left voicemail message for Lawerance Sabal at [facility]

## 2015-04-22 NOTE — Progress Notes (Signed)
ANTICOAGULATION CONSULT NOTE - Initial Consult  Pharmacy Consult for Heparin Indication: atrial fibrillation  Allergies  Allergen Reactions  . Lidocaine Other (See Comments)    Reaction:  Hypotension   . Penicillins Other (See Comments)    Reaction:  Swollen feet     Patient Measurements: Height: 6' (182.9 cm) Weight: 175 lb 14.4 oz (79.788 kg) IBW/kg (Calculated) : 77.6 Heparin Dosing Weight: 84.1 kg  Vital Signs: Temp: 97.7 F (36.5 C) (09/15 0536) Temp Source: Oral (09/15 0536) BP: 116/62 mmHg (09/15 0536) Pulse Rate: 60 (09/15 0536)  Labs:  Recent Labs  04/19/15 1507 04/19/15 1722  04/20/15 0201 04/20/15 0443 04/20/15 0951 04/21/15 0413 04/22/15 0442  HGB 10.4*  --   --  10.2*  --   --  10.1* 9.7*  HCT 31.8*  --   --  30.6*  --   --  30.1* 29.3*  PLT 64*  --   --  43*  --   --  51* 42*  APTT  --  39*  --   --   --   --   --   --   LABPROT 25.7*  --   --   --   --   --  24.5*  --   INR 2.33  --   --   --   --   --  2.19  --   HEPARINUNFRC  --   --   < > 0.48  --  0.50 0.53 0.78*  CREATININE 1.80*  --   --   --  1.70*  --  1.72* 1.74*  < > = values in this interval not displayed.  Estimated Creatinine Clearance: 29.1 mL/min (by C-G formula based on Cr of 1.74).   Medical History: Past Medical History  Diagnosis Date  . Arthritis   . Bladder cancer   . Diabetes   . Glaucoma   . Hypertension   . Sleep apnea   . Atrial fibrillation   . CHF (congestive heart failure)   . Atrial fibrillation   . Anemia   . Esophageal reflux   . Dyslipidemia   . LVF (left ventricular failure)   . CAD (coronary artery disease)   . Chronic kidney disease     Medications:  Prescriptions prior to admission  Medication Sig Dispense Refill Last Dose  . allopurinol (ZYLOPRIM) 100 MG tablet Take 100 mg by mouth daily as needed (for gout flares).   PRN at PRN  . carvedilol (COREG) 12.5 MG tablet Take 12.5 mg by mouth 2 (two) times daily.   unknown at unknown  .  cholecalciferol (VITAMIN D) 1000 UNITS tablet Take 1,000 Units by mouth every other day.   unknown at unknown  . ferrous sulfate 325 (65 FE) MG tablet Take 325 mg by mouth daily.   unknown at unknown  . furosemide (LASIX) 40 MG tablet Take 40 mg by mouth every evening.   unknown at unknown  . metolazone (ZAROXOLYN) 2.5 MG tablet Take 2.5 mg by mouth daily as needed (for edema).   PRN at PRN  . omeprazole (PRILOSEC) 20 MG capsule Take 20 mg by mouth daily.   unknown at unknown  . oxyCODONE-acetaminophen (PERCOCET/ROXICET) 5-325 MG per tablet Take 1 tablet by mouth every 4 (four) hours as needed for severe pain.   PRN at PRN  . potassium chloride SA (K-DUR,KLOR-CON) 20 MEQ tablet Take 20 mEq by mouth daily.   unknown at unknown  . sertraline (ZOLOFT) 50 MG tablet  Take 50 mg by mouth at bedtime.   unknown at unknown  . simvastatin (ZOCOR) 40 MG tablet Take 40 mg by mouth at bedtime.   unknown at unknown  . sitaGLIPtin (JANUVIA) 50 MG tablet Take 50 mg by mouth at bedtime.   unknown at unknown  . vitamin B-12 (CYANOCOBALAMIN) 1000 MCG tablet Take 1,000 mcg by mouth daily.   unknown at unknown  . warfarin (COUMADIN) 2.5 MG tablet Take 2.5 mg by mouth daily. Pt takes on Monday, Wednesday, Friday, and Saturday.   unknown at unknown  . warfarin (COUMADIN) 5 MG tablet Take 5 mg by mouth daily. Pt takes on Tuesday, Thursday, and Sunday.   unknown at unknown    Assessment: Pharmacy consulted to dose heparin in this 81 yr old male with Afib, on warfarin PTA. Started on heparin in anticipation of possible thoracentesis.  CrCl = 28.1 ml/min  Goal of Therapy:  Heparin level 0.3-0.7 units/ml Monitor platelets by anticoagulation protocol: Yes   Plan:  Current orders for heparin 1300 units/hr. Repeat heparin level remains therapeutic. Will continue current rate, recheck heparin level and CBC with AM labs.  Platelet count: 43 this AM, from 64 on admission. Discussed with Dr. Verdell Carmine. Patient appears to have  chronic thrombocytopenia. Will continue heparin drip at this time, MD to order hematology consult.   0914 AM heparin level therapeutic, continue current rate. Will recheck with AM labs. Plt 51 this AM.   0915 0442 heparin level supratherapeutic. Decrease to 1200 units/hr and will recheck in 8 hours.   [name] A. Jordan Hawks, PharmD Clinical Pharmacist   04/22/2015,6:02 AM

## 2015-04-22 NOTE — Discharge Summary (Signed)
[facility] NAME: [name]    MR#:  [id number]  DATE OF BIRTH:  14-Nov-1919  DATE OF ADMISSION:  04/19/2015 ADMITTING PHYSICIAN: Demetrios Loll, MD  DATE OF DISCHARGE: 04/22/2015 11:40 AM  PRIMARY CARE PHYSICIAN: [name], MD    ADMISSION DIAGNOSIS:  acute chf   DISCHARGE DIAGNOSIS:   Right side pleural effusion Acute on chronic systolic CHF  SECONDARY DIAGNOSIS:   Past Medical History  Diagnosis Date  . Arthritis   . Bladder cancer   . Diabetes   . Glaucoma   . Hypertension   . Sleep apnea   . Atrial fibrillation   . CHF (congestive heart failure)   . Atrial fibrillation   . Anemia   . Esophageal reflux   . Dyslipidemia   . LVF (left ventricular failure)   . CAD (coronary artery disease)   . Chronic kidney disease     HOSPITAL COURSE:   #1 CHF-acute on chronic systolic dysfunction. Patient was not responding to aggressive oral diuretics as an outpatient and therefore admitted and started on IV Lasix drip. He has responded well to the Lasix drip and clinically feels better He is not on ACE inhibitor due to chronic kidney disease.  #2 right-sided pleural effusion-this was noticed on the chest x-ray done as an outpatient. The Patient has responded to Lasix well, repeated chest x-ray today show much improvement. Right-sided pleural effusion. Per Dr. Candiss Norse, the patient doesn't need thoracentesis. He needs to continue  Lasix 40 mg by mouth twice a day as outpatient.  #3 chronic kidney disease stage III. Renal function has been stable. #4 diabetes type 2 with renal complication-continue sliding scale insulin.  #5 hypertension has been controlled with Lasix and Coreg  * Chronic respiratory failure. Stable, on oxygen by nasal cannular 2 L. * Chronic A. fib. Coumadin was on hold for possible thoracentesis. He has been treated with heparin drip. Since patient is symptoms has much improved and chest x-ray showed  improvement of right-sided pleural effusion, he doesn't need thoracentesis. I discussed with Dr. Candiss Norse, he needs to resume Coumadin and follow-up INR as outpatient. Today's INR is at 2.28.  DISCHARGE CONDITIONS:   Stable, discharged to home today.  CONSULTS OBTAINED:  Treatment Team:  Murlean Iba, MD Lloyd Huger, MD Forest Gleason, MD  DRUG ALLERGIES:   Allergies  Allergen Reactions  . Lidocaine Other (See Comments)    Reaction:  Hypotension   . Penicillins Other (See Comments)    Reaction:  Swollen feet     DISCHARGE MEDICATIONS:   Discharge Medication List as of 04/22/2015 11:05 AM    CONTINUE these medications which have CHANGED   Details  furosemide (LASIX) 40 MG tablet Take 1 tablet (40 mg total) by mouth 2 (two) times daily., Starting 04/22/2015, Until Discontinued, Print      CONTINUE these medications which have NOT CHANGED   Details  allopurinol (ZYLOPRIM) 100 MG tablet Take 100 mg by mouth daily as needed (for gout flares)., Until Discontinued, Historical Med    carvedilol (COREG) 12.5 MG tablet Take 12.5 mg by mouth 2 (two) times daily., Until Discontinued, Historical Med    cholecalciferol (VITAMIN D) 1000 UNITS tablet Take 1,000 Units by mouth every other day., Until Discontinued, Historical Med    ferrous sulfate 325 (65 FE) MG tablet Take 325 mg by mouth daily., Until Discontinued, Historical Med    metolazone (ZAROXOLYN) 2.5 MG tablet Take 2.5 mg by  mouth daily as needed (for edema)., Until Discontinued, Historical Med    omeprazole (PRILOSEC) 20 MG capsule Take 20 mg by mouth daily., Until Discontinued, Historical Med    oxyCODONE-acetaminophen (PERCOCET/ROXICET) 5-325 MG per tablet Take 1 tablet by mouth every 4 (four) hours as needed for severe pain., Until Discontinued, Historical Med    potassium chloride SA (K-DUR,KLOR-CON) 20 MEQ tablet Take 20 mEq by mouth daily., Until Discontinued, Historical Med    sertraline (ZOLOFT) 50 MG tablet Take  50 mg by mouth at bedtime., Until Discontinued, Historical Med    simvastatin (ZOCOR) 40 MG tablet Take 40 mg by mouth at bedtime., Until Discontinued, Historical Med    sitaGLIPtin (JANUVIA) 50 MG tablet Take 50 mg by mouth at bedtime., Until Discontinued, Historical Med    vitamin B-12 (CYANOCOBALAMIN) 1000 MCG tablet Take 1,000 mcg by mouth daily., Until Discontinued, Historical Med    !! warfarin (COUMADIN) 2.5 MG tablet Take 2.5 mg by mouth daily. Pt takes on Monday, Wednesday, Friday, and Saturday., Until Discontinued, Historical Med    !! warfarin (COUMADIN) 5 MG tablet Take 5 mg by mouth daily. Pt takes on Tuesday, Thursday, and Sunday., Until Discontinued, Historical Med     !! - Potential duplicate medications found. Please discuss with provider.       DISCHARGE INSTRUCTIONS:     If you experience worsening of your admission symptoms, develop shortness of breath, life threatening emergency, suicidal or homicidal thoughts you must seek medical attention immediately by calling 911 or calling your MD immediately  if symptoms less severe.  You Must read complete instructions/literature along with all the possible adverse reactions/side effects for all the Medicines you take and that have been prescribed to you. Take any new Medicines after you have completely understood and accept all the possible adverse reactions/side effects.   Please note  You were cared for by a hospitalist during your hospital stay. If you have any questions about your discharge medications or the care you received while you were in the hospital after you are discharged, you can call the unit and asked to speak with the hospitalist on call if the hospitalist that took care of you is not available. Once you are discharged, your primary care physician will handle any further medical issues. Please note that NO REFILLS for any discharge medications will be authorized once you are discharged, as it is imperative  that you return to your primary care physician (or establish a relationship with a primary care physician if you do not have one) for your aftercare needs so that they can reassess your need for medications and monitor your lab values.    Today   SUBJECTIVE   No complaint.   VITAL SIGNS:  Blood pressure 116/62, pulse 60, temperature 97.7 F (36.5 C), temperature source Oral, resp. rate 20, height 6' (1.829 m), weight 79.243 kg (174 lb 11.2 oz), SpO2 100 %.  I/O:   Intake/Output Summary (Last 24 hours) at 04/22/15 1540 Last data filed at 04/22/15 1037  Gross per 24 hour  Intake      0 ml  Output   1101 ml  Net  -1101 ml    PHYSICAL EXAMINATION:  GENERAL:  81 y.o.-year-old patient lying in the bed with no acute distress.  EYES: Pupils equal, round, reactive to light and accommodation. No scleral icterus. Extraocular muscles intact.  HEENT: Head atraumatic, normocephalic. Oropharynx and nasopharynx clear.  NECK:  Supple, no jugular venous distention. No thyroid enlargement, no tenderness.  LUNGS: Mild wheeze 40 sounds on the right side, no wheezing, rales,rhonchi or crepitation. No use of accessory muscles of respiration.  CARDIOVASCULAR: S1, S2 normal. No murmurs, rubs, or gallops.  ABDOMEN: Soft, non-tender, non-distended. Bowel sounds present. No organomegaly or mass.  EXTREMITIES: No pedal edema, cyanosis, or clubbing.  NEUROLOGIC: Cranial nerves II through XII are intact. Muscle strength 5/5 in all extremities. Sensation intact. Gait not checked.  PSYCHIATRIC: The patient is alert and oriented x 3.  SKIN: No obvious rash, lesion, or ulcer.   DATA REVIEW:   CBC  Recent Labs Lab 04/22/15 0442  WBC 3.8  HGB 9.7*  HCT 29.3*  PLT 42*    Chemistries   Recent Labs Lab 04/19/15 1507  04/22/15 0442  NA 139  < > 141  K 4.4  < > 3.4*  CL 98*  < > 94*  CO2 33*  < > 36*  GLUCOSE 133*  < > 106*  BUN 51*  < > 51*  CREATININE 1.80*  < > 1.74*  CALCIUM 9.1  < > 9.1   MG  --   < > 1.8  AST 27  --   --   ALT 13*  --   --   ALKPHOS 87  --   --   BILITOT 1.2  --   --   < > = values in this interval not displayed.  Cardiac Enzymes No results for input(s): TROPONINI in the last 168 hours.  Microbiology Results  No results found for this or any previous visit.  RADIOLOGY:  Dg Chest 2 View  04/22/2015   CLINICAL DATA:  Pleural effusion. Aortic valve replacement.  EXAM: CHEST  2 VIEW  COMPARISON:  04/20/2015  FINDINGS: Mild cardiac enlargement. Stable aortic valve replacement and cardiac pacer device. Stable moderate diffuse interstitial opacification. Moderate right pleural effusion with underlying consolidation at the right base. When compared to the prior study this does not show significant interval change.  IMPRESSION: No significant change from prior study with diffuse interstitial prominence possibly representing edema, as well as moderate right pleural effusion and right lower lobe consolidation.   Electronically Signed   By: Skipper Cliche M.D.   On: 04/22/2015 07:28      I discussed with Dr. Candiss Norse and the patient's son.  Management plans discussed with the patient, family and they are in agreement.  CODE STATUS:  Advance Directive Documentation        Most Recent Value   Type of Advance Directive  Healthcare Power of Attorney   Pre-existing out of facility DNR order (yellow form or pink MOST form)     "MOST" Form in Place?        TOTAL TIME TAKING CARE OF THIS PATIENT: 37 minutes.    Demetrios Loll M.D on 04/22/2015 at 3:40 PM  Between 7am to 6pm - Pager - [phone]  After 6pm go to [email or web address] - password [email or web address]  [facility]  Office  [phone]  CC: Primary care physician; [name], MD

## 2015-05-31 IMAGING — US US RENAL KIDNEY
1 series · 14 of 25 positions shown · non-contrast
Comparison: None.

CLINICAL DATA: Urinary retention, acute renal failure.

EXAM:
RENAL/URINARY TRACT ULTRASOUND COMPLETE

[Series 1: us renal kidney · 0.29mm/px · 14 of 33 slices shown]
[im 1/33]
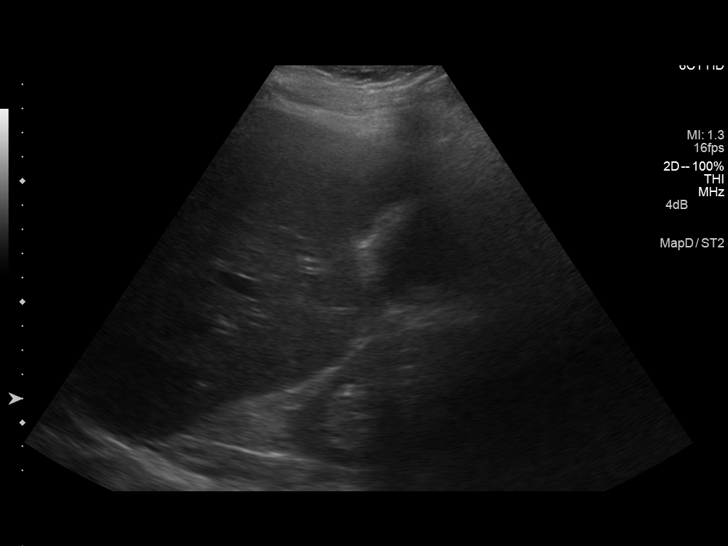
[im 3/33]
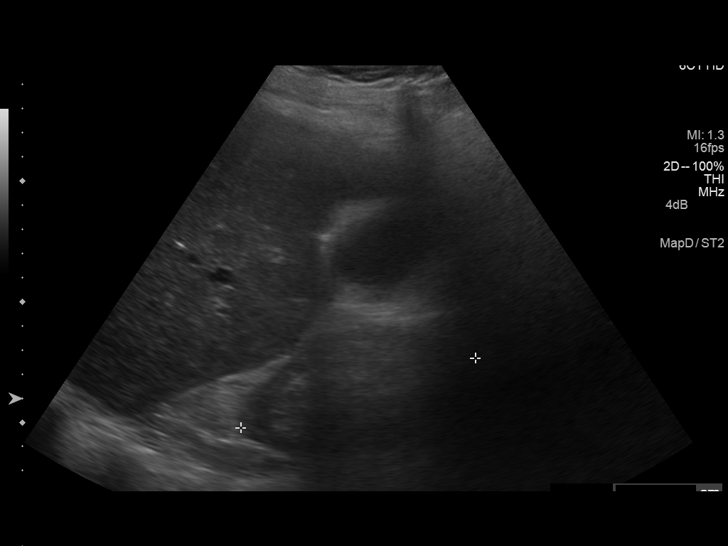
[im 6/33]
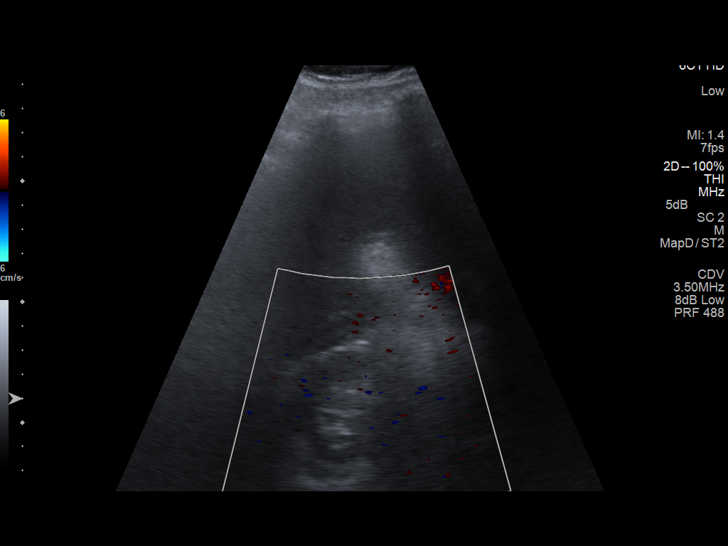
[im 9/33]
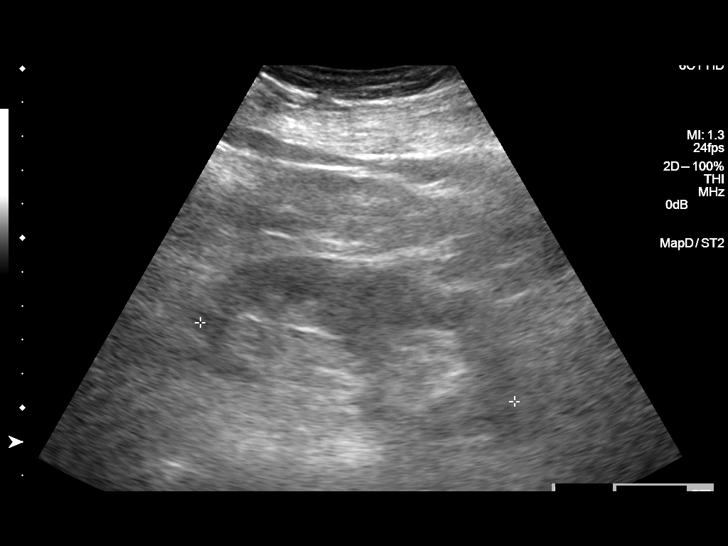
[im 11/33]
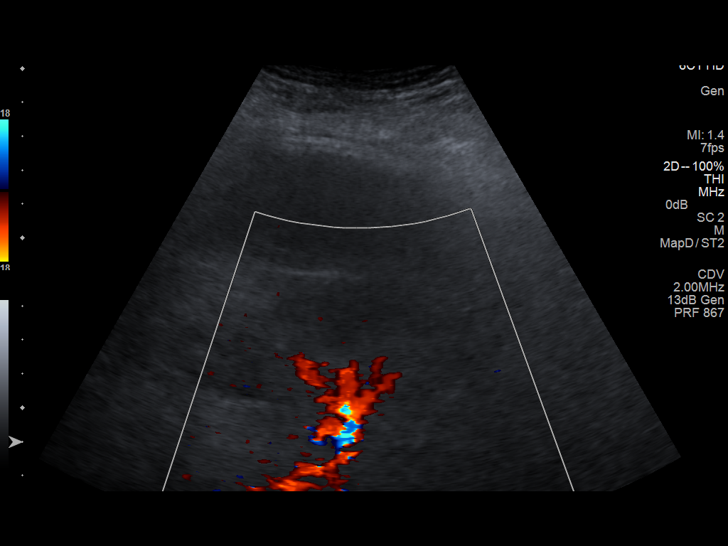
[im 13/33]
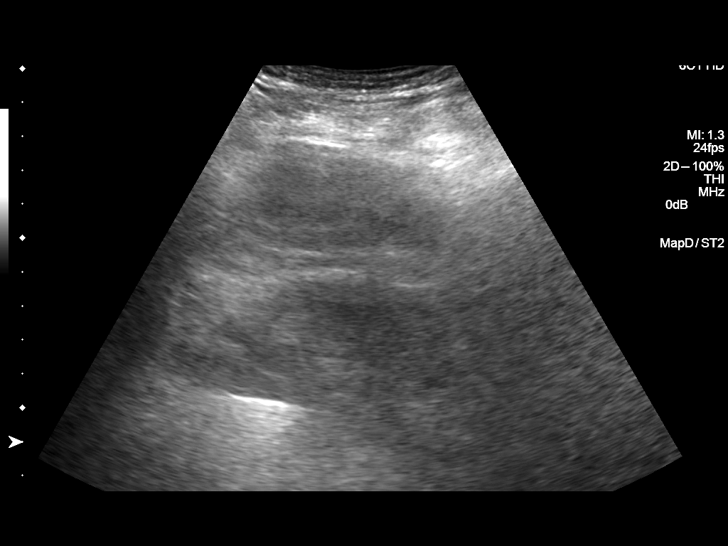
[im 15/33]
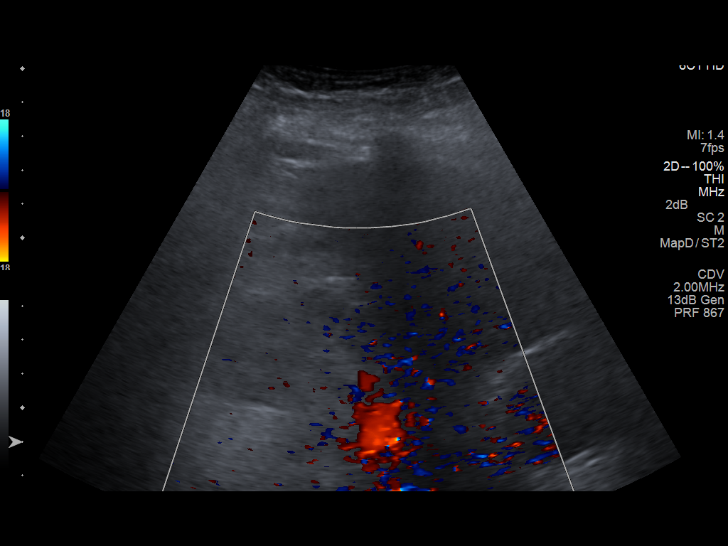
[im 18/33]
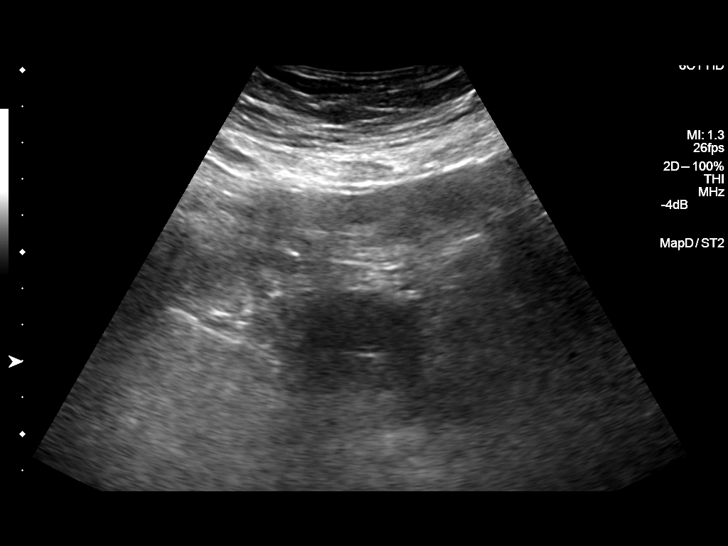
[im 21/33]
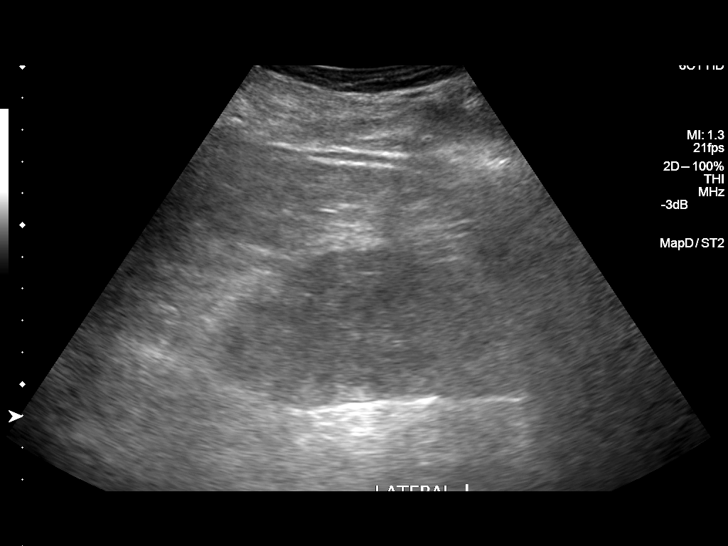
[im 22/33]
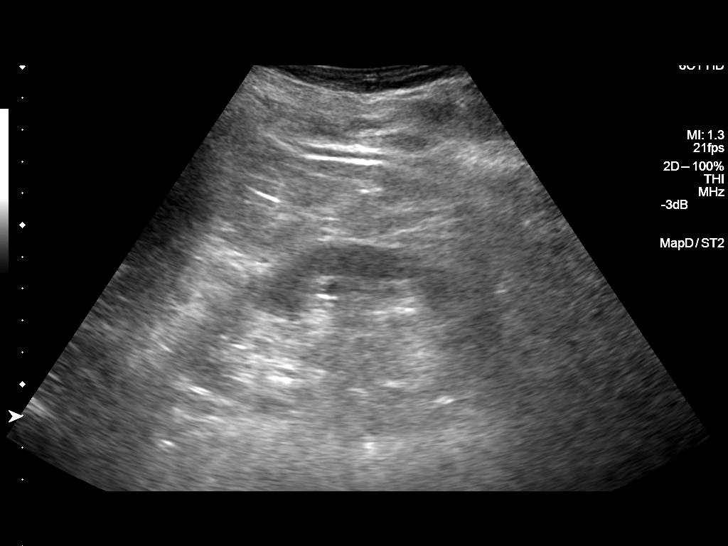
[im 25/33]
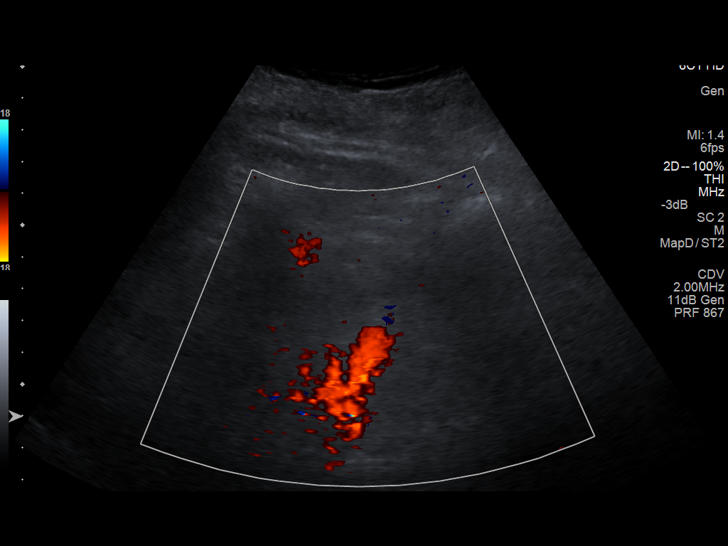
[im 27/33]
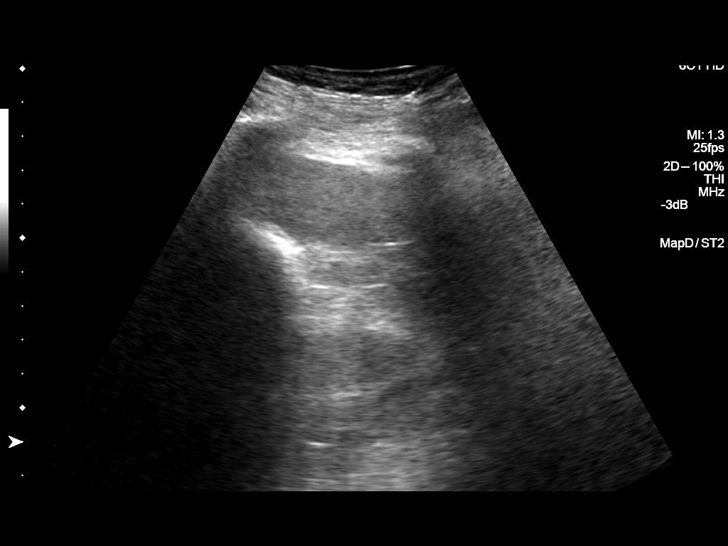
[im 30/33]
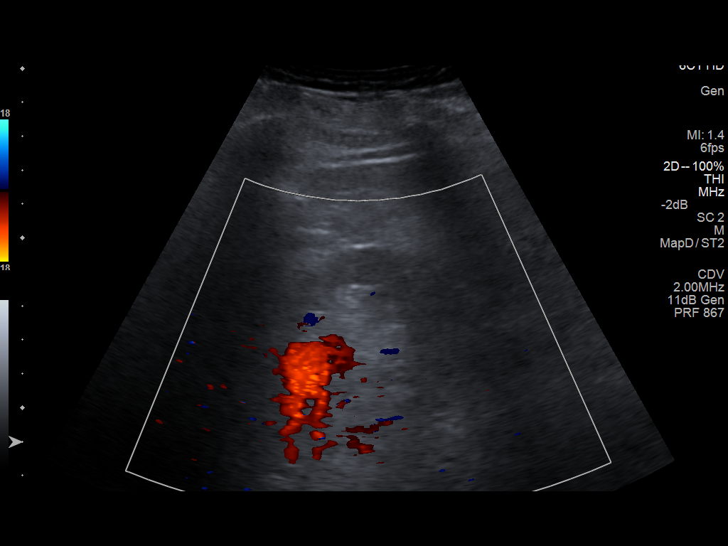
[im 33/33]
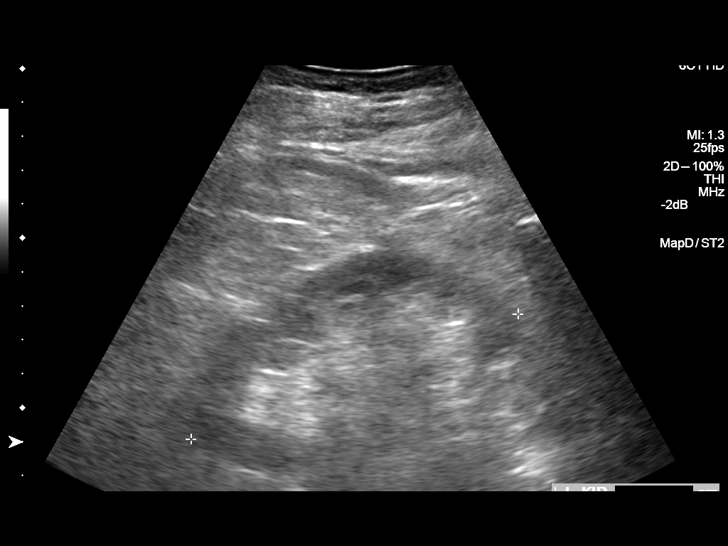

[14 of 25 positions shown; findings below may reference images not displayed]

FINDINGS: Right Kidney:

Length: 10.1 cm. Echogenicity within normal limits. No mass or
hydronephrosis visualized.

Left Kidney:

Length: 10.6 cm. Echogenicity within normal limits. No mass or
hydronephrosis visualized.

Bladder:

Decompressed secondary to Foley catheter.
IMPRESSION: Normal renal ultrasound.

## 2015-06-24 ENCOUNTER — Inpatient Hospital Stay: Payer: Medicare PPO | Attending: Oncology

## 2015-06-24 ENCOUNTER — Inpatient Hospital Stay: Payer: Medicare PPO

## 2015-06-24 ENCOUNTER — Inpatient Hospital Stay (HOSPITAL_BASED_OUTPATIENT_CLINIC_OR_DEPARTMENT_OTHER): Payer: Medicare PPO | Admitting: Oncology

## 2015-06-24 VITALS — BP 115/63 | HR 91 | Temp 96.7°F | Wt 183.1 lb

## 2015-06-24 DIAGNOSIS — E119 Type 2 diabetes mellitus without complications: Secondary | ICD-10-CM | POA: Insufficient documentation

## 2015-06-24 DIAGNOSIS — Z87891 Personal history of nicotine dependence: Secondary | ICD-10-CM | POA: Diagnosis not present

## 2015-06-24 DIAGNOSIS — G4730 Sleep apnea, unspecified: Secondary | ICD-10-CM | POA: Insufficient documentation

## 2015-06-24 DIAGNOSIS — N189 Chronic kidney disease, unspecified: Secondary | ICD-10-CM | POA: Diagnosis present

## 2015-06-24 DIAGNOSIS — Z7901 Long term (current) use of anticoagulants: Secondary | ICD-10-CM | POA: Insufficient documentation

## 2015-06-24 DIAGNOSIS — D631 Anemia in chronic kidney disease: Secondary | ICD-10-CM

## 2015-06-24 DIAGNOSIS — E785 Hyperlipidemia, unspecified: Secondary | ICD-10-CM | POA: Diagnosis not present

## 2015-06-24 DIAGNOSIS — I129 Hypertensive chronic kidney disease with stage 1 through stage 4 chronic kidney disease, or unspecified chronic kidney disease: Secondary | ICD-10-CM | POA: Diagnosis not present

## 2015-06-24 DIAGNOSIS — I2510 Atherosclerotic heart disease of native coronary artery without angina pectoris: Secondary | ICD-10-CM | POA: Diagnosis not present

## 2015-06-24 DIAGNOSIS — D696 Thrombocytopenia, unspecified: Secondary | ICD-10-CM | POA: Diagnosis not present

## 2015-06-24 DIAGNOSIS — I4891 Unspecified atrial fibrillation: Secondary | ICD-10-CM | POA: Insufficient documentation

## 2015-06-24 DIAGNOSIS — K219 Gastro-esophageal reflux disease without esophagitis: Secondary | ICD-10-CM | POA: Insufficient documentation

## 2015-06-24 DIAGNOSIS — D509 Iron deficiency anemia, unspecified: Secondary | ICD-10-CM

## 2015-06-24 DIAGNOSIS — Z7982 Long term (current) use of aspirin: Secondary | ICD-10-CM | POA: Diagnosis not present

## 2015-06-24 DIAGNOSIS — Z79899 Other long term (current) drug therapy: Secondary | ICD-10-CM | POA: Insufficient documentation

## 2015-06-24 LAB — CBC WITH DIFFERENTIAL/PLATELET
BASOS ABS: 0 10*3/uL (ref 0–0.1)
BASOS PCT: 0 %
EOS ABS: 0.1 10*3/uL (ref 0–0.7)
Eosinophils Relative: 1 %
HEMATOCRIT: 32.8 % — AB (ref 40.0–52.0)
Hemoglobin: 10.7 g/dL — ABNORMAL LOW (ref 13.0–18.0)
Lymphocytes Relative: 10 %
Lymphs Abs: 0.6 10*3/uL — ABNORMAL LOW (ref 1.0–3.6)
MCH: 33.4 pg (ref 26.0–34.0)
MCHC: 32.5 g/dL (ref 32.0–36.0)
MCV: 102.6 fL — ABNORMAL HIGH (ref 80.0–100.0)
MONO ABS: 0.6 10*3/uL (ref 0.2–1.0)
Monocytes Relative: 11 %
NEUTROS ABS: 4.1 10*3/uL (ref 1.4–6.5)
NEUTROS PCT: 78 %
PLATELETS: 80 10*3/uL — AB (ref 150–440)
RBC: 3.2 MIL/uL — ABNORMAL LOW (ref 4.40–5.90)
RDW: 15.1 % — AB (ref 11.5–14.5)
WBC: 5.3 10*3/uL (ref 3.8–10.6)

## 2015-06-24 LAB — IRON AND TIBC
IRON: 79 ug/dL (ref 45–182)
Saturation Ratios: 30 % (ref 17.9–39.5)
TIBC: 267 ug/dL (ref 250–450)
UIBC: 188 ug/dL

## 2015-06-24 LAB — FERRITIN: FERRITIN: 306 ng/mL (ref 24–336)

## 2015-07-09 NOTE — Progress Notes (Signed)
[place]  [phone] [phone]  ID: Glenda Chroman OB: 11-Sep-1919  MR#: [id number]  [id number]  Patient Care Team: [name], MD as PCP - General (Family Medicine)  CHIEF COMPLAINT:  Chief Complaint  Patient presents with  . Follow-up    IDA    INTERVAL HISTORY: Patient returns to clinic today for repeat laboratory work and routine evaluation. He does not complain of weakness or fatigue today. He continues to have mild shortness of breath. He denies any recent fevers or illnesses.  He denies any easy bleeding or bruising.  He has a good appetite and his weight is stable.  Patient denies any chest pain, cough, or hemoptysis.  He has no melena or hematochezia.  He denies any urinary complaints.  Patient offers no specific complaints today.   REVIEW OF SYSTEMS:   Review of Systems  Constitutional: Negative for fever and malaise/fatigue.  Respiratory: Positive for shortness of breath.   Cardiovascular: Negative.   Gastrointestinal: Negative.   Musculoskeletal: Negative.   Neurological: Negative.  Negative for weakness.    As per HPI. Otherwise, a complete review of systems is negatve.  PAST MEDICAL HISTORY: Past Medical History  Diagnosis Date  . Arthritis   . Bladder cancer   . Diabetes   . Glaucoma   . Hypertension   . Sleep apnea   . Atrial fibrillation   . CHF (congestive heart failure)   . Atrial fibrillation   . Anemia   . Esophageal reflux   . Dyslipidemia   . LVF (left ventricular failure)   . CAD (coronary artery disease)   . Chronic kidney disease     PAST SURGICAL HISTORY: Past Surgical History  Procedure Laterality Date  . Knee arthroscopy    . Aortic valve replacement    . Left hip hemiarthroplasty      FAMILY HISTORY Family History  Problem Relation Age of Onset  . Heart attack Father   . Hypertension Father        ADVANCED DIRECTIVES:    HEALTH MAINTENANCE: Social History  Substance  Use Topics  . Smoking status: Former Smoker    Types: Cigarettes  . Smokeless tobacco: Not on file  . Alcohol Use: Not on file     Colonoscopy:  PAP:  Bone density:  Lipid panel:  Allergies  Allergen Reactions  . Lidocaine Other (See Comments)    Reaction:  Hypotension   . Penicillins Other (See Comments)    Reaction:  Swollen feet     Current Outpatient Prescriptions  Medication Sig Dispense Refill  . allopurinol (ZYLOPRIM) 100 MG tablet Take 100 mg by mouth daily as needed (for gout flares).    Marland Kitchen aspirin EC 81 MG tablet Take 81 mg by mouth daily.    . carvedilol (COREG) 12.5 MG tablet Take 12.5 mg by mouth 2 (two) times daily.    . cholecalciferol (VITAMIN D) 1000 UNITS tablet Take 1,000 Units by mouth every other day.    . ferrous sulfate 325 (65 FE) MG tablet Take 325 mg by mouth daily.    . furosemide (LASIX) 40 MG tablet Take 1 tablet (40 mg total) by mouth 2 (two) times daily. 60 tablet 0  . metolazone (ZAROXOLYN) 2.5 MG tablet Take 2.5 mg by mouth daily as needed (for edema).    Marland Kitchen omeprazole (PRILOSEC) 20 MG capsule Take 20 mg by mouth daily.    Marland Kitchen oxyCODONE-acetaminophen (PERCOCET/ROXICET) 5-325 MG per tablet Take 1 tablet by mouth every  4 (four) hours as needed for severe pain.    . potassium chloride SA (K-DUR,KLOR-CON) 20 MEQ tablet Take 20 mEq by mouth daily.    . sertraline (ZOLOFT) 50 MG tablet Take 50 mg by mouth at bedtime.    . simvastatin (ZOCOR) 40 MG tablet Take 40 mg by mouth at bedtime.    . sitaGLIPtin (JANUVIA) 50 MG tablet Take 50 mg by mouth at bedtime.    . sitaGLIPtin (JANUVIA) 50 MG tablet TAKE 1 TABLET DAILY    . vitamin B-12 (CYANOCOBALAMIN) 1000 MCG tablet Take 1,000 mcg by mouth daily.    Marland Kitchen warfarin (COUMADIN) 2.5 MG tablet Take 2.5 mg by mouth daily. Pt takes on Monday, Wednesday, Friday, and Saturday.    . warfarin (COUMADIN) 5 MG tablet Take 5 mg by mouth daily. Pt takes on Tuesday, Thursday, and Sunday.     No current facility-administered  medications for this visit.    OBJECTIVE: Filed Vitals:   06/24/15 1403  BP: 115/63  Pulse: 91  Temp: 96.7 F (35.9 C)     Body mass index is 24.83 kg/(m^2).    ECOG FS:1 - Symptomatic but completely ambulatory  General: Well-developed, well-nourished, no acute distress. Eyes: Pink conjunctiva, anicteric sclera. Lungs: Clear to auscultation bilaterally. Heart: Regular rate and rhythm. No rubs, murmurs, or gallops. Abdomen: Soft, nontender, nondistended. No organomegaly noted, normoactive bowel sounds. Musculoskeletal: No edema, cyanosis, or clubbing. Neuro: Alert, answering all questions appropriately. Cranial nerves grossly intact. Skin: No rashes or petechiae noted. Psych: Normal affect.   LAB RESULTS:  Lab Results  Component Value Date   NA 141 04/22/2015   K 3.4* 04/22/2015   CL 94* 04/22/2015   CO2 36* 04/22/2015   GLUCOSE 106* 04/22/2015   BUN 51* 04/22/2015   CREATININE 1.74* 04/22/2015   CALCIUM 9.1 04/22/2015   PROT 7.4 04/19/2015   ALBUMIN 3.3* 04/20/2015   AST 27 04/19/2015   ALT 13* 04/19/2015   ALKPHOS 87 04/19/2015   BILITOT 1.2 04/19/2015   GFRNONAA 32* 04/22/2015   GFRAA 37* 04/22/2015    Lab Results  Component Value Date   WBC 5.3 06/24/2015   NEUTROABS 4.1 06/24/2015   HGB 10.7* 06/24/2015   HCT 32.8* 06/24/2015   MCV 102.6* 06/24/2015   PLT 80* 06/24/2015     STUDIES: No results found.  ASSESSMENT: Anemia and thrombocytopenia.  PLAN:    1.  Anemia:  Secondary to renal insufficiency. Patient's hemoglobin is 10.7, therefore he does not require additional Procrit today. Iron stores continue to be within normal limits. No intervention is needed at this time. Return to clinic in 3 months with repeat laboratory work and further evaluation. He may have underlying MDS, but this would taking a BMBx to diagnose which is not it necessary at this point.  Bone marrow from patient's hip fracture last year was unrevealing. 2. Thrombocytopenia:  Decreased, but approximately patient's baseline. Continue to monitor closely. Possible MDS as above. No intervention needed.   3. Chronic renal insufficiency: Patient's creatinine appears to be approximately his baseline. Treatment per nephrology.   Patient expressed understanding and was in agreement with this plan. He also understands that He can call clinic at any time with any questions, concerns, or complaints.     Lloyd Huger, MD   07/09/2015 6:49 PM

## 2015-08-15 ENCOUNTER — Emergency Department: Payer: Medicare PPO

## 2015-08-15 ENCOUNTER — Inpatient Hospital Stay
Admission: EM | Admit: 2015-08-15 | Discharge: 2015-08-18 | DRG: 291 | Disposition: A | Discharge status: Skilled Nursing Facility | Payer: Medicare PPO | Attending: Internal Medicine | Admitting: Internal Medicine

## 2015-08-15 ENCOUNTER — Encounter: Payer: Self-pay | Admitting: Emergency Medicine

## 2015-08-15 DIAGNOSIS — B9620 Unspecified Escherichia coli [E. coli] as the cause of diseases classified elsewhere: Secondary | ICD-10-CM | POA: Diagnosis present

## 2015-08-15 DIAGNOSIS — E1122 Type 2 diabetes mellitus with diabetic chronic kidney disease: Secondary | ICD-10-CM | POA: Diagnosis present

## 2015-08-15 DIAGNOSIS — I130 Hypertensive heart and chronic kidney disease with heart failure and stage 1 through stage 4 chronic kidney disease, or unspecified chronic kidney disease: Principal | ICD-10-CM | POA: Diagnosis present

## 2015-08-15 DIAGNOSIS — K219 Gastro-esophageal reflux disease without esophagitis: Secondary | ICD-10-CM | POA: Diagnosis present

## 2015-08-15 DIAGNOSIS — I2510 Atherosclerotic heart disease of native coronary artery without angina pectoris: Secondary | ICD-10-CM | POA: Diagnosis present

## 2015-08-15 DIAGNOSIS — N3000 Acute cystitis without hematuria: Secondary | ICD-10-CM

## 2015-08-15 DIAGNOSIS — N179 Acute kidney failure, unspecified: Secondary | ICD-10-CM | POA: Diagnosis present

## 2015-08-15 DIAGNOSIS — Z96642 Presence of left artificial hip joint: Secondary | ICD-10-CM | POA: Diagnosis present

## 2015-08-15 DIAGNOSIS — Z8551 Personal history of malignant neoplasm of bladder: Secondary | ICD-10-CM

## 2015-08-15 DIAGNOSIS — N390 Urinary tract infection, site not specified: Secondary | ICD-10-CM | POA: Diagnosis present

## 2015-08-15 DIAGNOSIS — N400 Enlarged prostate without lower urinary tract symptoms: Secondary | ICD-10-CM | POA: Diagnosis present

## 2015-08-15 DIAGNOSIS — K5730 Diverticulosis of large intestine without perforation or abscess without bleeding: Secondary | ICD-10-CM | POA: Diagnosis present

## 2015-08-15 DIAGNOSIS — Z7901 Long term (current) use of anticoagulants: Secondary | ICD-10-CM

## 2015-08-15 DIAGNOSIS — H409 Unspecified glaucoma: Secondary | ICD-10-CM | POA: Diagnosis present

## 2015-08-15 DIAGNOSIS — Z96651 Presence of right artificial knee joint: Secondary | ICD-10-CM | POA: Diagnosis present

## 2015-08-15 DIAGNOSIS — N184 Chronic kidney disease, stage 4 (severe): Secondary | ICD-10-CM | POA: Diagnosis present

## 2015-08-15 DIAGNOSIS — G4730 Sleep apnea, unspecified: Secondary | ICD-10-CM | POA: Diagnosis present

## 2015-08-15 DIAGNOSIS — Z952 Presence of prosthetic heart valve: Secondary | ICD-10-CM | POA: Diagnosis not present

## 2015-08-15 DIAGNOSIS — R1030 Lower abdominal pain, unspecified: Secondary | ICD-10-CM | POA: Diagnosis present

## 2015-08-15 DIAGNOSIS — I5031 Acute diastolic (congestive) heart failure: Secondary | ICD-10-CM | POA: Diagnosis present

## 2015-08-15 DIAGNOSIS — J449 Chronic obstructive pulmonary disease, unspecified: Secondary | ICD-10-CM | POA: Diagnosis present

## 2015-08-15 DIAGNOSIS — Z87891 Personal history of nicotine dependence: Secondary | ICD-10-CM

## 2015-08-15 DIAGNOSIS — M1990 Unspecified osteoarthritis, unspecified site: Secondary | ICD-10-CM | POA: Diagnosis present

## 2015-08-15 DIAGNOSIS — Z66 Do not resuscitate: Secondary | ICD-10-CM | POA: Diagnosis present

## 2015-08-15 DIAGNOSIS — Z9981 Dependence on supplemental oxygen: Secondary | ICD-10-CM

## 2015-08-15 DIAGNOSIS — Z801 Family history of malignant neoplasm of trachea, bronchus and lung: Secondary | ICD-10-CM | POA: Diagnosis not present

## 2015-08-15 DIAGNOSIS — E785 Hyperlipidemia, unspecified: Secondary | ICD-10-CM | POA: Diagnosis present

## 2015-08-15 DIAGNOSIS — Z950 Presence of cardiac pacemaker: Secondary | ICD-10-CM | POA: Diagnosis not present

## 2015-08-15 DIAGNOSIS — Z8249 Family history of ischemic heart disease and other diseases of the circulatory system: Secondary | ICD-10-CM | POA: Diagnosis not present

## 2015-08-15 DIAGNOSIS — I4891 Unspecified atrial fibrillation: Secondary | ICD-10-CM | POA: Diagnosis present

## 2015-08-15 DIAGNOSIS — I248 Other forms of acute ischemic heart disease: Secondary | ICD-10-CM | POA: Diagnosis present

## 2015-08-15 DIAGNOSIS — I509 Heart failure, unspecified: Secondary | ICD-10-CM

## 2015-08-15 DIAGNOSIS — I350 Nonrheumatic aortic (valve) stenosis: Secondary | ICD-10-CM | POA: Diagnosis present

## 2015-08-15 DIAGNOSIS — Z7982 Long term (current) use of aspirin: Secondary | ICD-10-CM

## 2015-08-15 DIAGNOSIS — D631 Anemia in chronic kidney disease: Secondary | ICD-10-CM | POA: Diagnosis present

## 2015-08-15 LAB — URINALYSIS COMPLETE WITH MICROSCOPIC (ARMC ONLY)
BILIRUBIN URINE: NEGATIVE
GLUCOSE, UA: NEGATIVE mg/dL
Hgb urine dipstick: NEGATIVE
Ketones, ur: NEGATIVE mg/dL
Nitrite: NEGATIVE
PH: 5 (ref 5.0–8.0)
Protein, ur: NEGATIVE mg/dL
RBC / HPF: NONE SEEN RBC/hpf (ref 0–5)
Specific Gravity, Urine: 1.011 (ref 1.005–1.030)

## 2015-08-15 LAB — CBC
HCT: 29.2 % — ABNORMAL LOW (ref 40.0–52.0)
Hemoglobin: 9.6 g/dL — ABNORMAL LOW (ref 13.0–18.0)
MCH: 34 pg (ref 26.0–34.0)
MCHC: 32.8 g/dL (ref 32.0–36.0)
MCV: 103.6 fL — ABNORMAL HIGH (ref 80.0–100.0)
PLATELETS: 108 10*3/uL — AB (ref 150–440)
RBC: 2.82 MIL/uL — ABNORMAL LOW (ref 4.40–5.90)
RDW: 15.3 % — ABNORMAL HIGH (ref 11.5–14.5)
WBC: 10.6 10*3/uL (ref 3.8–10.6)

## 2015-08-15 LAB — COMPREHENSIVE METABOLIC PANEL
ALBUMIN: 3 g/dL — AB (ref 3.5–5.0)
ALK PHOS: 88 U/L (ref 38–126)
ALT: 12 U/L — AB (ref 17–63)
AST: 18 U/L (ref 15–41)
Anion gap: 9 (ref 5–15)
BUN: 67 mg/dL — ABNORMAL HIGH (ref 6–20)
CALCIUM: 8.9 mg/dL (ref 8.9–10.3)
CO2: 31 mmol/L (ref 22–32)
CREATININE: 2.06 mg/dL — AB (ref 0.61–1.24)
Chloride: 97 mmol/L — ABNORMAL LOW (ref 101–111)
GFR calc non Af Amer: 26 mL/min — ABNORMAL LOW (ref >60)
GFR, EST AFRICAN AMERICAN: 30 mL/min — AB (ref >60)
GLUCOSE: 160 mg/dL — AB (ref 65–99)
Potassium: 4.2 mmol/L (ref 3.5–5.1)
SODIUM: 137 mmol/L (ref 135–145)
TOTAL PROTEIN: 6.7 g/dL (ref 6.5–8.1)
Total Bilirubin: 1.2 mg/dL (ref 0.3–1.2)

## 2015-08-15 LAB — TROPONIN I: Troponin I: 0.09 ng/mL — ABNORMAL HIGH (ref <0.031)

## 2015-08-15 LAB — BRAIN NATRIURETIC PEPTIDE: B Natriuretic Peptide: 1481 pg/mL — ABNORMAL HIGH (ref 0.0–100.0)

## 2015-08-15 LAB — LIPASE, BLOOD: Lipase: 19 U/L (ref 11–51)

## 2015-08-15 LAB — PROTIME-INR
INR: 1.45
PROTHROMBIN TIME: 17.7 s — AB (ref 11.4–15.0)

## 2015-08-15 MED ORDER — HYDROMORPHONE HCL 1 MG/ML IJ SOLN
0.5000 mg | Freq: Once | INTRAMUSCULAR | Status: AC
  Administered 2015-08-15: 0.5 mg via INTRAVENOUS
  Filled 2015-08-15: qty 1

## 2015-08-15 MED ORDER — MORPHINE SULFATE (PF) 2 MG/ML IV SOLN: 2.0000 mg | INTRAVENOUS | Status: DC | PRN

## 2015-08-15 MED ORDER — FUROSEMIDE 10 MG/ML IJ SOLN
60.0000 mg | Freq: Two times a day (BID) | INTRAMUSCULAR | Status: DC
  Administered 2015-08-16 – 2015-08-17 (×3): 60 mg via INTRAVENOUS
  Filled 2015-08-15 (×3): qty 6

## 2015-08-15 MED ORDER — ONDANSETRON HCL 4 MG/2ML IJ SOLN
4.0000 mg | Freq: Once | INTRAMUSCULAR | Status: AC
  Administered 2015-08-15: 4 mg via INTRAVENOUS
  Filled 2015-08-15: qty 2

## 2015-08-15 MED ORDER — FUROSEMIDE 10 MG/ML IJ SOLN
100.0000 mg | Freq: Once | INTRAVENOUS | Status: AC
  Administered 2015-08-15: 100 mg via INTRAVENOUS
  Filled 2015-08-15: qty 10

## 2015-08-15 MED ORDER — LINAGLIPTIN 5 MG PO TABS
5.0000 mg | ORAL_TABLET | Freq: Every day | ORAL | Status: DC
  Administered 2015-08-15 – 2015-08-18 (×4): 5 mg via ORAL
  Filled 2015-08-15 (×4): qty 1

## 2015-08-15 MED ORDER — OXYCODONE HCL 5 MG PO TABS: 5.0000 mg | ORAL_TABLET | ORAL | Status: DC | PRN

## 2015-08-15 MED ORDER — ALLOPURINOL 100 MG PO TABS
100.0000 mg | ORAL_TABLET | Freq: Every evening | ORAL | Status: DC
  Administered 2015-08-15 – 2015-08-17 (×3): 100 mg via ORAL
  Filled 2015-08-15 (×3): qty 1

## 2015-08-15 MED ORDER — ALBUTEROL SULFATE (2.5 MG/3ML) 0.083% IN NEBU: 3.0000 mL | INHALATION_SOLUTION | RESPIRATORY_TRACT | Status: DC | PRN

## 2015-08-15 MED ORDER — ACETAMINOPHEN 650 MG RE SUPP: 650.0000 mg | Freq: Four times a day (QID) | RECTAL | Status: DC | PRN

## 2015-08-15 MED ORDER — AZELASTINE HCL 0.1 % NA SOLN
2.0000 | Freq: Two times a day (BID) | NASAL | Status: DC
  Administered 2015-08-16 – 2015-08-18 (×5): 2 via NASAL
  Filled 2015-08-15: qty 30

## 2015-08-15 MED ORDER — ENOXAPARIN SODIUM 30 MG/0.3ML ~~LOC~~ SOLN
30.0000 mg | Freq: Every day | SUBCUTANEOUS | Status: DC
  Administered 2015-08-15 – 2015-08-17 (×3): 30 mg via SUBCUTANEOUS
  Filled 2015-08-15 (×3): qty 0.3

## 2015-08-15 MED ORDER — LEVOFLOXACIN IN D5W 500 MG/100ML IV SOLN: 500.0000 mg | INTRAVENOUS | Status: DC

## 2015-08-15 MED ORDER — LEVOFLOXACIN IN D5W 250 MG/50ML IV SOLN
250.0000 mg | INTRAVENOUS | Status: DC
  Administered 2015-08-16: 250 mg via INTRAVENOUS
  Filled 2015-08-15 (×2): qty 50

## 2015-08-15 MED ORDER — IOHEXOL 240 MG/ML SOLN
25.0000 mL | INTRAMUSCULAR | Status: AC
  Administered 2015-08-15 (×2): 25 mL via ORAL

## 2015-08-15 MED ORDER — METOLAZONE 2.5 MG PO TABS
2.5000 mg | ORAL_TABLET | Freq: Every day | ORAL | Status: DC
  Administered 2015-08-16 – 2015-08-18 (×3): 2.5 mg via ORAL
  Filled 2015-08-15 (×3): qty 1

## 2015-08-15 MED ORDER — VITAMIN D 1000 UNITS PO TABS
1000.0000 [IU] | ORAL_TABLET | ORAL | Status: DC
  Administered 2015-08-16 – 2015-08-18 (×3): 1000 [IU] via ORAL
  Filled 2015-08-15 (×3): qty 1

## 2015-08-15 MED ORDER — TAMSULOSIN HCL 0.4 MG PO CAPS
0.4000 mg | ORAL_CAPSULE | Freq: Every day | ORAL | Status: DC
  Administered 2015-08-15 – 2015-08-17 (×3): 0.4 mg via ORAL
  Filled 2015-08-15 (×3): qty 1

## 2015-08-15 MED ORDER — INSULIN ASPART 100 UNIT/ML ~~LOC~~ SOLN: 0.0000 [IU] | Freq: Every day | SUBCUTANEOUS | Status: DC

## 2015-08-15 MED ORDER — LEVOFLOXACIN IN D5W 500 MG/100ML IV SOLN
500.0000 mg | INTRAVENOUS | Status: DC
  Administered 2015-08-15: 500 mg via INTRAVENOUS
  Filled 2015-08-15: qty 100

## 2015-08-15 MED ORDER — SODIUM CHLORIDE 0.9 % IV BOLUS (SEPSIS)
250.0000 mL | Freq: Once | INTRAVENOUS | Status: AC
  Administered 2015-08-15: 250 mL via INTRAVENOUS

## 2015-08-15 MED ORDER — INSULIN ASPART 100 UNIT/ML ~~LOC~~ SOLN
0.0000 [IU] | Freq: Three times a day (TID) | SUBCUTANEOUS | Status: DC
  Administered 2015-08-16 – 2015-08-18 (×5): 1 [IU] via SUBCUTANEOUS
  Filled 2015-08-15 (×5): qty 1

## 2015-08-15 MED ORDER — PANTOPRAZOLE SODIUM 40 MG PO TBEC
40.0000 mg | DELAYED_RELEASE_TABLET | Freq: Every day | ORAL | Status: DC
  Administered 2015-08-16 – 2015-08-18 (×3): 40 mg via ORAL
  Filled 2015-08-15 (×3): qty 1

## 2015-08-15 MED ORDER — SODIUM CHLORIDE 0.9 % IJ SOLN
3.0000 mL | Freq: Two times a day (BID) | INTRAMUSCULAR | Status: DC
  Administered 2015-08-15 – 2015-08-18 (×6): 3 mL via INTRAVENOUS

## 2015-08-15 MED ORDER — ASPIRIN EC 81 MG PO TBEC
81.0000 mg | DELAYED_RELEASE_TABLET | ORAL | Status: DC
  Administered 2015-08-16 – 2015-08-18 (×3): 81 mg via ORAL
  Filled 2015-08-15 (×3): qty 1

## 2015-08-15 MED ORDER — VITAMIN B-12 1000 MCG PO TABS
1000.0000 ug | ORAL_TABLET | ORAL | Status: DC
  Administered 2015-08-16 – 2015-08-18 (×3): 1000 ug via ORAL
  Filled 2015-08-15 (×3): qty 1

## 2015-08-15 MED ORDER — DIFLUPREDNATE 0.05 % OP EMUL
1.0000 [drp] | Freq: Two times a day (BID) | OPHTHALMIC | Status: DC
  Administered 2015-08-16 – 2015-08-18 (×5): 1 [drp] via OPHTHALMIC
  Filled 2015-08-15: qty 5

## 2015-08-15 MED ORDER — ACETAMINOPHEN 325 MG PO TABS: 650.0000 mg | ORAL_TABLET | Freq: Four times a day (QID) | ORAL | Status: DC | PRN

## 2015-08-15 MED ORDER — FERROUS SULFATE 325 (65 FE) MG PO TABS
325.0000 mg | ORAL_TABLET | Freq: Every evening | ORAL | Status: DC
  Administered 2015-08-15 – 2015-08-17 (×3): 325 mg via ORAL
  Filled 2015-08-15 (×3): qty 1

## 2015-08-15 MED ORDER — POTASSIUM CHLORIDE CRYS ER 20 MEQ PO TBCR
20.0000 meq | EXTENDED_RELEASE_TABLET | ORAL | Status: DC
  Administered 2015-08-16 – 2015-08-17 (×2): 20 meq via ORAL
  Filled 2015-08-15 (×2): qty 1

## 2015-08-15 MED ORDER — SERTRALINE HCL 25 MG PO TABS
50.0000 mg | ORAL_TABLET | Freq: Every evening | ORAL | Status: DC
  Administered 2015-08-15 – 2015-08-17 (×3): 50 mg via ORAL
  Filled 2015-08-15 (×3): qty 2

## 2015-08-15 MED ORDER — SIMVASTATIN 40 MG PO TABS
40.0000 mg | ORAL_TABLET | Freq: Every day | ORAL | Status: DC
  Administered 2015-08-15 – 2015-08-17 (×3): 40 mg via ORAL
  Filled 2015-08-15 (×3): qty 1

## 2015-08-15 NOTE — ED Notes (Signed)
Pt arrived via EMS from home. Per EMS family states patients abdominal pain started during the middle of the night and has been having diarrhea and bad gas. Family also told EMS that they think he has some fluid retention. Pt also c/o left heel pain.  Abdominal pain is in lower middle.

## 2015-08-15 NOTE — ED Provider Notes (Signed)
Time Seen: Approximately ----------------------------------------- 3:24 PM on 08/15/2015 -----------------------------------------   I have reviewed the triage notes  Chief Complaint: Abdominal Pain   History of Present Illness: [name] is a 82 y.o. male who presents with abdominal pain that's diffuse and crampy that started at 1:30 this morning. Patient states the abdominal pain woke him up. He states it seems to have decreased throughout the day. He points mainly to the periumbilical area but describes pain above and below the umbilicus. He denies any back or flank pain. He states he's had nausea with no persistent vomiting. 2 loose stools with no blood. There was no fever at home. He denies any chest pain that's had a history of a right-sided pleural effusion. Patient denies any dysuria, hematuria, or urinary frequency. Patient denies any chest or pleuritic discomfort.   Past Medical History  Diagnosis Date  . Arthritis   . Bladder cancer ([place])   . Diabetes ([place])   . Glaucoma   . Hypertension   . Sleep apnea   . Atrial fibrillation ([place])   . CHF (congestive heart failure) ([place])   . Atrial fibrillation ([place])   . Anemia   . Esophageal reflux   . Dyslipidemia   . LVF (left ventricular failure) ([place])   . CAD (coronary artery disease)   . Chronic kidney disease     Patient Active Problem List   Diagnosis Date Noted  . Pleural effusion 04/19/2015  . Thrombocytopenia ([place]) 02/18/2015    Past Surgical History  Procedure Laterality Date  . Knee arthroscopy    . Aortic valve replacement    . Left hip hemiarthroplasty      Past Surgical History  Procedure Laterality Date  . Knee arthroscopy    . Aortic valve replacement    . Left hip hemiarthroplasty      Current Outpatient Rx  Name  Route  Sig  Dispense  Refill  . allopurinol (ZYLOPRIM) 100 MG tablet   Oral   Take 100 mg by mouth daily as needed (for gout flares).         Marland Kitchen aspirin EC 81 MG tablet    Oral   Take 81 mg by mouth daily.         . carvedilol (COREG) 12.5 MG tablet   Oral   Take 12.5 mg by mouth 2 (two) times daily.         . cholecalciferol (VITAMIN D) 1000 UNITS tablet   Oral   Take 1,000 Units by mouth every other day.         . ferrous sulfate 325 (65 FE) MG tablet   Oral   Take 325 mg by mouth daily.         . furosemide (LASIX) 40 MG tablet   Oral   Take 1 tablet (40 mg total) by mouth 2 (two) times daily.   60 tablet   0   . metolazone (ZAROXOLYN) 2.5 MG tablet   Oral   Take 2.5 mg by mouth daily as needed (for edema).         Marland Kitchen omeprazole (PRILOSEC) 20 MG capsule   Oral   Take 20 mg by mouth daily.         Marland Kitchen oxyCODONE-acetaminophen (PERCOCET/ROXICET) 5-325 MG per tablet   Oral   Take 1 tablet by mouth every 4 (four) hours as needed for severe pain.         . potassium chloride SA (K-DUR,KLOR-CON) 20 MEQ tablet  Oral   Take 20 mEq by mouth daily.         . sertraline (ZOLOFT) 50 MG tablet   Oral   Take 50 mg by mouth at bedtime.         . simvastatin (ZOCOR) 40 MG tablet   Oral   Take 40 mg by mouth at bedtime.         . sitaGLIPtin (JANUVIA) 50 MG tablet   Oral   Take 50 mg by mouth at bedtime.         . sitaGLIPtin (JANUVIA) 50 MG tablet      TAKE 1 TABLET DAILY         . vitamin B-12 (CYANOCOBALAMIN) 1000 MCG tablet   Oral   Take 1,000 mcg by mouth daily.         Marland Kitchen warfarin (COUMADIN) 2.5 MG tablet   Oral   Take 2.5 mg by mouth daily. Pt takes on Monday, Wednesday, Friday, and Saturday.         . warfarin (COUMADIN) 5 MG tablet   Oral   Take 5 mg by mouth daily. Pt takes on Tuesday, Thursday, and Sunday.           Allergies:  Lidocaine and Penicillins  Family History: Family History  Problem Relation Age of Onset  . Heart attack Father   . Hypertension Father     Social History: Social History  Substance Use Topics  . Smoking status: Former Smoker    Types: Cigarettes  .  Smokeless tobacco: None  . Alcohol Use: None     Review of Systems:   10 point review of systems was performed and was otherwise negative:  Constitutional: No fever Eyes: No visual disturbances ENT: No sore throat, ear pain Cardiac: No chest pain Respiratory: No shortness of breath, wheezing, or stridor Abdomen: , no vomiting, No diarrhea Endocrine: No weight loss, No night sweats Extremities: No peripheral edema, cyanosis Skin: No rashes, easy bruising Neurologic: No focal weakness, trouble with speech or swollowing Urologic: No dysuria, Hematuria, or urinary frequency   Physical Exam:  ED Triage Vitals  Enc Vitals Group     BP 08/15/15 1412 131/67 mmHg     Pulse Rate 08/15/15 1412 64     Resp 08/15/15 1412 17     Temp 08/15/15 1412 98.6 F (37 C)     Temp Source 08/15/15 1412 Oral     SpO2 08/15/15 1412 100 %     Weight 08/15/15 1412 179 lb (81.194 kg)     Height 08/15/15 1412 5' 9.5" (1.765 m)     Head Cir --      Peak Flow --      Pain Score 08/15/15 1401 5     Pain Loc --      Pain Edu? --      Excl. in [place]? --     General: Awake , Alert , and Oriented times 3; GCS 15 Head: Normal cephalic , atraumatic Eyes: Pupils equal , round, reactive to light Nose/Throat: No nasal drainage, patent upper airway without erythema or exudate.  Neck: Supple, Full range of motion, No anterior adenopathy or palpable thyroid masses Lungs: Clear to ascultation without wheezes , rhonchi, or rales Heart: Regular rate, regular rhythm without murmurs , gallops , or rubs Abdomen: Patient has diffuse tenderness with some mild guarding. No rebound or rigidity is noted. Bowel sounds are positive and symmetric somewhat hyperactive in all 4 quadrants. There is no palpable  masses. No focal pain over McBurney's point. Negative Murphy's sign..        Extremities: 2 plus symmetric pulses. No edema, clubbing or cyanosis Neurologic: normal ambulation, Motor symmetric without deficits, sensory  intact Skin: warm, dry, no rashes   Labs:   All laboratory work was reviewed including any pertinent negatives or positives listed below:  Labs Reviewed  COMPREHENSIVE METABOLIC PANEL - Abnormal; Notable for the following:    Chloride 97 (*)    Glucose, Bld 160 (*)    BUN 67 (*)    Creatinine, Ser 2.06 (*)    Albumin 3.0 (*)    ALT 12 (*)    GFR calc non Af Amer 26 (*)    GFR calc Af Amer 30 (*)    All other components within normal limits  CBC - Abnormal; Notable for the following:    RBC 2.82 (*)    Hemoglobin 9.6 (*)    HCT 29.2 (*)    MCV 103.6 (*)    RDW 15.3 (*)    Platelets 108 (*)    All other components within normal limits  URINALYSIS COMPLETEWITH MICROSCOPIC ([facility] ONLY) - Abnormal; Notable for the following:    Color, Urine YELLOW (*)    APPearance CLOUDY (*)    Leukocytes, UA 3+ (*)    Bacteria, UA MANY (*)    Squamous Epithelial / LPF 0-5 (*)    All other components within normal limits  TROPONIN I - Abnormal; Notable for the following:    Troponin I 0.09 (*)    All other components within normal limits  URINE CULTURE  LIPASE, BLOOD    EKG:  ED ECG REPORT I, Daymon Larsen, the attending physician, personally viewed and interpreted this ECG.  Date: 08/15/2015 EKG Time: 1405 Rate: 64 Rhythm: normal sinus rhythm QRS Axis: normal Intervals: normal ST/T Wave abnormalities: normal Conduction Disutrbances: none Narrative Interpretation: unremarkable    Radiology: *   CT ABDOMEN AND PELVIS WITHOUT CONTRAST  TECHNIQUE: Multidetector CT imaging of the abdomen and pelvis was performed following the standard protocol without IV contrast.  COMPARISON: None.  FINDINGS: Moderate to moderately large right pleural effusion is present. There is a small left pleural effusion. Cardiomegaly is noted. Calcific aortic and coronary atherosclerosis is seen.  A small volume of abdominal and pelvic ascites is present. Gallstones are noted without  evidence of cholecystitis. The liver, spleen, adrenal glands, pancreas and left kidney are unremarkable. A small cyst off the lower pole of the right kidney is noted.  The patient has extensive aortoiliac atherosclerosis without aneurysm. Diverticulosis of the colon is worst in the sigmoid. The transverse colon is decompressed but otherwise unremarkable. The stomach is also largely decompressed. Small bowel loops appear normal. Body wall edema is identified. No focal fluid collection is seen. Small fat containing inguinal hernias are identified. There is some ascitic fluid in the left hernia.  No lytic or sclerotic bony lesion is seen. The patient is status post left hip replacement.  IMPRESSION: Right greater than left pleural effusions, small to moderate volume of abdominal pelvic ascites and body wall edema and compatible with anasarca. Cause is not identified.  Extensive atherosclerotic vascular disease without aneurysm.  Mild cardiomegaly.  Gallstones without evidence of cholecystitis.  Diverticulosis without diverticulitis.   Electronically Signed By: Inge Rise M.D. On: 08/15/2015 18:16          DG Chest 2 View (Final result) Result time: 08/15/15 16:35:57   Final result by Rad Results  In Interface (08/15/15 16:35:57)   Narrative:   CLINICAL DATA: History of congestive heart area and pulmonary fusion. Fluid retention and abdominal pain.  EXAM: CHEST 2 VIEW  COMPARISON: 04/22/2015  FINDINGS: Dual lead cardiac pacemaker is stable. Postsurgical changes in the thorax are also stable.  Cardiomediastinal silhouette is enlarged. Mediastinal contours appear intact.  There is no evidence of focal airspace consolidation, or pneumothorax. There is persistent interstitial pulmonary edema, and layering right pleural effusion. There is a probable smaller left pleural effusion.  Osseous structures are without acute abnormality. Soft tissues  are grossly normal.  IMPRESSION: Interstitial pulmonary edema.  Moderate in size right pleural effusion. Probable small left pleural effusion.  Enlarged cardiac silhouette.   Electronically Signed By: Fidela Salisbury M.D.      I personally reviewed the radiologic studies   P  ED Course:  Differential diagnosis includes but is not exclusive to acute appendicitis, renal colic, testicular torsion, urinary tract infection, prostatitis,  diverticulitis, small bowel obstruction, colitis, abdominal aortic aneurysm, gastroenteritis, etc. Given the patient's current clinical presentation and objective findings appears he has anasarca associated with a urinary tract infection. Patient was initiated on IV antibiotics and has urine culture pending at this time. He also appears to have increased the fluid content not only with the anasarca but also redevelopment of a right-sided pleural effusion. Patient has a history of renal insufficiency and was only given a 250 mL fluid bolus here and may need further diuretic therapy.    Assessment:  Acute lower abdominal pain male Urinary tract infection Anasarca      Plan: Inpatient management            Daymon Larsen, MD 08/15/15 1949

## 2015-08-15 NOTE — H&P (Signed)
[facility] [place] NAME: [name]    MR#:  [id number]  DATE OF BIRTH:  [birth year]  DATE OF ADMISSION:  08/15/2015  PRIMARY CARE PHYSICIAN: [name], MD   REQUESTING/REFERRING PHYSICIAN: Dr. Meade Maw  CHIEF COMPLAINT:   Chief Complaint  Patient presents with  . Abdominal Pain    HISTORY OF PRESENT ILLNESS:  [name]  is a 82 y.o. male with a known history of CHF, COPD on chronic oxygen and chronic kidney disease. He presents with abdominal pain 8 out of 10 intensity that comes and goes and eases off a little bit. 8 out of 10 intensity and now down to 5 out of 10 intensity. A bowel movement helps and it's worse with moving around. In the ER he had a CT scan of the abdomen and pelvis that showed some ascites but otherwise negative. He was found to have a urinary infection. Because of his diffuse tenderness in the abdomen hospitalist services were contacted for further evaluation. Patient always Has shortness of breath and has been gaining weight and some fluid recently. His legs have become more swollen. As per the family he has been urinating less recently.  PAST MEDICAL HISTORY:   Past Medical History  Diagnosis Date  . Arthritis   . Bladder cancer ([place])   . Diabetes ([place])   . Glaucoma   . Hypertension   . Sleep apnea   . Atrial fibrillation ([place])   . CHF (congestive heart failure) ([place])   . Atrial fibrillation ([place])   . Anemia   . Esophageal reflux   . Dyslipidemia   . LVF (left ventricular failure) ([place])   . CAD (coronary artery disease)   . Chronic kidney disease     PAST SURGICAL HISTORY:   Past Surgical History  Procedure Laterality Date  . Knee arthroscopy    . Aortic valve replacement    . Left hip hemiarthroplasty      SOCIAL HISTORY:   Social History  Substance Use Topics  . Smoking status: Former Smoker    Types: Cigarettes  . Smokeless tobacco: Not on file  . Alcohol Use: Not on  file    FAMILY HISTORY:   Family History  Problem Relation Age of Onset  . Heart attack Father   . Hypertension Father   . Lung cancer Sister     DRUG ALLERGIES:   Allergies  Allergen Reactions  . Lidocaine Other (See Comments)    Reaction:  Hypotension   . Penicillins Other (See Comments)    Reaction:  Swollen feet Has patient had a PCN reaction causing immediate rash, facial/tongue/throat swelling, SOB or lightheadedness with hypotension: Yes Has patient had a PCN reaction causing severe rash involving mucus membranes or skin necrosis: No Has patient had a PCN reaction that required hospitalization No Has patient had a PCN reaction occurring within the last 10 years: No If all of the above answers are "NO", then may proceed with Cephalosporin use.    REVIEW OF SYSTEMS:  CONSTITUTIONAL: No fever, positive for fatigue.  EYES: Left eye blurred vision followed with the eye doctor and workup for temporal arteritis underway  EARS, NOSE, AND THROAT: No tinnitus or ear pain. No sore throat. Positive for runny nose. Decreased hearing RESPIRATORY: Positive for cough and shortness of breath, no wheezing or hemoptysis.  CARDIOVASCULAR: No chest pain, orthopnea, edema.  GASTROINTESTINAL: Positive for nausea and abdominal pain. No blood in bowel movements. Occasional diarrhea  GENITOURINARY: No dysuria, hematuria.  ENDOCRINE: No polyuria, nocturia,  HEMATOLOGY: No anemia, easy bruising or bleeding SKIN: No rash or lesion. MUSCULOSKELETAL: Positive for joint pains   NEUROLOGIC: No tingling, numbness, weakness.  PSYCHIATRY: No anxiety or depression.   MEDICATIONS AT HOME:   Prior to Admission medications   Medication Sig Start Date End Date Taking? Authorizing Provider  allopurinol (ZYLOPRIM) 100 MG tablet Take 100 mg by mouth every evening.    Yes Historical Provider, MD  aspirin EC 81 MG tablet Take 81 mg by mouth every morning.    Yes Historical Provider, MD  azelastine (ASTELIN)  0.1 % nasal spray Place 2 sprays into both nostrils 2 (two) times daily. Use in each nostril as directed   Yes Historical Provider, MD  carvedilol (COREG) 12.5 MG tablet Take 12.5 mg by mouth every morning.    Yes Historical Provider, MD  cholecalciferol (VITAMIN D) 1000 UNITS tablet Take 1,000 Units by mouth every morning.    Yes Historical Provider, MD  DUREZOL 0.05 % EMUL Place 1 drop into the left eye 2 (two) times daily. 08/11/15  Yes Historical Provider, MD  ferrous sulfate 325 (65 FE) MG tablet Take 325 mg by mouth every evening.    Yes Historical Provider, MD  furosemide (LASIX) 40 MG tablet Take 1 tablet (40 mg total) by mouth 2 (two) times daily. 04/22/15  Yes Demetrios Loll, MD  metolazone (ZAROXOLYN) 2.5 MG tablet Take 2.5 mg by mouth daily as needed (for edema). *Based on weight- 177lbs take this medication and 174lbs don't take this medication*   Yes Historical Provider, MD  omeprazole (PRILOSEC) 20 MG capsule Take 20 mg by mouth every morning.    Yes Historical Provider, MD  potassium chloride SA (K-DUR,KLOR-CON) 20 MEQ tablet Take 20 mEq by mouth every morning.    Yes Historical Provider, MD  PROAIR HFA 108 (90 Base) MCG/ACT inhaler Inhale 2 puffs into the lungs every 4 (four) hours as needed. For wheezing or shortness of breath. 07/02/15  Yes Historical Provider, MD  sertraline (ZOLOFT) 50 MG tablet Take 50 mg by mouth every evening.    Yes Historical Provider, MD  simvastatin (ZOCOR) 40 MG tablet Take 40 mg by mouth at bedtime.   Yes Historical Provider, MD  sitaGLIPtin (JANUVIA) 50 MG tablet Take 50 mg by mouth every evening.    Yes Historical Provider, MD  vitamin B-12 (CYANOCOBALAMIN) 1000 MCG tablet Take 1,000 mcg by mouth every morning.    Yes Historical Provider, MD      VITAL SIGNS:  Blood pressure 124/79, pulse 61, temperature 98.6 F (37 C), temperature source Oral, resp. rate 16, height 5' 9.5" (1.765 m), weight 81.194 kg (179 lb), SpO2 99 %.  PHYSICAL EXAMINATION:  GENERAL:   82 y.o.-year-old patient lying in the bed with no acute distress.  EYES: Pupils equal, round, reactive to light and accommodation. No scleral icterus. Extraocular muscles intact.  HEENT: Head atraumatic, normocephalic. Oropharynx and nasopharynx clear.  NECK:  Supple, no jugular venous distention. No thyroid enlargement, no tenderness.  LUNGS: Decreased bases breath sounds bilaterally, no wheezing, positive rales bilateral bases, no rhonchi or crepitation. No use of accessory muscles of respiration.  CARDIOVASCULAR: S1, S2 bradycardic. 2/6 systolic murmur, no rubs, or gallops.  ABDOMEN: Soft, tender lower abdomen over the bladder, nondistended. Bowel sounds present. No organomegaly or mass.  EXTREMITIES: 2+ edema, no cyanosis, or clubbing.  NEUROLOGIC: Cranial nerves II through XII are intact. Muscle strength 5/5 in all extremities. Sensation  intact. Gait not checked.  PSYCHIATRIC: The patient is alert and oriented x 3.  SKIN: No rash, lesion, or ulcer.   LABORATORY PANEL:   CBC  Recent Labs Lab 08/15/15 1407  WBC 10.6  HGB 9.6*  HCT 29.2*  PLT 108*   ------------------------------------------------------------------------------------------------------------------  Chemistries   Recent Labs Lab 08/15/15 1407  NA 137  K 4.2  CL 97*  CO2 31  GLUCOSE 160*  BUN 67*  CREATININE 2.06*  CALCIUM 8.9  AST 18  ALT 12*  ALKPHOS 88  BILITOT 1.2   ------------------------------------------------------------------------------------------------------------------  Cardiac Enzymes  Recent Labs Lab 08/15/15 1407  TROPONINI 0.09*   ------------------------------------------------------------------------------------------------------------------  RADIOLOGY:  Ct Abdomen Pelvis Wo Contrast  08/15/2015  CLINICAL DATA:  Abdominal pain beginning last night. Diarrhea. Initial encounter. EXAM: CT ABDOMEN AND PELVIS WITHOUT CONTRAST TECHNIQUE: Multidetector CT imaging of the abdomen and  pelvis was performed following the standard protocol without IV contrast. COMPARISON:  None. FINDINGS: Moderate to moderately large right pleural effusion is present. There is a small left pleural effusion. Cardiomegaly is noted. Calcific aortic and coronary atherosclerosis is seen. A small volume of abdominal and pelvic ascites is present. Gallstones are noted without evidence of cholecystitis. The liver, spleen, adrenal glands, pancreas and left kidney are unremarkable. A small cyst off the lower pole of the right kidney is noted. The patient has extensive aortoiliac atherosclerosis without aneurysm. Diverticulosis of the colon is worst in the sigmoid. The transverse colon is decompressed but otherwise unremarkable. The stomach is also largely decompressed. Small bowel loops appear normal. Body wall edema is identified. No focal fluid collection is seen. Small fat containing inguinal hernias are identified. There is some ascitic fluid in the left hernia. No lytic or sclerotic bony lesion is seen. The patient is status post left hip replacement. IMPRESSION: Right greater than left pleural effusions, small to moderate volume of abdominal pelvic ascites and body wall edema and compatible with anasarca. Cause is not identified. Extensive atherosclerotic vascular disease without aneurysm. Mild cardiomegaly. Gallstones without evidence of cholecystitis. Diverticulosis without diverticulitis. Electronically Signed   By: Inge Rise M.D.   On: 08/15/2015 18:16   Dg Chest 2 View  08/15/2015  CLINICAL DATA:  History of congestive heart area and pulmonary fusion. Fluid retention and abdominal pain. EXAM: CHEST  2 VIEW COMPARISON:  04/22/2015 FINDINGS: Dual lead cardiac pacemaker is stable. Postsurgical changes in the thorax are also stable. Cardiomediastinal silhouette is enlarged. Mediastinal contours appear intact. There is no evidence of focal airspace consolidation, or pneumothorax. There is persistent  interstitial pulmonary edema, and layering right pleural effusion. There is a probable smaller left pleural effusion. Osseous structures are without acute abnormality. Soft tissues are grossly normal. IMPRESSION: Interstitial pulmonary edema. Moderate in size right pleural effusion. Probable small left pleural effusion. Enlarged cardiac silhouette. Electronically Signed   By: Fidela Salisbury M.D.   On: 08/15/2015 16:35    EKG:   Junctional rhythm 64 bpm  IMPRESSION AND PLAN:   1. Acute CHF. I will give Lasix 100 mg IV 1. Hold Coreg with junctional rhythm. Monitor on telemetry and get serial cardiac enzymes and check an echocardiogram 2. Lower abdominal pain. Could be from acute cystitis. Bladder scan only showed 100-300 in the bladder. We'll continue to monitor that closely. Continue Levaquin. Follow-up urine culture. Start Flomax for BPH 3. Junctional rhythm on EKG- hold Coreg for right now monitor on telemetry 4. COPD on 5 L of oxygen 5. Chronic kidney disease- watch closely with diuresis.  Patient's family states that he normally goes on Lasix drip for a few days and Dr. Candiss Norse nephrology follows. I will consult nephrology for this 6. Type 2 diabetes- continue oral medication and put on sliding scale 7. Elevated troponin- could be false positive with chronic kidney disease versus demand ischemia with CHF.  All the records are reviewed and case discussed with ED provider. Management plans discussed with the patient, family and they are in agreement.  CODE STATUS: DO NOT RESUSCITATE  TOTAL TIME TAKING CARE OF THIS PATIENT: 55 minutes.    Loletha Grayer M.D on 08/15/2015 at 7:15 PM  Between 7am to 6pm - Pager - [phone]  After 6pm call admission pager [facility]  Office  [phone]  CC: Primary care physician; [name], MD

## 2015-08-16 LAB — BASIC METABOLIC PANEL
Anion gap: 9 (ref 5–15)
BUN: 70 mg/dL — ABNORMAL HIGH (ref 6–20)
CHLORIDE: 98 mmol/L — AB (ref 101–111)
CO2: 31 mmol/L (ref 22–32)
Calcium: 8.4 mg/dL — ABNORMAL LOW (ref 8.9–10.3)
Creatinine, Ser: 1.87 mg/dL — ABNORMAL HIGH (ref 0.61–1.24)
GFR calc non Af Amer: 29 mL/min — ABNORMAL LOW (ref >60)
GFR, EST AFRICAN AMERICAN: 34 mL/min — AB (ref >60)
Glucose, Bld: 127 mg/dL — ABNORMAL HIGH (ref 65–99)
POTASSIUM: 4 mmol/L (ref 3.5–5.1)
SODIUM: 138 mmol/L (ref 135–145)

## 2015-08-16 LAB — GLUCOSE, CAPILLARY
Glucose-Capillary: 127 mg/dL — ABNORMAL HIGH (ref 65–99)
Glucose-Capillary: 98 mg/dL (ref 65–99)

## 2015-08-16 LAB — CBC
HEMATOCRIT: 26.7 % — AB (ref 40.0–52.0)
Hemoglobin: 9 g/dL — ABNORMAL LOW (ref 13.0–18.0)
MCH: 34.6 pg — ABNORMAL HIGH (ref 26.0–34.0)
MCHC: 33.7 g/dL (ref 32.0–36.0)
MCV: 102.7 fL — AB (ref 80.0–100.0)
PLATELETS: 91 10*3/uL — AB (ref 150–440)
RBC: 2.6 MIL/uL — AB (ref 4.40–5.90)
RDW: 15.3 % — ABNORMAL HIGH (ref 11.5–14.5)
WBC: 12.1 10*3/uL — AB (ref 3.8–10.6)

## 2015-08-16 LAB — TROPONIN I
Troponin I: 0.1 ng/mL — ABNORMAL HIGH (ref <0.031)
Troponin I: 0.1 ng/mL — ABNORMAL HIGH (ref <0.031)

## 2015-08-16 MED ORDER — PHENAZOPYRIDINE HCL 100 MG PO TABS
100.0000 mg | ORAL_TABLET | Freq: Every day | ORAL | Status: AC
  Administered 2015-08-17: 100 mg via ORAL
  Filled 2015-08-16: qty 1

## 2015-08-16 MED ORDER — PHENAZOPYRIDINE HCL 100 MG PO TABS
100.0000 mg | ORAL_TABLET | Freq: Three times a day (TID) | ORAL | Status: DC
  Administered 2015-08-16 (×2): 100 mg via ORAL
  Filled 2015-08-16 (×3): qty 1

## 2015-08-16 NOTE — Plan of Care (Signed)
Problem: Cardiac: Goal: Ability to achieve and maintain adequate cardiopulmonary perfusion will improve Outcome: Progressing Patient VS is currently stabled

## 2015-08-16 NOTE — Evaluation (Signed)
Physical Therapy Evaluation Patient Details Name: [name] MRN: [id number] DOB: 1920/03/25 Today's Date: 08/16/2015   History of Present Illness  presented to ER secondary to abdominal pain; admitted with UTI, CHF.  Clinical Impression  Upon evaluation, patient alert and oriented; follows all commands and demonstrates good safety awareness/insight.  Bilat UE/LE strength and ROM grossly symmetrical and WFL for basic transfers and mobility (except L shoulder end-range elevation limited, history of previous fracture).  Currently requiring mod assist for sit/stand, min assist for all activities (basic transfers and gait x75') with RW once upright.  Limited balance reactions evident, as patient relies primarily on UE support and LE step strategy for any external perturbation/LOB.  Very slow and deliberate in gait performance. Mod SOB with exertion, BORG 5/10.  Desat to 89% on 4L with exertion requiring 60-90 seconds seated rest and pursed lip breathing for recovery. Would benefit from skilled PT to address above deficits and promote optimal return to PLOF; Recommend transition to [place] with 24 hour sup/assist (family able to provide) upon discharge from acute hospitalization.     Follow Up Recommendations Home health PT;Supervision/Assistance - 24 hour    Equipment Recommendations       Recommendations for Other Services       Precautions / Restrictions Precautions Precautions: Fall Restrictions Weight Bearing Restrictions: No      Mobility  Bed Mobility               General bed mobility comments: seated in recliner beginning/end of session  Transfers Overall transfer level: Needs assistance Equipment used: Rolling walker (2 wheeled) Transfers: Sit to/from Stand Sit to Stand: Mod assist         General transfer comment: multiple attempts, heavy use of UEs to initiate/complete lift off from recliner surface (typically uses lift char at  home)  Ambulation/Gait Ambulation/Gait assistance: Min assist Ambulation Distance (Feet): 75 Feet Assistive device: Rolling walker (2 wheeled)       General Gait Details: broad BOS with R LE ER noted; forward flexed posture with downward gaze.  Decreased cadence, gait speed; decreased ability to respond to external perturbations.  Additional gait distance limited by fatigue.  Stairs            Wheelchair Mobility    Modified Rankin (Stroke Patients Only)       Balance Overall balance assessment: Needs assistance Sitting-balance support: No upper extremity supported;Feet supported Sitting balance-Leahy Scale: Good     Standing balance support: Bilateral upper extremity supported Standing balance-Leahy Scale: Fair                               Pertinent Vitals/Pain Pain Assessment: No/denies pain    Home Living Family/patient expects to be discharged to:: Private residence Living Arrangements: Children Available Help at Discharge: Available 24 hours/day Type of Home: House Home Access: Level entry     Home Layout: One level Home Equipment: Environmental consultant - 4 wheels;Shower seat      Prior Function Level of Independence: Independent with assistive device(s)         Comments: Patient ambulates short distances in and around the house with his son nearby. They go to the patio frequently.  Does endorse two significant falls in previous year (sustaining L shoudler and R hip injury) with several 'near misses' since     Hand Dominance   Dominant Hand: Left    Extremity/Trunk Assessment   Upper Extremity Assessment: Overall  WFL for tasks assessed           Lower Extremity Assessment: Overall WFL for tasks assessed (grossly at least 4/5 throughout)         Communication   Communication: HOH  Cognition Arousal/Alertness: Awake/alert Behavior During Therapy: WFL for tasks assessed/performed Overall Cognitive Status: Within Functional Limits for  tasks assessed                      General Comments      Exercises        Assessment/Plan    PT Assessment Patient needs continued PT services  PT Diagnosis Difficulty walking;Generalized weakness   PT Problem List Decreased strength;Decreased activity tolerance;Decreased balance;Decreased mobility;Decreased knowledge of use of DME;Decreased safety awareness;Decreased knowledge of precautions;Cardiopulmonary status limiting activity  PT Treatment Interventions Gait training;DME instruction;Functional mobility training;Therapeutic activities;Therapeutic exercise;Balance training;Patient/family education   PT Goals (Current goals can be found in the Care Plan section) Acute Rehab PT Goals Patient Stated Goal: "to go back home" PT Goal Formulation: With patient/family Time For Goal Achievement: 08/30/15 Potential to Achieve Goals: Good    Frequency Min 2X/week   Barriers to discharge        Co-evaluation               End of Session Equipment Utilized During Treatment: Gait belt;Oxygen Activity Tolerance: Patient tolerated treatment well Patient left: in chair;with call bell/phone within reach;with chair alarm set;with family/visitor present Nurse Communication: Mobility status         Time: [id number] PT Time Calculation (min) (ACUTE ONLY): 24 min   Charges:   PT Evaluation $PT Eval Moderate Complexity: 1 Procedure     PT G Codes:       Mcclain Shall H. Owens Shark, PT, DPT, NCS 08/16/2015, 12:03 PM [phone]

## 2015-08-16 NOTE — Progress Notes (Signed)
Subjective:  Patient known to [place] from outpatient Admitted for lower abdominal pain Found to have UTI Feels better after treatment and after BM today   Objective:  Vital signs in last 24 hours:  Temp:  [98.5 F (36.9 C)-98.6 F (37 C)] 98.5 F (36.9 C) (01/09 0758) Pulse Rate:  [42-142] 61 (01/09 0759) Resp:  [11-26] 18 (01/09 0758) BP: (112-153)/(47-95) 122/47 mmHg (01/09 0758) SpO2:  [93 %-100 %] 99 % (01/09 0759) Weight:  [81.194 kg (179 lb)-84.188 kg (185 lb 9.6 oz)] 84.188 kg (185 lb 9.6 oz) (01/08 2110)  Weight change:  Filed Weights   08/15/15 1412 08/15/15 2110  Weight: 81.194 kg (179 lb) 84.188 kg (185 lb 9.6 oz)    Intake/Output:    Intake/Output Summary (Last 24 hours) at 08/16/15 1052 Last data filed at 08/16/15 1016  Gross per 24 hour  Intake    123 ml  Output    451 ml  Net   -328 ml     Physical Exam: General: NAD, sitting up in chair  HEENT anicteric  Neck supple  Pulm/lungs Normal effort, decreased breath sounds at bases, mild left basilar crackles  CVS/Heart Prominent crescendo systolic murmur  Abdomen:  Soft, NT, non distended  Extremities: Trace to 1+ peripheral edema  Neurologic: Alert, non focal   Skin: No acute rashes  Access:        Basic Metabolic Panel:   Recent Labs Lab 08/15/15 1407 08/16/15 0311  NA 137 138  K 4.2 4.0  CL 97* 98*  CO2 31 31  GLUCOSE 160* 127*  BUN 67* 70*  CREATININE 2.06* 1.87*  CALCIUM 8.9 8.4*     CBC:  Recent Labs Lab 08/15/15 1407 08/16/15 0311  WBC 10.6 12.1*  HGB 9.6* 9.0*  HCT 29.2* 26.7*  MCV 103.6* 102.7*  PLT 108* 91*      Microbiology:  Recent Results (from the past 720 hour(s))  Urine culture     Status: None (Preliminary result)   Collection Time: 08/15/15  2:07 PM  Result Value Ref Range Status   Specimen Description URINE, CLEAN CATCH  Final   Special Requests NONE  Final   Culture   Final    >=100,000 COLONIES/mL GRAM NEGATIVE RODS IDENTIFICATION AND  SUSCEPTIBILITIES TO FOLLOW    Report Status PENDING  Incomplete    Coagulation Studies:  Recent Labs  08/15/15 1407  LABPROT 17.7*  INR 1.45    Urinalysis:  Recent Labs  08/15/15 1407  COLORURINE YELLOW*  LABSPEC 1.011  PHURINE 5.0  GLUCOSEU NEGATIVE  HGBUR NEGATIVE  BILIRUBINUR NEGATIVE  KETONESUR NEGATIVE  PROTEINUR NEGATIVE  NITRITE NEGATIVE  LEUKOCYTESUR 3+*      Imaging: Ct Abdomen Pelvis Wo Contrast  08/15/2015  CLINICAL DATA:  Abdominal pain beginning last night. Diarrhea. Initial encounter. EXAM: CT ABDOMEN AND PELVIS WITHOUT CONTRAST TECHNIQUE: Multidetector CT imaging of the abdomen and pelvis was performed following the standard protocol without IV contrast. COMPARISON:  None. FINDINGS: Moderate to moderately large right pleural effusion is present. There is a small left pleural effusion. Cardiomegaly is noted. Calcific aortic and coronary atherosclerosis is seen. A small volume of abdominal and pelvic ascites is present. Gallstones are noted without evidence of cholecystitis. The liver, spleen, adrenal glands, pancreas and left kidney are unremarkable. A small cyst off the lower pole of the right kidney is noted. The patient has extensive aortoiliac atherosclerosis without aneurysm. Diverticulosis of the colon is worst in the sigmoid. The transverse colon is decompressed but  otherwise unremarkable. The stomach is also largely decompressed. Small bowel loops appear normal. Body wall edema is identified. No focal fluid collection is seen. Small fat containing inguinal hernias are identified. There is some ascitic fluid in the left hernia. No lytic or sclerotic bony lesion is seen. The patient is status post left hip replacement. IMPRESSION: Right greater than left pleural effusions, small to moderate volume of abdominal pelvic ascites and body wall edema and compatible with anasarca. Cause is not identified. Extensive atherosclerotic vascular disease without aneurysm. Mild  cardiomegaly. Gallstones without evidence of cholecystitis. Diverticulosis without diverticulitis. Electronically Signed   By: Inge Rise M.D.   On: 08/15/2015 18:16   Dg Chest 2 View  08/15/2015  CLINICAL DATA:  History of congestive heart area and pulmonary fusion. Fluid retention and abdominal pain. EXAM: CHEST  2 VIEW COMPARISON:  04/22/2015 FINDINGS: Dual lead cardiac pacemaker is stable. Postsurgical changes in the thorax are also stable. Cardiomediastinal silhouette is enlarged. Mediastinal contours appear intact. There is no evidence of focal airspace consolidation, or pneumothorax. There is persistent interstitial pulmonary edema, and layering right pleural effusion. There is a probable smaller left pleural effusion. Osseous structures are without acute abnormality. Soft tissues are grossly normal. IMPRESSION: Interstitial pulmonary edema. Moderate in size right pleural effusion. Probable small left pleural effusion. Enlarged cardiac silhouette. Electronically Signed   By: Fidela Salisbury M.D.   On: 08/15/2015 16:35     Medications:     . allopurinol  100 mg Oral QPM  . aspirin EC  81 mg Oral BH-q7a  . azelastine  2 spray Each Nare BID  . cholecalciferol  1,000 Units Oral BH-q7a  . Difluprednate  1 drop Left Eye BID  . enoxaparin (LOVENOX) injection  30 mg Subcutaneous QHS  . ferrous sulfate  325 mg Oral QPM  . furosemide  60 mg Intravenous Q12H  . insulin aspart  0-5 Units Subcutaneous QHS  . insulin aspart  0-9 Units Subcutaneous TID WC  . levofloxacin (LEVAQUIN) IV  250 mg Intravenous Q24H  . linagliptin  5 mg Oral Daily  . metolazone  2.5 mg Oral Daily  . pantoprazole  40 mg Oral QAC breakfast  . potassium chloride SA  20 mEq Oral BH-q7a  . sertraline  50 mg Oral QPM  . simvastatin  40 mg Oral QHS  . sodium chloride  3 mL Intravenous Q12H  . tamsulosin  0.4 mg Oral QHS  . vitamin B-12  1,000 mcg Oral BH-q7a   acetaminophen **OR** acetaminophen, albuterol, morphine  injection, oxyCODONE  Assessment/ Plan:  82 y.o. male with h/o Pacemaker placed December 03, 2008 after syncope from sick sinus syndrome. Anemia of chronic kidney disease intermittent atrial fibrillation - chronic Anticoagulation, congestive heart failure, Hypertension, Chronic kidney disease, Diabetes Gout, History of bladder cancer 1988 - operation. Follows Dr. Rogers Blocker, GERD, Glaucoma, Colonoscopy in 2003, Aortic heart valve replacement (tissue valve) Jan 09, Heart Cath jan 2010, surgery for neoplasm on the scalp was done at [facility] Admission [facility] 06/09/09-06/11/09 for proteus bactermia and ARF., Right knee replacement June 28, 2009 - redo after infected prosthesis removal left hip fracture - surgical repair by Dr. Marry Guan, Left arm fracture, MDS  1. ARF from CKD st 4 -  S Cr back to baseline now 2. UTI, GNR - treatment as per primary team - consider pyridium for symptomatic relief 3. B/l Pleural effusions - symptomatic control - no acute indication for tap    LOS: 1 Naquita Nappier 1/9/201710:52 AM

## 2015-08-16 NOTE — Plan of Care (Signed)
Problem: Safety: Goal: Ability to remain free from injury will improve Outcome: Progressing Fall precautions in place  Problem: Pain Managment: Goal: General experience of comfort will improve Outcome: Progressing Prn medications  Problem: Physical Regulation: Goal: Ability to maintain clinical measurements within normal limits will improve Outcome: Progressing Physical therapy evaluation today  Problem: Tissue Perfusion: Goal: Risk factors for ineffective tissue perfusion will decrease Outcome: Progressing SQ Lovenox  Problem: Activity: Goal: Risk for activity intolerance will decrease Outcome: Progressing Physical therapy evaluation  Problem: Fluid Volume: Goal: Ability to maintain a balanced intake and output will improve Outcome: Progressing ON I&O

## 2015-08-16 NOTE — Care Management (Signed)
[facility]  Last admission in Nov 2016- ws able to discharge directly back home and declined any home health services.  [name]  from [facility] has stated in the past that patient "usually requires short term stay in the skilled nursing"  but patient usually refuses.  One of his sons usually stays with him in the home.  He does have chronic home 02 in the home.  Physical therapy is recommending home health physical therapy.  Admitted with exacerbation of chf.  Would benefit from skilled nursing also if he will accept the service.

## 2015-08-16 NOTE — Plan of Care (Signed)
Problem: Education: Goal: Ability to demonstrate managment of disease process will improve Outcome: Progressing Handouts provided to patient at bedside

## 2015-08-17 ENCOUNTER — Inpatient Hospital Stay
Admit: 2015-08-17 | Discharge: 2015-08-17 | Disposition: A | Discharge status: Home / Self Care | Payer: Medicare PPO | Attending: Internal Medicine | Admitting: Internal Medicine

## 2015-08-17 LAB — BASIC METABOLIC PANEL
Anion gap: 12 (ref 5–15)
BUN: 76 mg/dL — AB (ref 6–20)
CALCIUM: 8.8 mg/dL — AB (ref 8.9–10.3)
CHLORIDE: 96 mmol/L — AB (ref 101–111)
CO2: 30 mmol/L (ref 22–32)
CREATININE: 2.3 mg/dL — AB (ref 0.61–1.24)
GFR calc non Af Amer: 23 mL/min — ABNORMAL LOW (ref >60)
GFR, EST AFRICAN AMERICAN: 27 mL/min — AB (ref >60)
Glucose, Bld: 131 mg/dL — ABNORMAL HIGH (ref 65–99)
Potassium: 4.2 mmol/L (ref 3.5–5.1)
SODIUM: 138 mmol/L (ref 135–145)

## 2015-08-17 LAB — GLUCOSE, CAPILLARY
GLUCOSE-CAPILLARY: 123 mg/dL — AB (ref 65–99)
GLUCOSE-CAPILLARY: 130 mg/dL — AB (ref 65–99)
Glucose-Capillary: 115 mg/dL — ABNORMAL HIGH (ref 65–99)
Glucose-Capillary: 120 mg/dL — ABNORMAL HIGH (ref 65–99)

## 2015-08-17 LAB — URINE CULTURE: Culture: >=100000

## 2015-08-17 MED ORDER — CEPHALEXIN 500 MG PO CAPS
500.0000 mg | ORAL_CAPSULE | Freq: Two times a day (BID) | ORAL | Status: DC
  Administered 2015-08-17 – 2015-08-18 (×2): 500 mg via ORAL
  Filled 2015-08-17 (×2): qty 1

## 2015-08-17 MED ORDER — GLUCERNA SHAKE PO LIQD
237.0000 mL | Freq: Three times a day (TID) | ORAL | Status: DC
  Administered 2015-08-18 (×2): 237 mL via ORAL

## 2015-08-17 NOTE — Plan of Care (Signed)
Problem: Activity: Goal: Risk for activity intolerance will decrease Outcome: Progressing Patient up in chair this am.

## 2015-08-17 NOTE — Progress Notes (Signed)
Initial Nutrition Assessment    INTERVENTION:   Meals and Snacks: Cater to patient preferences Medical Food Supplement Therapy: recommend addition of Glucerna Shake po TID, each supplement provides 220 kcal and 10 grams of protein   NUTRITION DIAGNOSIS:   Inadequate oral intake related to poor appetite as evidenced by per patient/family report, meal completion < 50%.   GOAL:   Patient will meet greater than or equal to 90% of their needs   MONITOR:    (Energy Intake, Anthropometrics, Digestive System, Electrolyte/Renal Profile, Glucose Profile)  REASON FOR ASSESSMENT:   Diagnosis    ASSESSMENT:    Pt admitted with abdominal pain, fluid retention with acute CHF, acute cystitis/UTI  Past Medical History  Diagnosis Date  . Arthritis   . Bladder cancer ([place])   . Diabetes ([place])   . Glaucoma   . Hypertension   . Sleep apnea   . Atrial fibrillation ([place])   . CHF (congestive heart failure) ([place])   . Atrial fibrillation ([place])   . Anemia   . Esophageal reflux   . Dyslipidemia   . LVF (left ventricular failure) ([place])   . CAD (coronary artery disease)   . Chronic kidney disease     Diet Order:  Diet 2 gram sodium Room service appropriate?: Yes; Fluid consistency:: Thin   Energy Intake: lunch meal tray untouched on visit today; pt reports he has only eaten ice cream today, requesting something to drink. Pt reports appetite is very poor at present, recorded po intake 28% of meals on average  Food and nutrition related history: pt reports loss of appetite began few days prior to admission; before this pt eating fairly ok, tries to eat 6 small meals per day, drinks Glucerna shakes  Electrolyte and Renal Profile:  Recent Labs Lab 08/15/15 1407 08/16/15 0311 08/17/15 1039  BUN 67* 70* 76*  CREATININE 2.06* 1.87* 2.30*  NA 137 138 138  K 4.2 4.0 4.2   Glucose Profile:   Recent Labs  08/17/15 0739 08/17/15 1107 08/17/15 1610  GLUCAP 123* 115* 130*   Meds: ss  novolog, lasix  Nutrition Focused Physical Exam:  Unable to complete Nutrition-Focused physical exam at this time. Pt not feeling well on visit today, deferred physical exam until follow-up   Height:   Ht Readings from Last 1 Encounters:  08/15/15 5' 9.5" (1.765 m)    Weight: pt reports UBW of 177 pounds  Wt Readings from Last 1 Encounters:  08/15/15 185 lb 9.6 oz (84.188 kg)    Wt Readings from Last 10 Encounters:  08/15/15 185 lb 9.6 oz (84.188 kg)  06/24/15 183 lb 1.5 oz (83.05 kg)  04/22/15 174 lb 11.2 oz (79.243 kg)  02/18/15 189 lb 13.1 oz (86.101 kg)    BMI:  Body mass index is 27.02 kg/(m^2).  Estimated Nutritional Needs:   Kcal:  [id number] kcals (BEE 1487, 1.3 AF, 1.0-1.2 IF)   Protein:  84-101 g (1.0-1.2 g/kg)   Fluid:  2100-2520 mL (25-30 ml/kg)   MODERATE Care Level  Kerman Passey MS, RD, LDN [phone] Pager  [phone] Weekend/On-Call Pager

## 2015-08-17 NOTE — Plan of Care (Signed)
Problem: Bowel/Gastric: Goal: Will not experience complications related to bowel motility Outcome: Progressing Patient had BM this am

## 2015-08-17 NOTE — Progress Notes (Signed)
*  PRELIMINARY RESULTS* Echocardiogram 2D Echocardiogram has been performed.  Laqueta Jean Hege 08/17/2015, 2:18 PM

## 2015-08-17 NOTE — Progress Notes (Signed)
[place] [facility] [place] NAME: [name]    MR#:  [id number]  DATE OF BIRTH:  [birth year]  SUBJECTIVE:  CHIEF COMPLAINT:   Chief Complaint  Patient presents with  . Abdominal Pain    Had fluid retention, Now responded to IV lasix, and have good response. On chronic oxygen. Have UTI.  REVIEW OF SYSTEMS:   CONSTITUTIONAL: No fever, positive for fatigue.  EYES: Left eye blurred vision followed with the eye doctor and workup for temporal arteritis underway  EARS, NOSE, AND THROAT: No tinnitus or ear pain. No sore throat. Positive for runny nose. Decreased hearing RESPIRATORY: Positive for cough and shortness of breath, no wheezing or hemoptysis.  CARDIOVASCULAR: No chest pain, orthopnea, edema.  GASTROINTESTINAL: Positive for nausea and abdominal pain. No blood in bowel movements. Occasional diarrhea GENITOURINARY: No dysuria, hematuria.  ENDOCRINE: No polyuria, nocturia,  HEMATOLOGY: No anemia, easy bruising or bleeding SKIN: No rash or lesion. MUSCULOSKELETAL: Positive for joint pains  NEUROLOGIC: No tingling, numbness, weakness.  PSYCHIATRY: No anxiety or depression.  ROS  DRUG ALLERGIES:   Allergies  Allergen Reactions  . Lidocaine Other (See Comments)    Reaction:  Hypotension   . Penicillins Other (See Comments)    Reaction:  Swollen feet Has patient had a PCN reaction causing immediate rash, facial/tongue/throat swelling, SOB or lightheadedness with hypotension: Yes Has patient had a PCN reaction causing severe rash involving mucus membranes or skin necrosis: No Has patient had a PCN reaction that required hospitalization No Has patient had a PCN reaction occurring within the last 10 years: No If all of the above answers are "NO", then may proceed with Cephalosporin use.    VITALS:  Blood pressure 122/43, pulse 83, temperature 98.8 F (37.1 C), temperature source Oral, resp. rate 18, height 5' 9.5" (1.765 m),  weight 84.188 kg (185 lb 9.6 oz), SpO2 96 %.  PHYSICAL EXAMINATION:   GENERAL: 82 y.o.-year-old patient lying in the bed with no acute distress.  EYES: Pupils equal, round, reactive to light and accommodation. No scleral icterus. Extraocular muscles intact.  HEENT: Head atraumatic, normocephalic. Oropharynx and nasopharynx clear.  NECK: Supple, no jugular venous distention. No thyroid enlargement, no tenderness.  LUNGS: Decreased bases breath sounds bilaterally, no wheezing, positive rales bilateral bases, no rhonchi or crepitation. No use of accessory muscles of respiration.  CARDIOVASCULAR: S1, S2 bradycardic. 2/6 systolic murmur, no rubs, or gallops.  ABDOMEN: Soft, tender lower abdomen over the bladder, nondistended. Bowel sounds present. No organomegaly or mass.  EXTREMITIES: 2+ edema, no cyanosis, or clubbing.  NEUROLOGIC: Cranial nerves II through XII are intact. Muscle strength 5/5 in all extremities. Sensation intact. Gait not checked.  PSYCHIATRIC: The patient is alert and oriented x 3.  SKIN: No rash, lesion, or ulcer.  Physical Exam LABORATORY PANEL:   CBC  Recent Labs Lab 08/16/15 0311  WBC 12.1*  HGB 9.0*  HCT 26.7*  PLT 91*   ------------------------------------------------------------------------------------------------------------------  Chemistries   Recent Labs Lab 08/15/15 1407  08/17/15 1039  NA 137  < > 138  K 4.2  < > 4.2  CL 97*  < > 96*  CO2 31  < > 30  GLUCOSE 160*  < > 131*  BUN 67*  < > 76*  CREATININE 2.06*  < > 2.30*  CALCIUM 8.9  < > 8.8*  AST 18  --   --   ALT 12*  --   --   ALKPHOS 88  --   --  BILITOT 1.2  --   --   < > = values in this interval not displayed. ------------------------------------------------------------------------------------------------------------------  Cardiac Enzymes  Recent Labs Lab 08/15/15 2306 08/16/15 0311  TROPONINI 0.10* 0.10*    ------------------------------------------------------------------------------------------------------------------  RADIOLOGY:  No results found.  ASSESSMENT AND PLAN:   Active Problems:   CHF (congestive heart failure) ([place])  1. Acute CHF. Likely disastolic as per Echo from [facility] in 2015- EF was normal.   IV lasix,  Hold Coreg with junctional rhythm. Monitor on telemetry and get serial cardiac enzymes and check an echocardiogram 2. Lower abdominal pain. from acute cystitis/ UTI .  Bladder scan only showed 100-300 in the bladder.   Ur Cx followed- changed to keflex.  Started Flomax for BPH 3. Junctional rhythm on EKG- hold Coreg for right now monitor on telemetry 4. COPD on 5 L of oxygen at home 5. Chronic kidney disease- watch closely with diuresis.    Nephrology consult appreciated.     Slightly worse renal function.  6. Type 2 diabetes- continue oral medication and put on sliding scale 7. Elevated troponin- could be false positive with chronic kidney disease versus demand ischemia with CHF.   All the records are reviewed and case discussed with Care Management/Social Workerr. Management plans discussed with the patient, family and they are in agreement.  CODE STATUS: DNR  TOTAL TIME TAKING CARE OF THIS PATIENT: 40 minutes.   Need Home health on d/c.  POSSIBLE D/C IN 1-2 DAYS, DEPENDING ON CLINICAL CONDITION.   [name] M.D on 08/17/2015   Between 7am to 6pm - Pager - [phone]  After 6pm go to [email or web address] - password [email or web address] [facility]  Office  [phone]  CC: Primary care physician; [name], MD  Note: This dictation was prepared with Dragon dictation along with smaller phrase technology. Any transcriptional errors that result from this process are unintentional.

## 2015-08-17 NOTE — Progress Notes (Signed)
Subjective:    Admitted for lower abdominal pain Found to have UTI Feels better after treatment and after BM  S Cr slightly higher at 2.30   Objective:  Vital signs in last 24 hours:  Temp:  [97.9 F (36.6 C)-98.7 F (37.1 C)] 97.9 F (36.6 C) (01/10 1109) Pulse Rate:  [74-78] 76 (01/10 1109) Resp:  [20] 20 (01/10 1109) BP: (105-129)/(41-58) 114/41 mmHg (01/10 1109) SpO2:  [94 %-99 %] 98 % (01/10 1109)  Weight change:  Filed Weights   08/15/15 1412 08/15/15 2110  Weight: 81.194 kg (179 lb) 84.188 kg (185 lb 9.6 oz)    Intake/Output:    Intake/Output Summary (Last 24 hours) at 08/17/15 1241 Last data filed at 08/17/15 1032  Gross per 24 hour  Intake    411 ml  Output   1101 ml  Net   -690 ml     Physical Exam: General: NAD, laying in bed  HEENT anicteric  Neck supple  Pulm/lungs Normal effort, decreased breath sounds at bases, mild left basilar crackles  CVS/Heart Prominent crescendo systolic murmur, irregular  Abdomen:  Soft, NT, non distended  Extremities: Trace to 1+ peripheral edema  Neurologic: Alert, non focal   Skin: No acute rashes  Access:        Basic Metabolic Panel:   Recent Labs Lab 08/15/15 1407 08/16/15 0311 08/17/15 1039  NA 137 138 138  K 4.2 4.0 4.2  CL 97* 98* 96*  CO2 31 31 30   GLUCOSE 160* 127* 131*  BUN 67* 70* 76*  CREATININE 2.06* 1.87* 2.30*  CALCIUM 8.9 8.4* 8.8*     CBC:  Recent Labs Lab 08/15/15 1407 08/16/15 0311  WBC 10.6 12.1*  HGB 9.6* 9.0*  HCT 29.2* 26.7*  MCV 103.6* 102.7*  PLT 108* 91*      Microbiology:  Recent Results (from the past 720 hour(s))  Urine culture     Status: None   Collection Time: 08/15/15  2:07 PM  Result Value Ref Range Status   Specimen Description URINE, CLEAN CATCH  Final   Special Requests NONE  Final   Culture >=100,000 COLONIES/mL ESCHERICHIA COLI  Final   Report Status 08/17/2015 FINAL  Final   Organism ID, Bacteria ESCHERICHIA COLI  Final      Susceptibility    Escherichia coli - MIC*    AMPICILLIN >=32 RESISTANT Resistant     CEFAZOLIN <=4 SENSITIVE Sensitive     CEFTRIAXONE <=1 SENSITIVE Sensitive     CIPROFLOXACIN >=4 RESISTANT Resistant     GENTAMICIN 2 SENSITIVE Sensitive     IMIPENEM <=0.25 SENSITIVE Sensitive     NITROFURANTOIN <=16 SENSITIVE Sensitive     TRIMETH/SULFA <=20 SENSITIVE Sensitive     PIP/TAZO Value in next row Sensitive      SENSITIVE<=4    * >=100,000 COLONIES/mL ESCHERICHIA COLI    Coagulation Studies:  Recent Labs  08/15/15 1407  LABPROT 17.7*  INR 1.45    Urinalysis:  Recent Labs  08/15/15 1407  COLORURINE YELLOW*  LABSPEC 1.011  PHURINE 5.0  GLUCOSEU NEGATIVE  HGBUR NEGATIVE  BILIRUBINUR NEGATIVE  KETONESUR NEGATIVE  PROTEINUR NEGATIVE  NITRITE NEGATIVE  LEUKOCYTESUR 3+*      Imaging: Ct Abdomen Pelvis Wo Contrast  08/15/2015  CLINICAL DATA:  Abdominal pain beginning last night. Diarrhea. Initial encounter. EXAM: CT ABDOMEN AND PELVIS WITHOUT CONTRAST TECHNIQUE: Multidetector CT imaging of the abdomen and pelvis was performed following the standard protocol without IV contrast. COMPARISON:  None. FINDINGS: Moderate to  moderately large right pleural effusion is present. There is a small left pleural effusion. Cardiomegaly is noted. Calcific aortic and coronary atherosclerosis is seen. A small volume of abdominal and pelvic ascites is present. Gallstones are noted without evidence of cholecystitis. The liver, spleen, adrenal glands, pancreas and left kidney are unremarkable. A small cyst off the lower pole of the right kidney is noted. The patient has extensive aortoiliac atherosclerosis without aneurysm. Diverticulosis of the colon is worst in the sigmoid. The transverse colon is decompressed but otherwise unremarkable. The stomach is also largely decompressed. Small bowel loops appear normal. Body wall edema is identified. No focal fluid collection is seen. Small fat containing inguinal hernias are  identified. There is some ascitic fluid in the left hernia. No lytic or sclerotic bony lesion is seen. The patient is status post left hip replacement. IMPRESSION: Right greater than left pleural effusions, small to moderate volume of abdominal pelvic ascites and body wall edema and compatible with anasarca. Cause is not identified. Extensive atherosclerotic vascular disease without aneurysm. Mild cardiomegaly. Gallstones without evidence of cholecystitis. Diverticulosis without diverticulitis. Electronically Signed   By: Inge Rise M.D.   On: 08/15/2015 18:16   Dg Chest 2 View  08/15/2015  CLINICAL DATA:  History of congestive heart area and pulmonary fusion. Fluid retention and abdominal pain. EXAM: CHEST  2 VIEW COMPARISON:  04/22/2015 FINDINGS: Dual lead cardiac pacemaker is stable. Postsurgical changes in the thorax are also stable. Cardiomediastinal silhouette is enlarged. Mediastinal contours appear intact. There is no evidence of focal airspace consolidation, or pneumothorax. There is persistent interstitial pulmonary edema, and layering right pleural effusion. There is a probable smaller left pleural effusion. Osseous structures are without acute abnormality. Soft tissues are grossly normal. IMPRESSION: Interstitial pulmonary edema. Moderate in size right pleural effusion. Probable small left pleural effusion. Enlarged cardiac silhouette. Electronically Signed   By: Fidela Salisbury M.D.   On: 08/15/2015 16:35     Medications:     . allopurinol  100 mg Oral QPM  . aspirin EC  81 mg Oral BH-q7a  . azelastine  2 spray Each Nare BID  . cholecalciferol  1,000 Units Oral BH-q7a  . Difluprednate  1 drop Left Eye BID  . enoxaparin (LOVENOX) injection  30 mg Subcutaneous QHS  . ferrous sulfate  325 mg Oral QPM  . furosemide  60 mg Intravenous Q12H  . insulin aspart  0-5 Units Subcutaneous QHS  . insulin aspart  0-9 Units Subcutaneous TID WC  . levofloxacin (LEVAQUIN) IV  250 mg  Intravenous Q24H  . linagliptin  5 mg Oral Daily  . metolazone  2.5 mg Oral Daily  . pantoprazole  40 mg Oral QAC breakfast  . potassium chloride SA  20 mEq Oral BH-q7a  . sertraline  50 mg Oral QPM  . simvastatin  40 mg Oral QHS  . sodium chloride  3 mL Intravenous Q12H  . tamsulosin  0.4 mg Oral QHS  . vitamin B-12  1,000 mcg Oral BH-q7a   acetaminophen **OR** acetaminophen, albuterol, morphine injection, oxyCODONE  Assessment/ Plan:  82 y.o. male with h/o Pacemaker placed December 03, 2008 after syncope from sick sinus syndrome. Anemia of chronic kidney disease intermittent atrial fibrillation - chronic Anticoagulation, congestive heart failure, Hypertension, Chronic kidney disease, Diabetes Gout, History of bladder cancer 1988 - operation. Follows Dr. Rogers Blocker, GERD, Glaucoma, Colonoscopy in 2003, Aortic heart valve replacement (tissue valve) Jan 09, Heart Cath jan 2010, surgery for neoplasm on the scalp was done at  [facility]., Admission [facility] 06/09/09-06/11/09 for proteus bactermia and ARF., Right knee replacement June 28, 2009 - redo after infected prosthesis removal left hip fracture - surgical repair by Dr. Marry Guan, Left arm fracture, MDS  1. ARF from CKD st 4 -  S Cr close to baseline  - encourage Po intake. Patient appears intravascularly dry. D/c iv lasix - reports poor appetite 2. UTI, E coli - treatment as per primary team - consider pyridium for symptomatic relief 3. B/l Pleural effusions - symptomatic control - no acute indication for tap    LOS: 2 Undra Trembath 1/10/201712:41 PM

## 2015-08-17 NOTE — Progress Notes (Signed)
Physical Therapy Treatment Patient Details Name: [name] MRN: [id number] DOB: 1920/03/25 Today's Date: 08/17/2015    History of Present Illness presented to ER secondary to abdominal pain; admitted with UTI, CHF.    PT Comments    Patient progressing well with mobility goals and demonstrates increased tolerance for ambulation and endurance in this session. He continues to require assistance with bed mobility and sit to stand transfers, though this appears to be returning to his baseline. He demonstrates ER during ambulation on RLE and requires cuing for proper RW use. O2 sats on 3L found to be between 87-91% during ambulation. Patient would continue to benefit from skilled PT services to increase his dynamic balance and tolerance for mobility.  Follow Up Recommendations  Home health PT;Supervision/Assistance - 24 hour     Equipment Recommendations       Recommendations for Other Services       Precautions / Restrictions Precautions Precautions: Fall Restrictions Weight Bearing Restrictions: No    Mobility  Bed Mobility Overal bed mobility: Needs Assistance Bed Mobility: Supine to Sit     Supine to sit: Min guard;Min assist     General bed mobility comments: Patient able to use UEs to assist with trunk and bring LEs off the EOB, though mild assistance required for bed mobility.   Transfers Overall transfer level: Needs assistance Equipment used: Rolling walker (2 wheeled) Transfers: Sit to/from Stand Sit to Stand: Mod assist         General transfer comment: Prolonged time to complete, mild loss of balance posteriorly initially able to correct with assistance.   Ambulation/Gait Ambulation/Gait assistance: Min guard Ambulation Distance (Feet): 150 Feet Assistive device: Rolling walker (2 wheeled)     Gait velocity interpretation: Below normal speed for age/gender General Gait Details: Patient ambulates with RW with R LE ER, flexed posture and decreased step  length bilaterally. Able to respond to cuing for RW positioning (closer to [facility]).    Stairs            Wheelchair Mobility    Modified Rankin (Stroke Patients Only)       Balance Overall balance assessment: Needs assistance Sitting-balance support: No upper extremity supported;Feet supported Sitting balance-Leahy Scale: Good     Standing balance support: Bilateral upper extremity supported Standing balance-Leahy Scale: Fair                      Cognition Arousal/Alertness: Awake/alert Behavior During Therapy: WFL for tasks assessed/performed Overall Cognitive Status: Within Functional Limits for tasks assessed                      Exercises General Exercises - Lower Extremity Ankle Circles/Pumps: AROM;10 reps;Both Long Arc Quad: AROM;Both;5 reps Heel Slides: AROM;Both;5 reps Straight Leg Raises: AROM;Both;10 reps    General Comments        Pertinent Vitals/Pain Pain Assessment: No/denies pain    Home Living                      Prior Function            PT Goals (current goals can now be found in the care plan section) Acute Rehab PT Goals Patient Stated Goal: "to go back home" PT Goal Formulation: With patient/family Time For Goal Achievement: 08/30/15 Potential to Achieve Goals: Good Progress towards PT goals: Progressing toward goals    Frequency  Min 2X/week    PT Plan Current plan remains  appropriate    Co-evaluation             End of Session Equipment Utilized During Treatment: Gait belt;Oxygen Activity Tolerance: Patient tolerated treatment well Patient left: in bed;with bed alarm set;with family/visitor present;with call bell/phone within reach     Time: 1113-1140 PT Time Calculation (min) (ACUTE ONLY): 27 min  Charges:  $Gait Training: 23-37 mins                    G Codes:      Kerman Passey, PT, DPT    08/17/2015, 2:09 PM

## 2015-08-17 NOTE — Care Management Important Message (Signed)
Important Message  Patient Details  Name: [name] MRN: [id number] Date of Birth: [birth year]   Medicare Important Message Given:  Yes    [name] 08/17/2015, 10:44 AM

## 2015-08-17 NOTE — Progress Notes (Signed)
[facility] [place] NAME: [name]    MR#:  [id number]  DATE OF BIRTH:  03-14-1920  SUBJECTIVE:  CHIEF COMPLAINT:   Chief Complaint  Patient presents with  . Abdominal Pain    Had fluid retention, Now responded to IV lasix, and have good response.  REVIEW OF SYSTEMS:   CONSTITUTIONAL: No fever, positive for fatigue.  EYES: Left eye blurred vision followed with the eye doctor and workup for temporal arteritis underway  EARS, NOSE, AND THROAT: No tinnitus or ear pain. No sore throat. Positive for runny nose. Decreased hearing RESPIRATORY: Positive for cough and shortness of breath, no wheezing or hemoptysis.  CARDIOVASCULAR: No chest pain, orthopnea, edema.  GASTROINTESTINAL: Positive for nausea and abdominal pain. No blood in bowel movements. Occasional diarrhea GENITOURINARY: No dysuria, hematuria.  ENDOCRINE: No polyuria, nocturia,  HEMATOLOGY: No anemia, easy bruising or bleeding SKIN: No rash or lesion. MUSCULOSKELETAL: Positive for joint pains  NEUROLOGIC: No tingling, numbness, weakness.  PSYCHIATRY: No anxiety or depression.  ROS  DRUG ALLERGIES:   Allergies  Allergen Reactions  . Lidocaine Other (See Comments)    Reaction:  Hypotension   . Penicillins Other (See Comments)    Reaction:  Swollen feet Has patient had a PCN reaction causing immediate rash, facial/tongue/throat swelling, SOB or lightheadedness with hypotension: Yes Has patient had a PCN reaction causing severe rash involving mucus membranes or skin necrosis: No Has patient had a PCN reaction that required hospitalization No Has patient had a PCN reaction occurring within the last 10 years: No If all of the above answers are "NO", then may proceed with Cephalosporin use.    VITALS:  Blood pressure 118/52, pulse 75, temperature 98.1 F (36.7 C), temperature source Oral, resp. rate 20, height 5' 9.5" (1.765 m), weight 84.188 kg (185 lb 9.6  oz), SpO2 96 %.  PHYSICAL EXAMINATION:   GENERAL: 82 y.o.-year-old patient lying in the bed with no acute distress.  EYES: Pupils equal, round, reactive to light and accommodation. No scleral icterus. Extraocular muscles intact.  HEENT: Head atraumatic, normocephalic. Oropharynx and nasopharynx clear.  NECK: Supple, no jugular venous distention. No thyroid enlargement, no tenderness.  LUNGS: Decreased bases breath sounds bilaterally, no wheezing, positive rales bilateral bases, no rhonchi or crepitation. No use of accessory muscles of respiration.  CARDIOVASCULAR: S1, S2 bradycardic. 2/6 systolic murmur, no rubs, or gallops.  ABDOMEN: Soft, tender lower abdomen over the bladder, nondistended. Bowel sounds present. No organomegaly or mass.  EXTREMITIES: 2+ edema, no cyanosis, or clubbing.  NEUROLOGIC: Cranial nerves II through XII are intact. Muscle strength 5/5 in all extremities. Sensation intact. Gait not checked.  PSYCHIATRIC: The patient is alert and oriented x 3.  SKIN: No rash, lesion, or ulcer.  Physical Exam LABORATORY PANEL:   CBC  Recent Labs Lab 08/16/15 0311  WBC 12.1*  HGB 9.0*  HCT 26.7*  PLT 91*   ------------------------------------------------------------------------------------------------------------------  Chemistries   Recent Labs Lab 08/15/15 1407 08/16/15 0311  NA 137 138  K 4.2 4.0  CL 97* 98*  CO2 31 31  GLUCOSE 160* 127*  BUN 67* 70*  CREATININE 2.06* 1.87*  CALCIUM 8.9 8.4*  AST 18  --   ALT 12*  --   ALKPHOS 88  --   BILITOT 1.2  --    ------------------------------------------------------------------------------------------------------------------  Cardiac Enzymes  Recent Labs Lab 08/15/15 2306 08/16/15 0311  TROPONINI 0.10* 0.10*   ------------------------------------------------------------------------------------------------------------------  RADIOLOGY:  Ct Abdomen Pelvis Wo  Contrast  08/15/2015  CLINICAL DATA:   Abdominal pain beginning last night. Diarrhea. Initial encounter. EXAM: CT ABDOMEN AND PELVIS WITHOUT CONTRAST TECHNIQUE: Multidetector CT imaging of the abdomen and pelvis was performed following the standard protocol without IV contrast. COMPARISON:  None. FINDINGS: Moderate to moderately large right pleural effusion is present. There is a small left pleural effusion. Cardiomegaly is noted. Calcific aortic and coronary atherosclerosis is seen. A small volume of abdominal and pelvic ascites is present. Gallstones are noted without evidence of cholecystitis. The liver, spleen, adrenal glands, pancreas and left kidney are unremarkable. A small cyst off the lower pole of the right kidney is noted. The patient has extensive aortoiliac atherosclerosis without aneurysm. Diverticulosis of the colon is worst in the sigmoid. The transverse colon is decompressed but otherwise unremarkable. The stomach is also largely decompressed. Small bowel loops appear normal. Body wall edema is identified. No focal fluid collection is seen. Small fat containing inguinal hernias are identified. There is some ascitic fluid in the left hernia. No lytic or sclerotic bony lesion is seen. The patient is status post left hip replacement. IMPRESSION: Right greater than left pleural effusions, small to moderate volume of abdominal pelvic ascites and body wall edema and compatible with anasarca. Cause is not identified. Extensive atherosclerotic vascular disease without aneurysm. Mild cardiomegaly. Gallstones without evidence of cholecystitis. Diverticulosis without diverticulitis. Electronically Signed   By: Inge Rise M.D.   On: 08/15/2015 18:16   Dg Chest 2 View  08/15/2015  CLINICAL DATA:  History of congestive heart area and pulmonary fusion. Fluid retention and abdominal pain. EXAM: CHEST  2 VIEW COMPARISON:  04/22/2015 FINDINGS: Dual lead cardiac pacemaker is stable. Postsurgical changes in the thorax are also stable.  Cardiomediastinal silhouette is enlarged. Mediastinal contours appear intact. There is no evidence of focal airspace consolidation, or pneumothorax. There is persistent interstitial pulmonary edema, and layering right pleural effusion. There is a probable smaller left pleural effusion. Osseous structures are without acute abnormality. Soft tissues are grossly normal. IMPRESSION: Interstitial pulmonary edema. Moderate in size right pleural effusion. Probable small left pleural effusion. Enlarged cardiac silhouette. Electronically Signed   By: Fidela Salisbury M.D.   On: 08/15/2015 16:35    ASSESSMENT AND PLAN:   Active Problems:   CHF (congestive heart failure) ([place])  1. Acute CHF. Likely disastolic as per Echo from [place] in 2015- EF was normal.   IV lasix,  Hold Coreg with junctional rhythm. Monitor on telemetry and get serial cardiac enzymes and check an echocardiogram 2. Lower abdominal pain. from acute cystitis/ UTI .  Bladder scan only showed 100-300 in the bladder.   Continue Levaquin. Follow-up urine culture. Start Flomax for BPH 3. Junctional rhythm on EKG- hold Coreg for right now monitor on telemetry 4. COPD on 5 L of oxygen at home 5. Chronic kidney disease- watch closely with diuresis.    Nephrology consult appreciated.   6. Type 2 diabetes- continue oral medication and put on sliding scale 7. Elevated troponin- could be false positive with chronic kidney disease versus demand ischemia with CHF.   All the records are reviewed and case discussed with Care Management/Social Workerr. Management plans discussed with the patient, family and they are in agreement.  CODE STATUS: DNR  TOTAL TIME TAKING CARE OF THIS PATIENT: 40 minutes.   Great grandson present in room.  POSSIBLE D/C IN 1-2 DAYS, DEPENDING ON CLINICAL CONDITION.   [name] M.D on 08/17/2015   Between 7am to 6pm - Pager -  [phone]  After 6pm go to [email or web address] - password [email or web address] [facility]  Office  [phone]  CC: Primary care physician; [name], MD  Note: This dictation was prepared with Dragon dictation along with smaller phrase technology. Any transcriptional errors that result from this process are unintentional.

## 2015-08-18 LAB — GLUCOSE, CAPILLARY
GLUCOSE-CAPILLARY: 134 mg/dL — AB (ref 65–99)
Glucose-Capillary: 168 mg/dL — ABNORMAL HIGH (ref 65–99)

## 2015-08-18 LAB — BASIC METABOLIC PANEL
Anion gap: 12 (ref 5–15)
BUN: 88 mg/dL — ABNORMAL HIGH (ref 6–20)
CALCIUM: 8.7 mg/dL — AB (ref 8.9–10.3)
CO2: 29 mmol/L (ref 22–32)
Chloride: 97 mmol/L — ABNORMAL LOW (ref 101–111)
Creatinine, Ser: 2.54 mg/dL — ABNORMAL HIGH (ref 0.61–1.24)
GFR calc Af Amer: 24 mL/min — ABNORMAL LOW (ref >60)
GFR, EST NON AFRICAN AMERICAN: 20 mL/min — AB (ref >60)
GLUCOSE: 168 mg/dL — AB (ref 65–99)
Potassium: 4 mmol/L (ref 3.5–5.1)
Sodium: 138 mmol/L (ref 135–145)

## 2015-08-18 MED ORDER — FUROSEMIDE 40 MG PO TABS: 40.0000 mg | ORAL_TABLET | Freq: Every day | ORAL | Status: AC | PRN

## 2015-08-18 MED ORDER — GLUCERNA SHAKE PO LIQD: 237.0000 mL | Freq: Three times a day (TID) | ORAL | Status: AC

## 2015-08-18 MED ORDER — CEPHALEXIN 500 MG PO CAPS: 500.0000 mg | ORAL_CAPSULE | Freq: Two times a day (BID) | ORAL | Status: AC

## 2015-08-18 MED ORDER — TAMSULOSIN HCL 0.4 MG PO CAPS: 0.4000 mg | ORAL_CAPSULE | Freq: Every day | ORAL | Status: AC

## 2015-08-18 NOTE — Progress Notes (Signed)
Removed telemetry and removed PIV.  Family at bedside.  Report to [facility].  No complaints of pain.  Escorted out of hospital via EMS by ambulance.    3LNC - chronic.

## 2015-08-18 NOTE — NC FL2 (Signed)
[place] LEVEL OF CARE SCREENING TOOL     IDENTIFICATION  Patient Name: [name] Birthdate: 1920-07-04 Sex: male Admission Date (Current Location): 08/15/2015  [place] and [place] Number:  Engineering geologist and Address:  [facility], [address], [place], [place] [address]      Provider Number: [id number]  Attending Physician Name and Address:  [name], MD  Relative Name and Phone Number:       Current Level of Care: SNF Recommended Level of Care: [place] Prior Approval Number:    Date Approved/Denied:   PASRR Number:  ([id number])  Discharge Plan: SNF    Current Diagnoses: Patient Active Problem List   Diagnosis Date Noted  . CHF (congestive heart failure) ([place]) 08/15/2015  . Pleural effusion 04/19/2015  . Thrombocytopenia ([place]) 02/18/2015    Orientation RESPIRATION BLADDER Height & Weight    Time, Situation, Place  O2 (Nasal Cannula (3 L/min)) Continent 5' 9.5" (176.5 cm) 185 lbs.  BEHAVIORAL SYMPTOMS/MOOD NEUROLOGICAL BOWEL NUTRITION STATUS   (None)  (None) Continent Diet (2 gram sodium )  AMBULATORY STATUS COMMUNICATION OF NEEDS Skin   Limited Assist Verbally Other (Comment) (Wound incision open or dehisced Left heel )                       Personal Care Assistance Level of Assistance  Bathing, Feeding, Dressing Bathing Assistance: Limited assistance Feeding assistance: Independent Dressing Assistance: Limited assistance     Functional Limitations Info  Sight, Hearing, Speech Sight Info: Adequate Hearing Info: Impaired (Patient has hearing impairment in both ears) Speech Info: Adequate    SPECIAL CARE FACTORS FREQUENCY  PT (By licensed PT)     PT Frequency:  (5)              Contractures      Additional Factors Info  Code Status, Allergies Code Status Info:  (DNR) Allergies Info:  (Penicillins &Lidocaine)           Current Medications  (08/18/2015):  This is the current hospital active medication list Current Facility-Administered Medications  Medication Dose Route Frequency Provider Last Rate Last Dose  . acetaminophen (TYLENOL) tablet 650 mg  650 mg Oral Q6H PRN Loletha Grayer, MD       Or  . acetaminophen (TYLENOL) suppository 650 mg  650 mg Rectal Q6H PRN Loletha Grayer, MD      . albuterol (PROVENTIL) (2.5 MG/3ML) 0.083% nebulizer solution 3 mL  3 mL Inhalation Q4H PRN Loletha Grayer, MD      . allopurinol (ZYLOPRIM) tablet 100 mg  100 mg Oral QPM Loletha Grayer, MD   100 mg at 08/17/15 1728  . aspirin EC tablet 81 mg  81 mg Oral Delsa Bern, MD   81 mg at 08/18/15 0842  . azelastine (ASTELIN) 0.1 % nasal spray 2 spray  2 spray Each Nare BID Loletha Grayer, MD   2 spray at 08/18/15 0843  . cephALEXin (KEFLEX) capsule 500 mg  500 mg Oral Q12H [name], MD   500 mg at 08/18/15 0842  . cholecalciferol (VITAMIN D) tablet 1,000 Units  1,000 Units Oral Delsa Bern, MD   1,000 Units at 08/18/15 [phone]  . Difluprednate 0.05 % EMUL 1 drop  1 drop Left Eye BID Loletha Grayer, MD   1 drop at 08/18/15 0843  . enoxaparin (LOVENOX) injection 30 mg  30 mg Subcutaneous QHS Loletha Grayer, MD   30 mg  at 08/17/15 2138  . feeding supplement (GLUCERNA SHAKE) (GLUCERNA SHAKE) liquid 237 mL  237 mL Oral TID WC [name], MD   237 mL at 08/18/15 0800  . ferrous sulfate tablet 325 mg  325 mg Oral QPM Loletha Grayer, MD   325 mg at 08/17/15 1728  . insulin aspart (novoLOG) injection 0-5 Units  0-5 Units Subcutaneous QHS Loletha Grayer, MD   0 Units at 08/15/15 2245  . insulin aspart (novoLOG) injection 0-9 Units  0-9 Units Subcutaneous TID WC Loletha Grayer, MD   1 Units at 08/18/15 [phone]  . linagliptin (TRADJENTA) tablet 5 mg  5 mg Oral Daily Loletha Grayer, MD   5 mg at 08/18/15 0842  . metolazone (ZAROXOLYN) tablet 2.5 mg  2.5 mg Oral Daily Loletha Grayer, MD   2.5 mg at 08/18/15 0842  .  pantoprazole (PROTONIX) EC tablet 40 mg  40 mg Oral QAC breakfast Loletha Grayer, MD   40 mg at 08/18/15 0843  . sertraline (ZOLOFT) tablet 50 mg  50 mg Oral QPM Loletha Grayer, MD   50 mg at 08/17/15 1728  . simvastatin (ZOCOR) tablet 40 mg  40 mg Oral QHS Loletha Grayer, MD   40 mg at 08/17/15 2138  . sodium chloride 0.9 % injection 3 mL  3 mL Intravenous Q12H Loletha Grayer, MD   3 mL at 08/18/15 0844  . tamsulosin (FLOMAX) capsule 0.4 mg  0.4 mg Oral QHS Loletha Grayer, MD   0.4 mg at 08/17/15 2138  . vitamin B-12 (CYANOCOBALAMIN) tablet 1,000 mcg  1,000 mcg Oral Delsa Bern, MD   1,000 mcg at 08/18/15 [id number]     Discharge Medications: Please see discharge summary for a list of discharge medications.  Relevant Imaging Results:  Relevant Lab Results:   Additional Information  (SSN: [id number])  Lorenso Quarry [place], LCSW

## 2015-08-18 NOTE — Clinical Social Work Note (Signed)
Clinical Social Work Assessment  Patient Details  Name: [name] MRN: [id number] Date of Birth: 16-Sep-1919  Date of referral:  08/18/15               Reason for consult:  Discharge Planning                Permission sought to share information with:  Facility Sport and exercise psychologist, Family Supports ([facility], Cosmo Tetreault (son) and Chukwudi Ewen (daughter-in-law) ) Permission granted to share information::  Yes, Verbal Permission Granted  Name::        Agency::   ([facility] )  Relationship::   Nyeem Stoke (son) and Ahmadou Bolz (daughter-in-law) )  Contact Information:   [phone] and [phone]  Housing/Transportation Living arrangements for the past 2 months:  [place] ([place] ) Source of Information:  Adult Children, Facility ([place], [place] Schroader (son) and Eileen Croswell (daughter-in-law) ) Patient Interpreter Needed:  None Criminal Activity/Legal Involvement Pertinent to Current Situation/Hospitalization:  No - Comment as needed Significant Relationships:  Adult Children Lives with:  Adult Children (Gevon Markus (son) and Aquarius Latouche (daughter-in-law)  live with patient in his villa at [place] ) Do you feel safe going back to the place where you live?  Yes Need for family participation in patient care:  Yes (Comment) [name] [place] (son) and Zekiah Caruth (daughter-in-law) )  Care giving concerns: Patient is from [place]. He has agreed with his family to participate in [place] at [facility].    Social Worker assessment / plan:  CSW was alerted by Consulting civil engineer, Nann that patient and his family are interested in [place] placement for patient at [facility] and they can accept patient. CSW spoke with Seth Bake, admissions coordinator at [facility] to verify that she can accept patient pending [employer] auth. Per Seth Bake she reports [facility] will accept patient pending [employer] auth. CSW faxed via [place]  information necessary to begin [facility]. Seth Bake reports that she can take patient today. She informed CSW that she will call back with room# and report #.   CSW met with patient, Esther Broyles (son) and Layken Doenges (daughter-in-law) at bedside. Patient was in bed. Per [name] patient is a resident at [facility]. He reports that recently he and his wife Daneil Dan moved in with patient to care for him. Per [name] patient has agreed to STR to assist with him "getting back on his feet." CSW explained that patient and his family that [facility] would accept patient pending [employer] auth. CSW explained that Seth Bake at [facility] is currently working on [employer].  CSW explained that patient would discharge to [facility] using respite days (3 in total); once respite days expired if [facility] approves, patient will not have to pay for STR; if [employer] declines STR for patient, patient and his family will have to pay for STR out of pocket (ranging from $7,000- $8,000 a month) or patient can return to his private Villa ([place]) with a personal care aid. Patient and his family reported they understood and that they've been in contact with Seth Bake, admissions coordinator at [facility]. Patient's son requested patient be transported by EMS.   CSW made RN aware of above. FL2 & PASRR completed and faxed to [facility]. CSW is awaiting room # and report #.   Employment status:  Retired Forensic scientist:  Engineer, materials )  PT Recommendations:  24 Hour Supervision, Home with [place] / Referral to community resources:  [place] [facility] )  Patient/Family's Response to care:  Patient and his family are agreeable for patient to go to [facility] at discharge.   Patient/Family's Understanding of and Emotional Response to Diagnosis, Current Treatment, and Prognosis:  Patient and his family were pleasant. They reported they  understood the process and felt "confident" in [facility] approval.   Emotional Assessment Appearance:  Appears stated age Attitude/Demeanor/Rapport:   (None ) Affect (typically observed):  Calm, Pleasant Orientation:  Oriented to Self, Oriented to Place, Oriented to  Time Alcohol / Substance use:  Not Applicable Psych involvement (Current and /or in the community):  No (Comment)  Discharge Needs  Concerns to be addressed:  Discharge Planning Concerns Readmission within the last 30 days:  No Current discharge risk:  Chronically ill Barriers to Discharge:  Continued Medical Work up   [employer], [place] 08/18/2015, 12:04 PM

## 2015-08-18 NOTE — Progress Notes (Signed)
Clinical Social Worker informed that patient will be medically ready to discharge to [facility]. Patient and his family [name] and [name]) are in a agreement with plan. CSW called [facility] to confirm that patient's bed is ready. Provided patient's room number 230 and number to call for report [phone] . All discharge information faxed to [facility] via [place].    CSW informed RN. RN will call report and patient will discharge to [place]  via [facility].  Ernest Pine, MSW, [place] Social Work Department [phone]

## 2015-08-18 NOTE — Clinical Social Work Placement (Signed)
   CLINICAL SOCIAL WORK PLACEMENT  NOTE  Date:  08/18/2015  Patient Details  Name: [name] MRN: [id number] Date of Birth: 1919-08-12  Clinical Social Work is seeking post-discharge placement for this patient at the [place] level of care (*CSW will initial, date and re-position this form in  chart as items are completed):  No (Patient is from [place] )   Patient/family provided with [place] Work Department's list of facilities offering this level of care within the geographic area requested by the patient (or if unable, by the patient's family).  No   Patient/family informed of their freedom to choose among providers that offer the needed level of care, that participate in Medicare, Medicaid or managed care program needed by the patient, have an available bed and are willing to accept the patient.  No   Patient/family informed of [facility]'s ownership interest in [facility] and [facility], as well as of the fact that they are under no obligation to receive care at these facilities.  PASRR submitted to [employer] on       PASRR number received on       Existing PASRR number confirmed on 08/18/15     FL2 transmitted to all facilities in geographic area requested by pt/family on 08/18/15 (Patient is from [place] and was faxed to [facility] for a STR bed. )     FL2 transmitted to all facilities within larger geographic area on       Patient informed that his/her managed care company has contracts with or will negotiate with certain facilities, including the following:        Yes   Patient/family informed of bed offers received.  Patient chooses bed at  [facility] )     Physician recommends and patient chooses bed at      Patient to be transferred to  [facility] ) on 08/18/15.  Patient to be transferred to facility by  [facility])     Patient family notified on 08/18/15 of  transfer.  Name of family member notified:   [name] and Epifania Gore)     PHYSICIAN       Additional Comment:    _______________________________________________ Baldemar Lenis, LCSW 08/18/2015, 12:21 PM

## 2015-08-18 NOTE — Progress Notes (Signed)
Subjective:    Admitted for lower abdominal pain Found to have UTI Serum creatinine slightly higher today Otherwise feels better Able to take ensure   Objective:  Vital signs in last 24 hours:  Temp:  [98.1 F (36.7 C)-98.8 F (37.1 C)] 98.1 F (36.7 C) (01/11 1113) Pulse Rate:  [78-83] 79 (01/11 1113) Resp:  [18] 18 (01/11 1113) BP: (121-122)/(43-46) 121/43 mmHg (01/11 1113) SpO2:  [96 %-97 %] 97 % (01/11 1113)  Weight change:  Filed Weights   08/15/15 1412 08/15/15 2110  Weight: 81.194 kg (179 lb) 84.188 kg (185 lb 9.6 oz)    Intake/Output:    Intake/Output Summary (Last 24 hours) at 08/18/15 1725 Last data filed at 08/18/15 0902  Gross per 24 hour  Intake    240 ml  Output    650 ml  Net   -410 ml     Physical Exam: General: NAD, laying in bed  HEENT anicteric  Neck supple  Pulm/lungs Normal effort, decreased breath sounds at bases, mild left basilar crackles  CVS/Heart Prominent crescendo systolic murmur, irregular  Abdomen:  Soft, NT, non distended  Extremities: Trace to 1+ peripheral edema  Neurologic: Alert, non focal   Skin: No acute rashes  Access:        Basic Metabolic Panel:   Recent Labs Lab 08/15/15 1407 08/16/15 0311 08/17/15 1039 08/18/15 1011  NA 137 138 138 138  K 4.2 4.0 4.2 4.0  CL 97* 98* 96* 97*  CO2 31 31 30 29   GLUCOSE 160* 127* 131* 168*  BUN 67* 70* 76* 88*  CREATININE 2.06* 1.87* 2.30* 2.54*  CALCIUM 8.9 8.4* 8.8* 8.7*     CBC:  Recent Labs Lab 08/15/15 1407 08/16/15 0311  WBC 10.6 12.1*  HGB 9.6* 9.0*  HCT 29.2* 26.7*  MCV 103.6* 102.7*  PLT 108* 91*      Microbiology:  Recent Results (from the past 720 hour(s))  Urine culture     Status: None   Collection Time: 08/15/15  2:07 PM  Result Value Ref Range Status   Specimen Description URINE, CLEAN CATCH  Final   Special Requests NONE  Final   Culture >=100,000 COLONIES/mL ESCHERICHIA COLI  Final   Report Status 08/17/2015 FINAL  Final   Organism ID, Bacteria ESCHERICHIA COLI  Final      Susceptibility   Escherichia coli - MIC*    AMPICILLIN >=32 RESISTANT Resistant     CEFAZOLIN <=4 SENSITIVE Sensitive     CEFTRIAXONE <=1 SENSITIVE Sensitive     CIPROFLOXACIN >=4 RESISTANT Resistant     GENTAMICIN 2 SENSITIVE Sensitive     IMIPENEM <=0.25 SENSITIVE Sensitive     NITROFURANTOIN <=16 SENSITIVE Sensitive     TRIMETH/SULFA <=20 SENSITIVE Sensitive     PIP/TAZO Value in next row Sensitive      SENSITIVE<=4    * >=100,000 COLONIES/mL ESCHERICHIA COLI    Coagulation Studies: No results for input(s): LABPROT, INR in the last 72 hours.  Urinalysis: No results for input(s): COLORURINE, LABSPEC, PHURINE, GLUCOSEU, HGBUR, BILIRUBINUR, KETONESUR, PROTEINUR, UROBILINOGEN, NITRITE, LEUKOCYTESUR in the last 72 hours.  Invalid input(s): APPERANCEUR    Imaging: No results found.   Medications:       Assessment/ Plan:  82 y.o. male with h/o Pacemaker placed December 03, 2008 after syncope from sick sinus syndrome. Anemia of chronic kidney disease intermittent atrial fibrillation - chronic Anticoagulation, congestive heart failure, Hypertension, Chronic kidney disease, Diabetes Gout, History of bladder cancer 1988 - operation. Follows  Dr. Rogers Blocker, GERD, Glaucoma, Colonoscopy in 2003, Aortic heart valve replacement (tissue valve) Jan 09, Heart Cath jan 2010, surgery for neoplasm on the scalp was done at [facility] Admission [facility] 06/09/09-06/11/09 for proteus bactermia and ARF., Right knee replacement June 28, 2009 - redo after infected prosthesis removal left hip fracture - surgical repair by Dr. Marry Guan, Left arm fracture, MDS  1. ARF from CKD st 4 -  Patient has tenuous volume status due to severe aortic stenosis - Restart low-dose Lasix only when weight goes up 188 pounds or greater  2. UTI, E coli - treatment as per primary team - consider pyridium for symptomatic relief  3. B/l Pleural effusions - symptomatic control - no  acute indication for tap  followup as outpatient   LOS: 3 Tila Millirons 1/11/20175:25 PM

## 2015-08-18 NOTE — Care Management (Signed)
patient's family are insisting that he discharge to [facility] skilled nursing unit. They having been living with patient in his independent living unit but presently feel he needs closer medically monitoring before discharging back to his home.  IT is hoped that he will be able to ultimately return to his home.  CSW is involved

## 2015-08-18 NOTE — Discharge Instructions (Signed)
Heart Failure Clinic appointment on September 08, 2015 at 10:00am with Darylene Price, [place]. Please call [phone] to rescheduled.  Fluid restriction up to 1200 ml daily Low salt diet. Daily weigh your self, if > 188 Lb - take lasix 40 mg one times a day for 2 days- and still not able to get rid of extra weight- call your doctor or cardiologist.

## 2015-08-18 NOTE — Progress Notes (Signed)
Initial appointment made at the [facility] on September 08, 2015 at 10:00am. Thank you.

## 2015-08-18 NOTE — Discharge Summary (Signed)
[facility] NAME: [name]    MR#:  [id number]  DATE OF BIRTH:  Sep 11, 1919  DATE OF ADMISSION:  08/15/2015 ADMITTING PHYSICIAN: William Grayer, MD  DATE OF DISCHARGE: 08/18/2015  PRIMARY CARE PHYSICIAN: William Pink, MD    ADMISSION DIAGNOSIS:  Acute cystitis without hematuria [N30.00]  DISCHARGE DIAGNOSIS:  Active Problems:   CHF (congestive heart failure) (HCC)   CKD.  SECONDARY DIAGNOSIS:   Past Medical History  Diagnosis Date  . Arthritis   . Bladder cancer ([place])   . Diabetes ([place])   . Glaucoma   . Hypertension   . Sleep apnea   . Atrial fibrillation ([place])   . CHF (congestive heart failure) ([place])   . Atrial fibrillation ([place])   . Anemia   . Esophageal reflux   . Dyslipidemia   . LVF (left ventricular failure) ([place])   . CAD (coronary artery disease)   . Chronic kidney disease     HOSPITAL COURSE:   1. Acute CHF. Likely disastolic as per Echo from [place] in 2015- EF was normal.  IV lasix, Hold Coreg with junctional rhythm. Monitor on telemetry and get serial cardiac enzymes and check an echocardiogram.   Advised to check bodyweight daily and take lasix , if needed. 2. Lower abdominal pain. from acute cystitis/ UTI . Bladder scan only showed 100-300 in the bladder.  Ur Cx followed- changed to keflex. Started Flomax for BPH   Give keflex for 4 more days. 3. Junctional rhythm on EKG- hold Coreg for right now monitor on telemetry- stable. 4. COPD on 5 L of oxygen at home- cont. 5. Chronic kidney disease- watch closely with diuresis.  Nephrology consult appreciated.   Slightly worse renal function.    As per nephrologist- this is fine with him getting discharge- and follow i office next week. 6. Type 2 diabetes- continue oral medication and put on sliding scale 7. Elevated troponin- could be false positive with chronic kidney disease versus demand ischemia with CHF.  DISCHARGE  CONDITIONS:   Stable.  CONSULTS OBTAINED:  Treatment Team:  William Grayer, MD  DRUG ALLERGIES:   Allergies  Allergen Reactions  . Lidocaine Other (See Comments)    Reaction:  Hypotension   . Penicillins Other (See Comments)    Reaction:  Swollen feet Has patient had a PCN reaction causing immediate rash, facial/tongue/throat swelling, SOB or lightheadedness with hypotension: Yes Has patient had a PCN reaction causing severe rash involving mucus membranes or skin necrosis: No Has patient had a PCN reaction that required hospitalization No Has patient had a PCN reaction occurring within the last 10 years: No If all of the above answers are "NO", then may proceed with Cephalosporin use.    DISCHARGE MEDICATIONS:   Current Discharge Medication List    START taking these medications   Details  cephALEXin (KEFLEX) 500 MG capsule Take 1 capsule (500 mg total) by mouth every 12 (twelve) hours. Qty: 8 capsule, Refills: 0    feeding supplement, GLUCERNA SHAKE, (GLUCERNA SHAKE) LIQD Take 237 mLs by mouth 3 (three) times daily with meals. Qty: 10 Can, Refills: 0    tamsulosin (FLOMAX) 0.4 MG CAPS capsule Take 1 capsule (0.4 mg total) by mouth at bedtime. Qty: 30 capsule, Refills: 0      CONTINUE these medications which have CHANGED   Details  furosemide (LASIX) 40 MG tablet Take 1 tablet (40 mg total) by mouth daily as needed for fluid or  edema. Baseline weight is around 184 Lb, so check weight daily- if You weigh more than 188- take 40 mg tablet that day, if you have to take if 3 consicutive days- contact your PMD or cardiologist. Otho Darner: 60 tablet, Refills: 0      CONTINUE these medications which have NOT CHANGED   Details  allopurinol (ZYLOPRIM) 100 MG tablet Take 100 mg by mouth every evening.     aspirin EC 81 MG tablet Take 81 mg by mouth every morning.     azelastine (ASTELIN) 0.1 % nasal spray Place 2 sprays into both nostrils 2 (two) times daily. Use in each nostril as  directed    cholecalciferol (VITAMIN D) 1000 UNITS tablet Take 1,000 Units by mouth every morning.     DUREZOL 0.05 % EMUL Place 1 drop into the left eye 2 (two) times daily. Refills: 0    ferrous sulfate 325 (65 FE) MG tablet Take 325 mg by mouth every evening.     metolazone (ZAROXOLYN) 2.5 MG tablet Take 2.5 mg by mouth daily as needed (for edema). *Based on weight- 177lbs take this medication and 174lbs don't take this medication*    omeprazole (PRILOSEC) 20 MG capsule Take 20 mg by mouth every morning.     PROAIR HFA 108 (90 Base) MCG/ACT inhaler Inhale 2 puffs into the lungs every 4 (four) hours as needed. For wheezing or shortness of breath. Refills: 0    sertraline (ZOLOFT) 50 MG tablet Take 50 mg by mouth every evening.     simvastatin (ZOCOR) 40 MG tablet Take 40 mg by mouth at bedtime.    sitaGLIPtin (JANUVIA) 50 MG tablet Take 50 mg by mouth every evening.     vitamin B-12 (CYANOCOBALAMIN) 1000 MCG tablet Take 1,000 mcg by mouth every morning.       STOP taking these medications     carvedilol (COREG) 12.5 MG tablet      potassium chloride SA (K-DUR,KLOR-CON) 20 MEQ tablet          DISCHARGE INSTRUCTIONS:    Follow with Nephrology clinic in 1 week.  If you experience worsening of your admission symptoms, develop shortness of breath, life threatening emergency, suicidal or homicidal thoughts you must seek medical attention immediately by calling 911 or calling your MD immediately  if symptoms less severe.  You Must read complete instructions/literature along with all the possible adverse reactions/side effects for all the Medicines you take and that have been prescribed to you. Take any new Medicines after you have completely understood and accept all the possible adverse reactions/side effects.   Please note  You were cared for by a hospitalist during your hospital stay. If you have any questions about your discharge medications or the care you received while  you were in the hospital after you are discharged, you can call the unit and asked to speak with the hospitalist on call if the hospitalist that took care of you is not available. Once you are discharged, your primary care physician will handle any further medical issues. Please note that NO REFILLS for any discharge medications will be authorized once you are discharged, as it is imperative that you return to your primary care physician (or establish a relationship with a primary care physician if you do not have one) for your aftercare needs so that they can reassess your need for medications and monitor your lab values.    Today   CHIEF COMPLAINT:   Chief Complaint  Patient presents with  .  Abdominal Pain    HISTORY OF PRESENT ILLNESS:  [name]  is a 82 y.o. male with a known history of CHF, COPD on chronic oxygen and chronic kidney disease. He presents with abdominal pain 8 out of 10 intensity that comes and goes and eases off a little bit. 8 out of 10 intensity and now down to 5 out of 10 intensity. A bowel movement helps and it's worse with moving around. In the ER he had a CT scan of the abdomen and pelvis that showed some ascites but otherwise negative. He was found to have a urinary infection. Because of his diffuse tenderness in the abdomen hospitalist services were contacted for further evaluation. Patient always Has shortness of breath and has been gaining weight and some fluid recently. His legs have become more swollen. As per the family he has been urinating less recently.  VITAL SIGNS:  Blood pressure 121/43, pulse 79, temperature 98.1 F (36.7 C), temperature source Oral, resp. rate 18, height 5' 9.5" (1.765 m), weight 84.188 kg (185 lb 9.6 oz), SpO2 97 %.  I/O:   Intake/Output Summary (Last 24 hours) at 08/18/15 1155 Last data filed at 08/18/15 0902  Gross per 24 hour  Intake    240 ml  Output    800 ml  Net   -560 ml    PHYSICAL EXAMINATION:   GENERAL: 82  y.o.-year-old patient lying in the bed with no acute distress.  EYES: Pupils equal, round, reactive to light and accommodation. No scleral icterus. Extraocular muscles intact.  HEENT: Head atraumatic, normocephalic. Oropharynx and nasopharynx clear.  NECK: Supple, no jugular venous distention. No thyroid enlargement, no tenderness.  LUNGS: Decreased bases breath sounds bilaterally, no wheezing, positive rales bilateral bases, no rhonchi or crepitation. No use of accessory muscles of respiration.  CARDIOVASCULAR: S1, S2 bradycardic. 2/6 systolic murmur, no rubs, or gallops.  ABDOMEN: Soft, tender lower abdomen over the bladder, nondistended. Bowel sounds present. No organomegaly or mass.  EXTREMITIES: 2+ edema, no cyanosis, or clubbing.  NEUROLOGIC: Cranial nerves II through XII are intact. Muscle strength 5/5 in all extremities. Sensation intact. Gait not checked.  PSYCHIATRIC: The patient is alert and oriented x 3.  SKIN: No rash, lesion, or ulcer.   DATA REVIEW:   CBC  Recent Labs Lab 08/16/15 0311  WBC 12.1*  HGB 9.0*  HCT 26.7*  PLT 91*    Chemistries   Recent Labs Lab 08/15/15 1407  08/18/15 1011  NA 137  < > 138  K 4.2  < > 4.0  CL 97*  < > 97*  CO2 31  < > 29  GLUCOSE 160*  < > 168*  BUN 67*  < > 88*  CREATININE 2.06*  < > 2.54*  CALCIUM 8.9  < > 8.7*  AST 18  --   --   ALT 12*  --   --   ALKPHOS 88  --   --   BILITOT 1.2  --   --   < > = values in this interval not displayed.  Cardiac Enzymes  Recent Labs Lab 08/16/15 0311  TROPONINI 0.10*    Microbiology Results  Results for orders placed or performed during the hospital encounter of 08/15/15  Urine culture     Status: None   Collection Time: 08/15/15  2:07 PM  Result Value Ref Range Status   Specimen Description URINE, CLEAN CATCH  Final   Special Requests NONE  Final   Culture >=100,000 COLONIES/mL ESCHERICHIA COLI  Final   Report Status 08/17/2015 FINAL  Final   Organism ID,  Bacteria ESCHERICHIA COLI  Final      Susceptibility   Escherichia coli - MIC*    AMPICILLIN >=32 RESISTANT Resistant     CEFAZOLIN <=4 SENSITIVE Sensitive     CEFTRIAXONE <=1 SENSITIVE Sensitive     CIPROFLOXACIN >=4 RESISTANT Resistant     GENTAMICIN 2 SENSITIVE Sensitive     IMIPENEM <=0.25 SENSITIVE Sensitive     NITROFURANTOIN <=16 SENSITIVE Sensitive     TRIMETH/SULFA <=20 SENSITIVE Sensitive     PIP/TAZO Value in next row Sensitive      SENSITIVE<=4    * >=100,000 COLONIES/mL ESCHERICHIA COLI    RADIOLOGY:  No results found.    Management plans discussed with the patient, family and they are in agreement.  CODE STATUS:     Code Status Orders        Start     Ordered   08/15/15 1908  Do not attempt resuscitation (DNR)   Continuous    Question Answer Comment  In the event of cardiac or respiratory ARREST Do not call a "code blue"   In the event of cardiac or respiratory ARREST Do not perform Intubation, CPR, defibrillation or ACLS   In the event of cardiac or respiratory ARREST Use medication by any route, position, wound care, and other measures to relive pain and suffering. May use oxygen, suction and manual treatment of airway obstruction as needed for comfort.   Comments nurse may pronounce      08/15/15 1908    Code Status History    Date Active Date Inactive Code Status Order ID Comments User Context   04/19/2015  3:14 PM 04/22/2015  2:40 PM DNR [id number]  Demetrios Loll, MD Inpatient   04/19/2015  2:54 PM 04/19/2015  3:14 PM Full Code [id number]  Demetrios Loll, MD Inpatient    Advance Directive Documentation        Most Recent Value   Type of Advance Directive  Healthcare Power of Attorney   Pre-existing out of facility DNR order (yellow form or [facility] MOST form)     "MOST" Form in Place?        TOTAL TIME TAKING CARE OF THIS PATIENT: 35 minutes.    [name] M.D on 08/18/2015 at 11:55 AM  Between 7am to 6pm - Pager - [phone]  After 6pm go to  [email or web address] - password [email or web address] [facility]  Office  [phone]  CC: Primary care physician; William Pink, MD   Note: This dictation was prepared with Dragon dictation along with smaller phrase technology. Any transcriptional errors that result from this process are unintentional.

## 2015-08-22 ENCOUNTER — Emergency Department: Payer: Medicare PPO

## 2015-08-22 ENCOUNTER — Encounter: Payer: Self-pay | Admitting: Emergency Medicine

## 2015-08-22 ENCOUNTER — Inpatient Hospital Stay
Admission: EM | Admit: 2015-08-22 | Discharge: 2015-09-08 | DRG: 948 | Disposition: E | Payer: Medicare PPO | Attending: Internal Medicine | Admitting: Internal Medicine

## 2015-08-22 DIAGNOSIS — Z801 Family history of malignant neoplasm of trachea, bronchus and lung: Secondary | ICD-10-CM

## 2015-08-22 DIAGNOSIS — I5022 Chronic systolic (congestive) heart failure: Secondary | ICD-10-CM | POA: Diagnosis present

## 2015-08-22 DIAGNOSIS — N179 Acute kidney failure, unspecified: Secondary | ICD-10-CM

## 2015-08-22 DIAGNOSIS — K630 Abscess of intestine: Secondary | ICD-10-CM

## 2015-08-22 DIAGNOSIS — Z87891 Personal history of nicotine dependence: Secondary | ICD-10-CM | POA: Diagnosis not present

## 2015-08-22 DIAGNOSIS — M1990 Unspecified osteoarthritis, unspecified site: Secondary | ICD-10-CM | POA: Diagnosis present

## 2015-08-22 DIAGNOSIS — Z880 Allergy status to penicillin: Secondary | ICD-10-CM | POA: Diagnosis not present

## 2015-08-22 DIAGNOSIS — Z952 Presence of prosthetic heart valve: Secondary | ICD-10-CM | POA: Diagnosis not present

## 2015-08-22 DIAGNOSIS — Z7901 Long term (current) use of anticoagulants: Secondary | ICD-10-CM | POA: Diagnosis not present

## 2015-08-22 DIAGNOSIS — G4730 Sleep apnea, unspecified: Secondary | ICD-10-CM | POA: Diagnosis present

## 2015-08-22 DIAGNOSIS — Z8551 Personal history of malignant neoplasm of bladder: Secondary | ICD-10-CM | POA: Diagnosis not present

## 2015-08-22 DIAGNOSIS — Z950 Presence of cardiac pacemaker: Secondary | ICD-10-CM

## 2015-08-22 DIAGNOSIS — Z888 Allergy status to other drugs, medicaments and biological substances status: Secondary | ICD-10-CM

## 2015-08-22 DIAGNOSIS — K651 Peritoneal abscess: Secondary | ICD-10-CM

## 2015-08-22 DIAGNOSIS — Z96651 Presence of right artificial knee joint: Secondary | ICD-10-CM | POA: Diagnosis present

## 2015-08-22 DIAGNOSIS — D631 Anemia in chronic kidney disease: Secondary | ICD-10-CM | POA: Diagnosis present

## 2015-08-22 DIAGNOSIS — I2510 Atherosclerotic heart disease of native coronary artery without angina pectoris: Secondary | ICD-10-CM | POA: Diagnosis present

## 2015-08-22 DIAGNOSIS — M109 Gout, unspecified: Secondary | ICD-10-CM | POA: Diagnosis present

## 2015-08-22 DIAGNOSIS — Z9981 Dependence on supplemental oxygen: Secondary | ICD-10-CM | POA: Diagnosis not present

## 2015-08-22 DIAGNOSIS — R188 Other ascites: Secondary | ICD-10-CM | POA: Diagnosis present

## 2015-08-22 DIAGNOSIS — R601 Generalized edema: Secondary | ICD-10-CM

## 2015-08-22 DIAGNOSIS — Z96642 Presence of left artificial hip joint: Secondary | ICD-10-CM | POA: Diagnosis present

## 2015-08-22 DIAGNOSIS — I130 Hypertensive heart and chronic kidney disease with heart failure and stage 1 through stage 4 chronic kidney disease, or unspecified chronic kidney disease: Secondary | ICD-10-CM | POA: Diagnosis present

## 2015-08-22 DIAGNOSIS — Z66 Do not resuscitate: Secondary | ICD-10-CM | POA: Diagnosis present

## 2015-08-22 DIAGNOSIS — B999 Unspecified infectious disease: Secondary | ICD-10-CM | POA: Diagnosis present

## 2015-08-22 DIAGNOSIS — N184 Chronic kidney disease, stage 4 (severe): Secondary | ICD-10-CM | POA: Diagnosis present

## 2015-08-22 DIAGNOSIS — Z7982 Long term (current) use of aspirin: Secondary | ICD-10-CM

## 2015-08-22 DIAGNOSIS — H409 Unspecified glaucoma: Secondary | ICD-10-CM | POA: Diagnosis present

## 2015-08-22 DIAGNOSIS — K219 Gastro-esophageal reflux disease without esophagitis: Secondary | ICD-10-CM | POA: Diagnosis present

## 2015-08-22 DIAGNOSIS — Z515 Encounter for palliative care: Secondary | ICD-10-CM | POA: Diagnosis present

## 2015-08-22 DIAGNOSIS — I4891 Unspecified atrial fibrillation: Secondary | ICD-10-CM | POA: Diagnosis present

## 2015-08-22 DIAGNOSIS — J9610 Chronic respiratory failure, unspecified whether with hypoxia or hypercapnia: Secondary | ICD-10-CM | POA: Diagnosis present

## 2015-08-22 DIAGNOSIS — Z8249 Family history of ischemic heart disease and other diseases of the circulatory system: Secondary | ICD-10-CM

## 2015-08-22 DIAGNOSIS — Z79899 Other long term (current) drug therapy: Secondary | ICD-10-CM

## 2015-08-22 DIAGNOSIS — I429 Cardiomyopathy, unspecified: Secondary | ICD-10-CM | POA: Diagnosis present

## 2015-08-22 DIAGNOSIS — R109 Unspecified abdominal pain: Secondary | ICD-10-CM | POA: Diagnosis present

## 2015-08-22 LAB — COMPREHENSIVE METABOLIC PANEL
ALK PHOS: 68 U/L (ref 38–126)
ALT: 17 U/L (ref 17–63)
AST: 34 U/L (ref 15–41)
Albumin: 2.2 g/dL — ABNORMAL LOW (ref 3.5–5.0)
Anion gap: 10 (ref 5–15)
BUN: 114 mg/dL — ABNORMAL HIGH (ref 6–20)
CALCIUM: 9 mg/dL (ref 8.9–10.3)
CO2: 31 mmol/L (ref 22–32)
CREATININE: 3.83 mg/dL — AB (ref 0.61–1.24)
Chloride: 96 mmol/L — ABNORMAL LOW (ref 101–111)
GFR, EST AFRICAN AMERICAN: 14 mL/min — AB (ref >60)
GFR, EST NON AFRICAN AMERICAN: 12 mL/min — AB (ref >60)
Glucose, Bld: 179 mg/dL — ABNORMAL HIGH (ref 65–99)
Potassium: 4.2 mmol/L (ref 3.5–5.1)
Sodium: 137 mmol/L (ref 135–145)
Total Bilirubin: 1.9 mg/dL — ABNORMAL HIGH (ref 0.3–1.2)
Total Protein: 6.4 g/dL — ABNORMAL LOW (ref 6.5–8.1)

## 2015-08-22 LAB — CBC WITH DIFFERENTIAL/PLATELET
Basophils Absolute: 0 10*3/uL (ref 0–0.1)
EOS ABS: 0.3 10*3/uL (ref 0–0.7)
Eosinophils Relative: 3 %
HCT: 29.1 % — ABNORMAL LOW (ref 40.0–52.0)
HEMOGLOBIN: 9.4 g/dL — AB (ref 13.0–18.0)
Lymphocytes Relative: 3 %
Lymphs Abs: 0.4 10*3/uL — ABNORMAL LOW (ref 1.0–3.6)
MCH: 32.6 pg (ref 26.0–34.0)
MCHC: 32.3 g/dL (ref 32.0–36.0)
MCV: 100.9 fL — ABNORMAL HIGH (ref 80.0–100.0)
Monocytes Absolute: 1.6 10*3/uL — ABNORMAL HIGH (ref 0.2–1.0)
Monocytes Relative: 14 %
Neutro Abs: 9.2 10*3/uL — ABNORMAL HIGH (ref 1.4–6.5)
Platelets: 91 10*3/uL — ABNORMAL LOW (ref 150–440)
RBC: 2.88 MIL/uL — AB (ref 4.40–5.90)
RDW: 15 % — ABNORMAL HIGH (ref 11.5–14.5)
WBC: 11.5 10*3/uL — AB (ref 3.8–10.6)

## 2015-08-22 LAB — URINALYSIS COMPLETE WITH MICROSCOPIC (ARMC ONLY)
BACTERIA UA: NONE SEEN
Bilirubin Urine: NEGATIVE
Glucose, UA: NEGATIVE mg/dL
Hgb urine dipstick: NEGATIVE
Ketones, ur: NEGATIVE mg/dL
LEUKOCYTES UA: NEGATIVE
NITRITE: NEGATIVE
Protein, ur: NEGATIVE mg/dL
SPECIFIC GRAVITY, URINE: 1.015 (ref 1.005–1.030)
pH: 5 (ref 5.0–8.0)

## 2015-08-22 LAB — LACTIC ACID, PLASMA
LACTIC ACID, VENOUS: 1 mmol/L (ref 0.5–2.0)
Lactic Acid, Venous: 0.8 mmol/L (ref 0.5–2.0)

## 2015-08-22 MED ORDER — ONDANSETRON HCL 4 MG/2ML IJ SOLN
4.0000 mg | Freq: Once | INTRAMUSCULAR | Status: AC
  Administered 2015-08-22: 4 mg via INTRAVENOUS
  Filled 2015-08-22: qty 2

## 2015-08-22 MED ORDER — SODIUM CHLORIDE 0.9 % IV SOLN
INTRAVENOUS | Status: DC
  Administered 2015-08-22 – 2015-08-23 (×2): via INTRAVENOUS

## 2015-08-22 MED ORDER — ALUM & MAG HYDROXIDE-SIMETH 200-200-20 MG/5ML PO SUSP: 30.0000 mL | Freq: Four times a day (QID) | ORAL | Status: DC | PRN

## 2015-08-22 MED ORDER — HYDROMORPHONE HCL 1 MG/ML IJ SOLN
1.0000 mg | INTRAMUSCULAR | Status: DC | PRN
  Administered 2015-08-23 (×2): 1 mg via INTRAVENOUS
  Filled 2015-08-22 (×2): qty 1

## 2015-08-22 MED ORDER — ACETAMINOPHEN 650 MG RE SUPP: 650.0000 mg | Freq: Four times a day (QID) | RECTAL | Status: DC | PRN

## 2015-08-22 MED ORDER — CIPROFLOXACIN IN D5W 400 MG/200ML IV SOLN
400.0000 mg | INTRAVENOUS | Status: DC
  Administered 2015-08-23: 400 mg via INTRAVENOUS
  Filled 2015-08-22 (×2): qty 200

## 2015-08-22 MED ORDER — FENTANYL CITRATE (PF) 100 MCG/2ML IJ SOLN
50.0000 ug | Freq: Once | INTRAMUSCULAR | Status: AC
  Administered 2015-08-22: 50 ug via INTRAVENOUS
  Filled 2015-08-22: qty 2

## 2015-08-22 MED ORDER — SODIUM CHLORIDE 0.9 % IV BOLUS (SEPSIS)
1000.0000 mL | Freq: Once | INTRAVENOUS | Status: AC
  Administered 2015-08-22: 1000 mL via INTRAVENOUS

## 2015-08-22 MED ORDER — IOHEXOL 240 MG/ML SOLN: 50.0000 mL | INTRAMUSCULAR | Status: AC

## 2015-08-22 MED ORDER — METRONIDAZOLE IN NACL 5-0.79 MG/ML-% IV SOLN
500.0000 mg | Freq: Three times a day (TID) | INTRAVENOUS | Status: DC
  Administered 2015-08-23 (×2): 500 mg via INTRAVENOUS
  Filled 2015-08-22 (×5): qty 100

## 2015-08-22 MED ORDER — ONDANSETRON HCL 4 MG/2ML IJ SOLN: 4.0000 mg | Freq: Four times a day (QID) | INTRAMUSCULAR | Status: DC | PRN

## 2015-08-22 MED ORDER — SODIUM CHLORIDE 0.9 % IV BOLUS (SEPSIS)
500.0000 mL | Freq: Once | INTRAVENOUS | Status: AC
  Administered 2015-08-22: 500 mL via INTRAVENOUS

## 2015-08-22 MED ORDER — CIPROFLOXACIN IN D5W 400 MG/200ML IV SOLN
400.0000 mg | Freq: Two times a day (BID) | INTRAVENOUS | Status: DC
  Filled 2015-08-22: qty 200

## 2015-08-22 MED ORDER — CIPROFLOXACIN IN D5W 400 MG/200ML IV SOLN
400.0000 mg | Freq: Once | INTRAVENOUS | Status: AC
  Administered 2015-08-22: 400 mg via INTRAVENOUS
  Filled 2015-08-22: qty 200

## 2015-08-22 MED ORDER — METRONIDAZOLE IN NACL 5-0.79 MG/ML-% IV SOLN
500.0000 mg | Freq: Once | INTRAVENOUS | Status: AC
  Administered 2015-08-22: 500 mg via INTRAVENOUS
  Filled 2015-08-22: qty 100

## 2015-08-22 MED ORDER — HEPARIN SODIUM (PORCINE) 5000 UNIT/ML IJ SOLN: 5000.0000 [IU] | Freq: Three times a day (TID) | INTRAMUSCULAR | Status: DC

## 2015-08-22 MED ORDER — MORPHINE SULFATE (PF) 2 MG/ML IV SOLN
2.0000 mg | Freq: Once | INTRAVENOUS | Status: AC
  Administered 2015-08-22: 2 mg via INTRAVENOUS
  Filled 2015-08-22: qty 1

## 2015-08-22 MED ORDER — ACETAMINOPHEN 325 MG PO TABS: 650.0000 mg | ORAL_TABLET | Freq: Four times a day (QID) | ORAL | Status: DC | PRN

## 2015-08-22 MED ORDER — BISACODYL 5 MG PO TBEC
5.0000 mg | DELAYED_RELEASE_TABLET | Freq: Every day | ORAL | Status: DC | PRN
  Administered 2015-08-22: 5 mg via ORAL
  Filled 2015-08-22: qty 1

## 2015-08-22 MED ORDER — HEPARIN SODIUM (PORCINE) 5000 UNIT/ML IJ SOLN
5000.0000 [IU] | Freq: Three times a day (TID) | INTRAMUSCULAR | Status: DC
  Administered 2015-08-22 – 2015-08-23 (×2): 5000 [IU] via SUBCUTANEOUS
  Filled 2015-08-22 (×2): qty 1

## 2015-08-22 NOTE — H&P (Signed)
[name] at [facility] NAME: [name]    MR#:  [id number]  DATE OF BIRTH:  1920/06/22  DATE OF ADMISSION:  08/26/2015  PRIMARY CARE PHYSICIAN: [name], MD   REQUESTING/REFERRING PHYSICIAN: Dr. Reita Cliche  CHIEF COMPLAINT:   Chief Complaint  Patient presents with  . Abdominal Pain    HISTORY OF PRESENT ILLNESS:  [name]  is a 82 y.o. male with a known history of hypertension, congestive heart failure, atrial fibrillation brought in by the family secondary to lower abdominal pain. Patient recently was treated treated for UTI. Still has abdominal pain, poor by mouth intake, nausea, vomiting. Patient also had low-grade temperature. Patient abdominal CAT scan showed pelvic abscess. But patient's family does not want surgery and wants to be admitted to medicine for IV antibiotics, IV pain medicine. Seen by Dr. Burt Knack from surgery. Complaint is right lower quadrant abdominal pain, nausea, vomiting and poor by mouth intake.  PAST MEDICAL HISTORY:   Past Medical History  Diagnosis Date  . Arthritis   . Bladder cancer ([place])   . Diabetes ([place])   . Glaucoma   . Hypertension   . Sleep apnea   . Atrial fibrillation ([place])   . CHF (congestive heart failure) ([place])   . Atrial fibrillation ([place])   . Anemia   . Esophageal reflux   . Dyslipidemia   . LVF (left ventricular failure) ([place])   . CAD (coronary artery disease)   . Chronic kidney disease     PAST SURGICAL HISTOIRY:   Past Surgical History  Procedure Laterality Date  . Knee arthroscopy    . Aortic valve replacement    . Left hip hemiarthroplasty      SOCIAL HISTORY:   Social History  Substance Use Topics  . Smoking status: Former Smoker    Types: Cigarettes  . Smokeless tobacco: Not on file  . Alcohol Use: Not on file    FAMILY HISTORY:   Family History  Problem Relation Age of Onset  . Heart attack Father   . Hypertension Father   . Lung cancer Sister      DRUG ALLERGIES:   Allergies  Allergen Reactions  . Lidocaine Other (See Comments)    Reaction:  Hypotension   . Penicillins Other (See Comments)    Reaction:  Swollen feet Has patient had a PCN reaction causing immediate rash, facial/tongue/throat swelling, SOB or lightheadedness with hypotension: Yes Has patient had a PCN reaction causing severe rash involving mucus membranes or skin necrosis: No Has patient had a PCN reaction that required hospitalization No Has patient had a PCN reaction occurring within the last 10 years: No If all of the above answers are "NO", then may proceed with Cephalosporin use.    REVIEW OF SYSTEMS:  CONSTITUTIONAL:  Has Generalized weakness. EYES: No blurred or double vision.  EARS, NOSE, AND THROAT: No tinnitus or ear pain.  RESPIRATORY: No cough, shortness of breath, wheezing or hemoptysis.  CARDIOVASCULAR: No chest pain, orthopnea, edema.  GASTROINTESTINAL: Nausea, vomiting, abdominal pain. Patient has poor by mouth intake. GENITOURINARY: No dysuria, hematuria.  ENDOCRINE: No polyuria, nocturia,  HEMATOLOGY: No anemia, easy bruising or bleeding SKIN: No rash or lesion. MUSCULOSKELETAL: No joint pain or arthritis.   NEUROLOGIC: No tingling, numbness, weakness.  PSYCHIATRY: No anxiety or depression.   MEDICATIONS AT HOME:   Prior to Admission medications   Medication Sig Start Date End Date Taking? Authorizing Provider  acetaminophen (TYLENOL 8 HOUR ARTHRITIS  PAIN) 650 MG CR tablet Take 650 mg by mouth every 4 (four) hours as needed for pain.   Yes Historical Provider, MD  allopurinol (ZYLOPRIM) 100 MG tablet Take 100 mg by mouth every evening.    Yes Historical Provider, MD  aspirin EC 81 MG tablet Take 81 mg by mouth every morning.    Yes Historical Provider, MD  azelastine (ASTELIN) 0.1 % nasal spray Place 2 sprays into both nostrils 2 (two) times daily. Use in each nostril as directed   Yes Historical Provider, MD  cephALEXin (KEFLEX) 500  MG capsule Take 1 capsule (500 mg total) by mouth every 12 (twelve) hours. 08/18/15  Yes [name], MD  cholecalciferol (VITAMIN D) 1000 UNITS tablet Take 1,000 Units by mouth every morning.    Yes Historical Provider, MD  DUREZOL 0.05 % EMUL Place 1 drop into the left eye 2 (two) times daily. 08/11/15  Yes Historical Provider, MD  feeding supplement, GLUCERNA SHAKE, (GLUCERNA SHAKE) LIQD Take 237 mLs by mouth 3 (three) times daily with meals. 08/18/15  Yes [name], MD  ferrous sulfate 325 (65 FE) MG tablet Take 325 mg by mouth every evening.    Yes Historical Provider, MD  furosemide (LASIX) 40 MG tablet Take 40 mg by mouth 2 (two) times daily. *hold if weight <170 lbs*   Yes Historical Provider, MD  metolazone (ZAROXOLYN) 2.5 MG tablet Take 2.5 mg by mouth daily as needed (for edema). *If weight > 185lbs*   Yes Historical Provider, MD  omeprazole (PRILOSEC) 20 MG capsule Take 20 mg by mouth every morning.    Yes Historical Provider, MD  PROAIR HFA 108 (90 Base) MCG/ACT inhaler Inhale 2 puffs into the lungs every 4 (four) hours as needed. For wheezing or shortness of breath. 07/02/15  Yes Historical Provider, MD  sertraline (ZOLOFT) 50 MG tablet Take 50 mg by mouth every evening.    Yes Historical Provider, MD  simvastatin (ZOCOR) 40 MG tablet Take 40 mg by mouth at bedtime.   Yes Historical Provider, MD  sitaGLIPtin (JANUVIA) 50 MG tablet Take 50 mg by mouth every evening.    Yes Historical Provider, MD  tamsulosin (FLOMAX) 0.4 MG CAPS capsule Take 1 capsule (0.4 mg total) by mouth at bedtime. 08/18/15  Yes [name], MD  vitamin B-12 (CYANOCOBALAMIN) 1000 MCG tablet Take 1,000 mcg by mouth every morning.    Yes Historical Provider, MD  furosemide (LASIX) 40 MG tablet Take 1 tablet (40 mg total) by mouth daily as needed for fluid or edema. Baseline weight is around 184 Lb, so check weight daily- if You weigh more than 188- take 40 mg tablet that day, if you have to  take if 3 consicutive days- contact your PMD or cardiologist. 08/18/15   [name], MD      VITAL SIGNS:  Blood pressure 113/43, pulse 70, temperature 98.7 F (37.1 C), temperature source Oral, resp. rate 20, height 6' (1.829 m), weight 80.74 kg (178 lb), SpO2 99 %.  PHYSICAL EXAMINATION:  GENERAL:  83 y.o.-year-old patient lying in the bed with no acute distress.  EYES: Pupils equal, round, reactive to light and accommodation. No scleral icterus. Extraocular muscles intact.  HEENT: Head atraumatic, normocephalic. Oropharynx and nasopharynx clear.  NECK:  Supple, no jugular venous distention. No thyroid enlargement, no tenderness.  LUNGS: decreased  breath sounds bilaterally., Bilateral basilar crepitations present. no ,rhonchi or crepitation. Not use of accessory muscles of respiration.  CARDIOVASCULAR: S1, S2 normal. No murmurs, rubs,  or gallops.  ABDOMEN: Generalized abdominal tenderness present ,no rebound tenderness. Bowel sounds present. No organomegaly or mass.  EXTREMITIES: Bilateral pedal edema present  NEUROLOGIC: Cranial nerves II through XII are intact. Muscle strength 5/5 in all extremities. Sensation intact. Gait not checked.  PSYCHIATRIC: The patient is alert and oriented x 3.  SKIN: Ecchymosis over the arms, legs.  LABORATORY PANEL:   CBC  Recent Labs Lab 08/29/2015 1009  WBC 11.5*  HGB 9.4*  HCT 29.1*  PLT 91*   ------------------------------------------------------------------------------------------------------------------  Chemistries   Recent Labs Lab 08/20/2015 1009  NA 137  K 4.2  CL 96*  CO2 31  GLUCOSE 179*  BUN 114*  CREATININE 3.83*  CALCIUM 9.0  AST 34  ALT 17  ALKPHOS 68  BILITOT 1.9*   ------------------------------------------------------------------------------------------------------------------  Cardiac Enzymes  Recent Labs Lab 08/16/15 0311  TROPONINI 0.10*    ------------------------------------------------------------------------------------------------------------------  RADIOLOGY:  Ct Abdomen Pelvis Wo Contrast  08/22/2015  CLINICAL DATA:  Progressive abdominal pain and distention EXAM: CT ABDOMEN AND PELVIS WITHOUT CONTRAST TECHNIQUE: Multidetector CT imaging of the abdomen and pelvis was performed following the standard protocol without oral or intravenous contrast maternal administration. COMPARISON:  August 15, 2015 FINDINGS: Lower chest: There is a moderate right pleural effusion with right base consolidation. There is a smaller left pleural effusion with left base atelectasis posteriorly. There is patchy atelectasis in both lung bases with bilateral lower lobe bronchiectatic change. Pacemaker leads are attached to the right atrium and right ventricle. There is extensive coronary artery calcification present. Hepatobiliary: There is a 1 cm probable cyst in the anterior segment right lobe liver near the fissure for the ligamentum teres. A small amount of vascular calcification is noted in the anterior aspect of the liver. No new liver lesions are identified. There is cholelithiasis in the gallbladder. The gallbladder wall does not appear appreciably thickened. No biliary duct dilatation. Pancreas: No mass or inflammatory focus. Spleen: No splenic lesions are identified beyond vascular calcification. Adrenals/Urinary Tract: Adrenals appear normal bilaterally. There is moderate renal sinus fat bilaterally. There is no renal mass or hydronephrosis on either side. No renal or ureteral calculus on either side. Urinary bladder is midline with wall thickness within normal limits. Stomach/bowel: There are multiple sigmoid diverticula without diverticulitis. No bowel wall or mesenteric thickening. There is no appreciable bowel obstruction. No portal venous air. Pneumoperitoneum is not appreciated, although there is extensive air within the ascitic fluid. No bowel  pneumatosis. Vascular/Lymphatic: There is extensive atherosclerotic calcification in the aorta. There is extensive calcification at the origin of the superior mesenteric and celiac arteries as well as the right renal artery. No abdominal aortic aneurysm. No adenopathy appreciable in the abdomen pelvis. Reproductive: Prostate and seminal vesicles appear grossly normal. No pelvic mass. Other: There is extensive ascitic fluid throughout the abdomen and pelvis. In the fluid in the anterior pelvis, there is extensive soft tissue air, likely infection. The appearance is consistent with developing abscess. The air-containing fluid collection in the pelvis measures 17.4 x 11.8 x 13.7 cm. There is no periappendiceal region inflammation. There is a left inguinal hernia which contains fluid. Bowel is not seen in this hernia. The appearance of this hernia is stable compared to recent study. There is also fat in the right inguinal ring. Musculoskeletal: Patient is status post left total hip replacement. There is arthropathy in the right hip joint. Bones are osteoporotic. There is degenerative change in the lumbar spine. There are no blastic or lytic bone lesions. There is  no intramuscular lesion or abdominal wall lesion. IMPRESSION: Large developing pelvic abscess measuring 17.4 x 11.8 x 13.7 cm. This fluid collection contains extensive air. There is also moderate ascites in the abdomen or pelvis. Left inguinal hernia containing fluid, stable. Cholelithiasis. Bilateral pleural effusions with bibasilar consolidation, larger on the right than on the left. Lower lobe scarring and bronchiectatic change bilaterally. Sigmoid diverticula without diverticulitis.  No bowel obstruction. No renal or ureteral calculus.  No hydronephrosis. Extensive atherosclerotic calcification aorta as well as in the proximal major mesenteric arteries and proximal right kidney. No bowel pneumatosis. No portal venous air. No frank pneumoperitoneum seen.  Critical Value/emergent results were called by telephone at the time of interpretation on 09/02/2015 at 3:51 pm to Dr. Lisa Roca , who verbally acknowledged these results. Electronically Signed   By: Lowella Grip III M.D.   On: 09/07/2015 15:52   Dg Abd Acute W/chest  08/31/2015  CLINICAL DATA:  Abdominal pain and distention. Shortness of breath for several days. EXAM: DG ABDOMEN ACUTE W/ 1V CHEST COMPARISON:  08/15/2015 FINDINGS: Multiple mildly dilated small bowel loops are seen containing air-fluid levels. There is a paucity of colonic gas. A partial or low-grade small bowel obstruction cannot be excluded. There is no evidence free air. Residual contrast is seen within the distal colon showing diverticulosis. Mild cardiomegaly stable. Pacemaker remains in place. Diffuse interstitial edema pattern and small to moderate right pleural effusion show no significant change. IMPRESSION: Multiple mildly dilated small bowel loops containing air-fluid levels, with paucity of colonic gas. A partial or low-grade small bowel obstruction cannot be excluded. Stable congestive heart failure with small to moderate right pleural effusion. Electronically Signed   By: Earle Gell M.D.   On: 09/05/2015 12:06    EKG:   Orders placed or performed during the hospital encounter of 08/12/2015  . EKG 12-Lead  . EKG 12-Lead    IMPRESSION AND PLAN:  #1 abdominal pain secondary to worsening ascites, right pleural effusion: Pelvic fluid collection suggestive of infected ascitic fluid rather than the pelvic abscess as per surgery.; Admit to medicine, start IV Cipro, Flagyl, IV pain medication. Dr. Burt Knack is following the patient , her possible need for CT-guided drainage. I am really does not want any surgical procedure,  wanted to see if he improves with IV antibiotics and pain medication.  Family wants to Discuss with Dr. Murlean Iba ,his nephrologist about his prognosis, comfort care options. #2. Acute on chronic renal  failure: Continue IV hydration. Hold Zaroxolyn, Lasix. Obtain nephrology consult.  3. Hypertension: Controlled. #4 diabetes mellitus type 2: Use for discussion with coverage only, patient is nothing by mouth because of abdominal pain. #5 chronic alcoholic heart failure: EF 45% by previous records. Chronic respiratory failure is as 4 L of oxygen at home. Prognosis is  poor. 5 CODE STATUS DO NOT RESUSCITATE.  All the records are reviewed and case discussed with ED provider. Management plans discussed with the patient, family and they are in agreement.  CODE STATUS:DNR TOTAL TIME TAKING CARE OF THIS PATIENT: 55 minutes.    Epifanio Lesches M.D on 08/31/2015 at 4:46 PM  Between 7am to 6pm - Pager - [phone]  After 6pm go to [email or web address] - password [email or web address]  Office  [phone]  CC: Primary care physician; [name], MD  Note: This dictation was prepared with Dragon dictation along with smaller phrase technology. Any transcriptional errors that result from this process are unintentional.

## 2015-08-22 NOTE — ED Notes (Signed)
Per EMS:  From [facility], for abdominal pain, was given milk of magnesium yesterday to facilitate bowel movement, had large BM last night, but this morning he is c/o worsening abdominal pain and difficulty breathing.  Patient is on 5 liters o2 nasal cannula at home, currently on 3 liters [place] and 100% spo2.  Recent uti admission.  VSS.  CBG 187, DNR.

## 2015-08-22 NOTE — ED Notes (Signed)
Report received from grace.

## 2015-08-22 NOTE — ED Notes (Signed)
Called AC at 1800

## 2015-08-22 NOTE — ED Notes (Signed)
Patient transported to CT 

## 2015-08-22 NOTE — ED Provider Notes (Signed)
[facility] Emergency Department Provider Note   ____________________________________________  Time seen:  I have reviewed the triage vital signs and the triage nursing note.  HISTORY  Chief Complaint Abdominal Pain   Historian Patient and 2 daughters  HPI [name] is a 82 y.o. male who is here complaining of diffuse abdominal pain, but mostly lower. This pain sounds like it started yesterday as much more severe, although he has had crampy abdominal pain for over a week. He was recently evaluated for abdominal pain and found to have a urinary tract infection and finished antibiotics yesterday for that. Patient and family states his abdominal pain was much more severe yesterday that has been previously. No fever. He did have nausea with 1 episode of nonbloody nonbilious emesis this morning. Large bowel movement was last night. No chest pain. No exacerbating or alleviating factors.    Past Medical History  Diagnosis Date  . Arthritis   . Bladder cancer ([place])   . Diabetes ([place])   . Glaucoma   . Hypertension   . Sleep apnea   . Atrial fibrillation ([place])   . CHF (congestive heart failure) ([place])   . Atrial fibrillation ([place])   . Anemia   . Esophageal reflux   . Dyslipidemia   . LVF (left ventricular failure) ([place])   . CAD (coronary artery disease)   . Chronic kidney disease     Patient Active Problem List   Diagnosis Date Noted  . CHF (congestive heart failure) ([place]) 08/15/2015  . Pleural effusion 04/19/2015  . Thrombocytopenia ([place]) 02/18/2015    Past Surgical History  Procedure Laterality Date  . Knee arthroscopy    . Aortic valve replacement    . Left hip hemiarthroplasty      Current Outpatient Rx  Name  Route  Sig  Dispense  Refill  . allopurinol (ZYLOPRIM) 100 MG tablet   Oral   Take 100 mg by mouth every evening.          Marland Kitchen aspirin EC 81 MG tablet   Oral   Take 81 mg by mouth every morning.          Marland Kitchen azelastine (ASTELIN)  0.1 % nasal spray   Each Nare   Place 2 sprays into both nostrils 2 (two) times daily. Use in each nostril as directed         . cephALEXin (KEFLEX) 500 MG capsule   Oral   Take 1 capsule (500 mg total) by mouth every 12 (twelve) hours.   8 capsule   0   . cholecalciferol (VITAMIN D) 1000 UNITS tablet   Oral   Take 1,000 Units by mouth every morning.          . DUREZOL 0.05 % EMUL   Left Eye   Place 1 drop into the left eye 2 (two) times daily.      0     Dispense as written.   . feeding supplement, GLUCERNA SHAKE, (GLUCERNA SHAKE) LIQD   Oral   Take 237 mLs by mouth 3 (three) times daily with meals.   10 Can   0   . ferrous sulfate 325 (65 FE) MG tablet   Oral   Take 325 mg by mouth every evening.          . furosemide (LASIX) 40 MG tablet   Oral   Take 1 tablet (40 mg total) by mouth daily as needed for fluid or edema. Baseline weight is around 184  Lb, so check weight daily- if You weigh more than 188- take 40 mg tablet that day, if you have to take if 3 consicutive days- contact your PMD or cardiologist.   60 tablet   0   . metolazone (ZAROXOLYN) 2.5 MG tablet   Oral   Take 2.5 mg by mouth daily as needed (for edema). *Based on weight- 177lbs take this medication and 174lbs don't take this medication*         . omeprazole (PRILOSEC) 20 MG capsule   Oral   Take 20 mg by mouth every morning.          Marland Kitchen PROAIR HFA 108 (90 Base) MCG/ACT inhaler   Inhalation   Inhale 2 puffs into the lungs every 4 (four) hours as needed. For wheezing or shortness of breath.      0     Dispense as written.   . sertraline (ZOLOFT) 50 MG tablet   Oral   Take 50 mg by mouth every evening.          . simvastatin (ZOCOR) 40 MG tablet   Oral   Take 40 mg by mouth at bedtime.         . sitaGLIPtin (JANUVIA) 50 MG tablet   Oral   Take 50 mg by mouth every evening.          . tamsulosin (FLOMAX) 0.4 MG CAPS capsule   Oral   Take 1 capsule (0.4 mg total) by mouth  at bedtime.   30 capsule   0   . vitamin B-12 (CYANOCOBALAMIN) 1000 MCG tablet   Oral   Take 1,000 mcg by mouth every morning.            Allergies Lidocaine and Penicillins  Family History  Problem Relation Age of Onset  . Heart attack Father   . Hypertension Father   . Lung cancer Sister     Social History Social History  Substance Use Topics  . Smoking status: Former Smoker    Types: Cigarettes  . Smokeless tobacco: None  . Alcohol Use: None    Review of Systems  Constitutional: Negative for fever. Eyes: Negative for visual changes. ENT: Negative for sore throat. Cardiovascular: Negative for chest pain. Respiratory: Negative for shortness of breath. Gastrointestinal: Negative for  diarrhea. Genitourinary: Negative for dysuria. Musculoskeletal: Negative for back pain. Skin: Negative for rash. Neurological: Negative for headache. 10 point Review of Systems otherwise negative ____________________________________________   PHYSICAL EXAM:  VITAL SIGNS: ED Triage Vitals  Enc Vitals Group     BP 08/13/2015 1014 114/66 mmHg     Pulse Rate 08/19/2015 1014 72     Resp 08/10/2015 1014 24     Temp 08/12/2015 1014 98.7 F (37.1 C)     Temp Source 09/02/2015 1014 Oral     SpO2 08/30/2015 1011 96 %     Weight 08/31/2015 1014 178 lb (80.74 kg)     Height 09/07/2015 1014 6' (1.829 m)     Head Cir --      Peak Flow --      Pain Score 08/26/2015 1015 7     Pain Loc --      Pain Edu? --      Excl. in [place]? --      Constitutional: Alert and oriented. Well appearing and in no distress. Eyes: Conjunctivae are normal. PERRL. Normal extraocular movements. ENT   Head: Normocephalic and atraumatic.   Nose: No congestion/rhinnorhea.   Mouth/Throat: Mucous membranes  are moist.   Neck: No stridor. Cardiovascular/Chest: Normal rate, regular rhythm.  No murmurs, rubs, or gallops. Respiratory: Normal respiratory effort without tachypnea nor retractions. Breath sounds are clear  and equal bilaterally. No wheezes/rales/rhonchi. Gastrointestinal: Somewhat tympanic and distended. Tenderness to palpation diffusely especially in the mid abdomen.  Genitourinary/rectal:Deferred Musculoskeletal: Nontender with normal range of motion in all extremities. No joint effusions.  No lower extremity tenderness.  No edema. Neurologic:  Hard of hearing but otherwise Normal speech and language. No gross or focal neurologic deficits are appreciated. Skin:  Skin is warm, dry and intact. No rash noted. Psychiatric: Mood and affect are normal. Speech and behavior are normal. Patient exhibits appropriate insight and judgment.  ____________________________________________   EKG I, Lisa Roca, MD, the attending physician have personally viewed and interpreted all ECGs.  73 bpm. Undetermined rhythm but regular. Normal sinus rhythm versus a flutter. Nonspecific intraventricular conduction delay and left axis deviation. Nonspecific T-wave ____________________________________________  LABS (pertinent positives/negatives)  Lactate 1.0 White blood cell count 11.5 with left shift. Hemoglobin 9.4 and platelet count 91 Complete metabolic panel significant for BUN 114, creatinine 3.83. Potassium is 4.2   ____________________________________________  RADIOLOGY All Xrays were viewed by me. Imaging interpreted by Radiologist.  Acute abdomen with chest:  IMPRESSION: Multiple mildly dilated small bowel loops containing air-fluid levels, with paucity of colonic gas. A partial or low-grade small bowel obstruction cannot be excluded.  Stable congestive heart failure with small to moderate right pleural effusion.  CT abdomen and pelvis with contrast:   IMPRESSION: Large developing pelvic abscess measuring 17.4 x 11.8 x 13.7 cm. This fluid collection contains extensive air. There is also moderate ascites in the abdomen or pelvis.  Left inguinal hernia containing fluid,  stable.  Cholelithiasis.  Bilateral pleural effusions with bibasilar consolidation, larger on the right than on the left. Lower lobe scarring and bronchiectatic change bilaterally.  Sigmoid diverticula without diverticulitis. No bowel obstruction.  No renal or ureteral calculus. No hydronephrosis.  Extensive atherosclerotic calcification aorta as well as in the proximal major mesenteric arteries and proximal right kidney. No bowel pneumatosis. No portal venous air. No frank pneumoperitoneum seen.  Critical Value/emergent results were called by telephone at the time of interpretation on 08/08/2015 at 3:51 pm to Dr. Lisa Roca , who verbally acknowledged these results. __________________________________________  PROCEDURES  Procedure(s) performed: None  Critical Care performed: None  ____________________________________________   ED COURSE / ASSESSMENT AND PLAN  CONSULTATIONS: [facility] for admission.  Dr. Burt Knack, general surgery  Pertinent labs & imaging results that were available during my care of the patient were reviewed by me and considered in my medical decision making (see chart for details).  His abdomen is somewhat distended raising some concern for the possibility of obstruction in the setting of one episode of emesis this morning. No free air on abdominal series with chest.   Urinalysis negative for UTI White blood cell count minimally elevated. Hemoglobin is at baseline.  Comprehensive metabolic panel significant for elevated BUN and creatinine, baseline creatinine around 2 and creatinine today 3.83.  Potassium is normal.  Acutely concerned about patient having a small bowel obstruction in the x-ray shows some dilated small bowel loops. Patient will get a CT of abdomen. He is unable to receive IV contrast due to acute renal failure. He is unable to take by mouth contrast due to inability to tolerate this. Patient will be skin without both.  In terms of  his anasarca and right pleural effusion, this is  due to his congestive heart failure and renal disease. Unable to do any increase diuresis given his acute on chronic renal failure at present.  Patient's nephrologist is Dr. Candiss Norse.  ----------------------------------------- 3:57 PM on 08/12/2015 -----------------------------------------  I took report from radiologist regarding concern for developing phlegmon versus abscess in the pelvis due to fluid with air pockets. I discussed and reviewed the imaging with surgeon on call Dr. Burt Knack. Blood cultures were sent and antibiotics were initiated.  Patient reports significant reaction to penicillin. For abdominal infection Cipro and Flagyl were chosen.   Family understands the dire nature of this diagnosis and the patient is a DO NOT RESUSCITATE. Their main concern is that the patient be made comfortable. They do not want him to receive surgery. They do want to go ahead and treat with antibiotics and certainly admit him and make him comfortable. I discussed this case with Dr.Konidena with [facility] for admission.   Patient / Family / Caregiver informed of clinical course, medical decision-making process, and agree with plan.   ___________________________________________   FINAL CLINICAL IMPRESSION(S) / ED DIAGNOSES   Final diagnoses:  Anasarca  Acute renal failure, unspecified acute renal failure type ([place])  Intra-abdominal abscess ([place])              Note: This dictation was prepared with Dragon dictation. Any transcriptional errors that result from this process are unintentional   Lisa Roca, MD 08/28/2015 [phone]

## 2015-08-22 NOTE — Consult Note (Signed)
Surgical Consultation  08/17/2015  [name] is an 82 y.o. male.   CC: Abdominal pain  HPI: This patient with approximately 10 days of abdominal pain he was seen in the emergency room last week with that lower abdominal pain and was treated for a UTI he was never pain-free in the interim but over this weekend he has worsened and points to it somewhat to the area across his upper abdomen as well. He denies fevers or chills has been constipated and has not had any nausea or vomiting  Patient has a significant history of renal failure fluid retention and congestive heart failure he has not had a heart attack or stroke  Past Medical History  Diagnosis Date  . Arthritis   . Bladder cancer ([place])   . Diabetes ([place])   . Glaucoma   . Hypertension   . Sleep apnea   . Atrial fibrillation ([place])   . CHF (congestive heart failure) ([place])   . Atrial fibrillation ([place])   . Anemia   . Esophageal reflux   . Dyslipidemia   . LVF (left ventricular failure) ([place])   . CAD (coronary artery disease)   . Chronic kidney disease     Past Surgical History  Procedure Laterality Date  . Knee arthroscopy    . Aortic valve replacement    . Left hip hemiarthroplasty      Family History  Problem Relation Age of Onset  . Heart attack Father   . Hypertension Father   . Lung cancer Sister     Social History:  reports that he has quit smoking. His smoking use included Cigarettes. He does not have any smokeless tobacco history on file. His alcohol and drug histories are not on file.  Allergies:  Allergies  Allergen Reactions  . Lidocaine Other (See Comments)    Reaction:  Hypotension   . Penicillins Other (See Comments)    Reaction:  Swollen feet Has patient had a PCN reaction causing immediate rash, facial/tongue/throat swelling, SOB or lightheadedness with hypotension: Yes Has patient had a PCN reaction causing severe rash involving mucus membranes or skin necrosis: No Has patient had a PCN  reaction that required hospitalization No Has patient had a PCN reaction occurring within the last 10 years: No If all of the above answers are "NO", then may proceed with Cephalosporin use.    Medications reviewed.   Review of Systems:   Review of Systems  Constitutional: Positive for malaise/fatigue. Negative for fever, chills and weight loss.  HENT: Negative.   Eyes: Negative.   Respiratory: Negative.   Cardiovascular: Negative.   Gastrointestinal: Positive for abdominal pain and constipation. Negative for heartburn, nausea, vomiting, diarrhea, blood in stool and melena.  Genitourinary: Negative.   Musculoskeletal: Negative.   Skin: Negative.   Neurological: Negative.   Endo/Heme/Allergies: Negative.   Psychiatric/Behavioral: Negative.      Physical Exam:  BP 113/43 mmHg  Pulse 70  Temp(Src) 98.7 F (37.1 C) (Oral)  Resp 20  Ht 6' (1.829 m)  Wt 178 lb (80.74 kg)  BMI 24.14 kg/m2  SpO2 99%  Physical Exam  Constitutional: He is oriented to person, place, and time and well-developed, well-nourished, and in no distress. No distress.  HENT:  Head: Normocephalic and atraumatic.  Somewhat dry mucous membranes  Eyes: Pupils are equal, round, and reactive to light. Right eye exhibits no discharge. Left eye exhibits no discharge. Scleral icterus is present.  Pallor and somewhat jaundiced  Neck: Normal range of motion.  Neck supple.  Cardiovascular: Normal rate and regular rhythm.   No murmur heard. Pulmonary/Chest: No respiratory distress. He has wheezes. He has rales. He exhibits no tenderness.  Abdominal: He exhibits distension. There is tenderness. There is no rebound and no guarding.  Maximum tenderness in both lower quadrants left greater than right abdomen is greatly distended and tympanitic no rebound tenderness but there is percussion tenderness in the lower quadrants not in the upper quadrants  Genitourinary: Penis normal.  Wears a diaper  Musculoskeletal: Normal  range of motion. He exhibits edema. He exhibits no tenderness.  Stream edema of the lower extremities  Lymphadenopathy:    He has no cervical adenopathy.  Neurological: He is alert and oriented to person, place, and time.  Skin: Skin is warm and dry. He is not diaphoretic. No erythema.  Considerable ecchymosis over her arms and lower extremities  Psychiatric: Mood and affect normal.  Vitals reviewed.     Results for orders placed or performed during the hospital encounter of 09/04/2015 (from the past 48 hour(s))  Comprehensive metabolic panel     Status: Abnormal   Collection Time: 09/04/2015 10:09 AM  Result Value Ref Range   Sodium 137 135 - 145 mmol/L   Potassium 4.2 3.5 - 5.1 mmol/L   Chloride 96 (L) 101 - 111 mmol/L   CO2 31 22 - 32 mmol/L   Glucose, Bld 179 (H) 65 - 99 mg/dL   BUN 114 (H) 6 - 20 mg/dL    Comment: RESULTS CONFIRMED BY MANUAL DILUTION   Creatinine, Ser 3.83 (H) 0.61 - 1.24 mg/dL   Calcium 9.0 8.9 - 10.3 mg/dL   Total Protein 6.4 (L) 6.5 - 8.1 g/dL   Albumin 2.2 (L) 3.5 - 5.0 g/dL   AST 34 15 - 41 U/L   ALT 17 17 - 63 U/L   Alkaline Phosphatase 68 38 - 126 U/L   Total Bilirubin 1.9 (H) 0.3 - 1.2 mg/dL   GFR calc non Af Amer 12 (L) >60 mL/min   GFR calc Af Amer 14 (L) >60 mL/min    Comment: (NOTE) The eGFR has been calculated using the CKD EPI equation. This calculation has not been validated in all clinical situations. eGFR's persistently <60 mL/min signify possible Chronic Kidney Disease.    Anion gap 10 5 - 15  CBC with Differential     Status: Abnormal   Collection Time: 08/16/2015 10:09 AM  Result Value Ref Range   WBC 11.5 (H) 3.8 - 10.6 K/uL   RBC 2.88 (L) 4.40 - 5.90 MIL/uL   Hemoglobin 9.4 (L) 13.0 - 18.0 g/dL   HCT 29.1 (L) 40.0 - 52.0 %   MCV 100.9 (H) 80.0 - 100.0 fL   MCH 32.6 26.0 - 34.0 pg   MCHC 32.3 32.0 - 36.0 g/dL   RDW 15.0 (H) 11.5 - 14.5 %   Platelets 91 (L) 150 - 440 K/uL   Neutrophils Relative % 80% %   Neutro Abs 9.2 (H) 1.4  - 6.5 K/uL   Lymphocytes Relative 3% %   Lymphs Abs 0.4 (L) 1.0 - 3.6 K/uL   Monocytes Relative 14% %   Monocytes Absolute 1.6 (H) 0.2 - 1.0 K/uL   Eosinophils Relative 3% %   Eosinophils Absolute 0.3 0 - 0.7 K/uL   Basophils Relative 0% %   Basophils Absolute 0.0 0 - 0.1 K/uL  Culture, blood (routine x 2)     Status: None (Preliminary result)   Collection Time: 08/10/2015 10:27  AM  Result Value Ref Range   Specimen Description BLOOD RIGHT FOREARM    Special Requests BOTTLES DRAWN AEROBIC AND ANAEROBIC  1CC    Culture NO GROWTH < 12 HOURS    Report Status PENDING   Lactic acid, plasma     Status: None   Collection Time: 08/20/2015 10:27 AM  Result Value Ref Range   Lactic Acid, Venous 1.0 0.5 - 2.0 mmol/L  Culture, blood (routine x 2)     Status: None (Preliminary result)   Collection Time: 08/28/2015 11:56 AM  Result Value Ref Range   Specimen Description BLOOD LEFT ANTECUBITAL    Special Requests BOTTLES DRAWN AEROBIC AND ANAEROBIC  1CC    Culture NO GROWTH < 12 HOURS    Report Status PENDING   Urinalysis complete, with microscopic ([facility] only)     Status: Abnormal   Collection Time: 09/01/2015 12:19 PM  Result Value Ref Range   Color, Urine AMBER (A) YELLOW   APPearance HAZY (A) CLEAR   Glucose, UA NEGATIVE NEGATIVE mg/dL   Bilirubin Urine NEGATIVE NEGATIVE   Ketones, ur NEGATIVE NEGATIVE mg/dL   Specific Gravity, Urine 1.015 1.005 - 1.030   Hgb urine dipstick NEGATIVE NEGATIVE   pH 5.0 5.0 - 8.0   Protein, ur NEGATIVE NEGATIVE mg/dL   Nitrite NEGATIVE NEGATIVE   Leukocytes, UA NEGATIVE NEGATIVE   RBC / HPF 0-5 0 - 5 RBC/hpf   WBC, UA 0-5 0 - 5 WBC/hpf   Bacteria, UA NONE SEEN NONE SEEN   Squamous Epithelial / LPF 0-5 (A) NONE SEEN   Mucous PRESENT    Hyaline Casts, UA PRESENT    Granular Casts, UA PRESENT   Lactic acid, plasma     Status: None   Collection Time: 08/31/2015  2:40 PM  Result Value Ref Range   Lactic Acid, Venous 0.8 0.5 - 2.0 mmol/L   Ct Abdomen Pelvis Wo  Contrast  08/25/2015  CLINICAL DATA:  Progressive abdominal pain and distention EXAM: CT ABDOMEN AND PELVIS WITHOUT CONTRAST TECHNIQUE: Multidetector CT imaging of the abdomen and pelvis was performed following the standard protocol without oral or intravenous contrast maternal administration. COMPARISON:  August 15, 2015 FINDINGS: Lower chest: There is a moderate right pleural effusion with right base consolidation. There is a smaller left pleural effusion with left base atelectasis posteriorly. There is patchy atelectasis in both lung bases with bilateral lower lobe bronchiectatic change. Pacemaker leads are attached to the right atrium and right ventricle. There is extensive coronary artery calcification present. Hepatobiliary: There is a 1 cm probable cyst in the anterior segment right lobe liver near the fissure for the ligamentum teres. A small amount of vascular calcification is noted in the anterior aspect of the liver. No new liver lesions are identified. There is cholelithiasis in the gallbladder. The gallbladder wall does not appear appreciably thickened. No biliary duct dilatation. Pancreas: No mass or inflammatory focus. Spleen: No splenic lesions are identified beyond vascular calcification. Adrenals/Urinary Tract: Adrenals appear normal bilaterally. There is moderate renal sinus fat bilaterally. There is no renal mass or hydronephrosis on either side. No renal or ureteral calculus on either side. Urinary bladder is midline with wall thickness within normal limits. Stomach/bowel: There are multiple sigmoid diverticula without diverticulitis. No bowel wall or mesenteric thickening. There is no appreciable bowel obstruction. No portal venous air. Pneumoperitoneum is not appreciated, although there is extensive air within the ascitic fluid. No bowel pneumatosis. Vascular/Lymphatic: There is extensive atherosclerotic calcification in the aorta. There  is extensive calcification at the origin of the  superior mesenteric and celiac arteries as well as the right renal artery. No abdominal aortic aneurysm. No adenopathy appreciable in the abdomen pelvis. Reproductive: Prostate and seminal vesicles appear grossly normal. No pelvic mass. Other: There is extensive ascitic fluid throughout the abdomen and pelvis. In the fluid in the anterior pelvis, there is extensive soft tissue air, likely infection. The appearance is consistent with developing abscess. The air-containing fluid collection in the pelvis measures 17.4 x 11.8 x 13.7 cm. There is no periappendiceal region inflammation. There is a left inguinal hernia which contains fluid. Bowel is not seen in this hernia. The appearance of this hernia is stable compared to recent study. There is also fat in the right inguinal ring. Musculoskeletal: Patient is status post left total hip replacement. There is arthropathy in the right hip joint. Bones are osteoporotic. There is degenerative change in the lumbar spine. There are no blastic or lytic bone lesions. There is no intramuscular lesion or abdominal wall lesion. IMPRESSION: Large developing pelvic abscess measuring 17.4 x 11.8 x 13.7 cm. This fluid collection contains extensive air. There is also moderate ascites in the abdomen or pelvis. Left inguinal hernia containing fluid, stable. Cholelithiasis. Bilateral pleural effusions with bibasilar consolidation, larger on the right than on the left. Lower lobe scarring and bronchiectatic change bilaterally. Sigmoid diverticula without diverticulitis.  No bowel obstruction. No renal or ureteral calculus.  No hydronephrosis. Extensive atherosclerotic calcification aorta as well as in the proximal major mesenteric arteries and proximal right kidney. No bowel pneumatosis. No portal venous air. No frank pneumoperitoneum seen. Critical Value/emergent results were called by telephone at the time of interpretation on 08/13/2015 at 3:51 pm to Dr. Lisa Roca , who verbally  acknowledged these results. Electronically Signed   By: Lowella Grip III M.D.   On: 08/11/2015 15:52   Dg Abd Acute W/chest  08/19/2015  CLINICAL DATA:  Abdominal pain and distention. Shortness of breath for several days. EXAM: DG ABDOMEN ACUTE W/ 1V CHEST COMPARISON:  08/15/2015 FINDINGS: Multiple mildly dilated small bowel loops are seen containing air-fluid levels. There is a paucity of colonic gas. A partial or low-grade small bowel obstruction cannot be excluded. There is no evidence free air. Residual contrast is seen within the distal colon showing diverticulosis. Mild cardiomegaly stable. Pacemaker remains in place. Diffuse interstitial edema pattern and small to moderate right pleural effusion show no significant change. IMPRESSION: Multiple mildly dilated small bowel loops containing air-fluid levels, with paucity of colonic gas. A partial or low-grade small bowel obstruction cannot be excluded. Stable congestive heart failure with small to moderate right pleural effusion. Electronically Signed   By: Earle Gell M.D.   On: 09/05/2015 12:06    Assessment/Plan:  CT scan is personally reviewed and discussed with Dr. Reita Cliche and we also compared to the scan from 08/15/2015. Really there is worsening ascites and right pleural effusion which is considerably large. There is a fluid collection in the pelvis which has gas within it but does not appear to be thick walled and the surrounding colon does not appear to be inflamed.  Etiology of this is unclear but with his multiple medical problems I doubt that surgical intervention would be the best first choice in fact CT-guided drainage would probably be the best approach in this patient.  I discussed this approach with the family and they discussed with me that he is a DO NOT RESUSCITATE patient and they do not want to  pursue aggressive surgical intervention but want him to be kept comfortable.  I discussed this discussion with the prime doc  hospitalist and with Dr. Reita Cliche he will be admitted and reassessed for either comfort care only or minimal intervention with CT-guided drainage.  Florene Glen, MD, FACS

## 2015-08-23 LAB — BASIC METABOLIC PANEL
Anion gap: 9 (ref 5–15)
BUN: 117 mg/dL — AB (ref 6–20)
CALCIUM: 8.9 mg/dL (ref 8.9–10.3)
CHLORIDE: 102 mmol/L (ref 101–111)
CO2: 29 mmol/L (ref 22–32)
CREATININE: 3.62 mg/dL — AB (ref 0.61–1.24)
GFR, EST AFRICAN AMERICAN: 15 mL/min — AB (ref >60)
GFR, EST NON AFRICAN AMERICAN: 13 mL/min — AB (ref >60)
Glucose, Bld: 118 mg/dL — ABNORMAL HIGH (ref 65–99)
Potassium: 3.9 mmol/L (ref 3.5–5.1)
SODIUM: 140 mmol/L (ref 135–145)

## 2015-08-23 LAB — CBC
HEMATOCRIT: 27.1 % — AB (ref 40.0–52.0)
HEMOGLOBIN: 9 g/dL — AB (ref 13.0–18.0)
MCH: 33.5 pg (ref 26.0–34.0)
MCHC: 33.2 g/dL (ref 32.0–36.0)
MCV: 100.7 fL — ABNORMAL HIGH (ref 80.0–100.0)
Platelets: 110 10*3/uL — ABNORMAL LOW (ref 150–440)
RBC: 2.69 MIL/uL — ABNORMAL LOW (ref 4.40–5.90)
RDW: 15 % — AB (ref 11.5–14.5)
WBC: 10.3 10*3/uL (ref 3.8–10.6)

## 2015-08-23 LAB — GLUCOSE, CAPILLARY: Glucose-Capillary: 113 mg/dL — ABNORMAL HIGH (ref 65–99)

## 2015-08-24 LAB — URINE CULTURE: CULTURE: NO GROWTH

## 2015-08-24 LAB — GLUCOSE, CAPILLARY
GLUCOSE-CAPILLARY: 160 mg/dL — AB (ref 65–99)
Glucose-Capillary: 132 mg/dL — ABNORMAL HIGH (ref 65–99)

## 2015-08-27 LAB — CULTURE, BLOOD (ROUTINE X 2)
Culture: NO GROWTH
Culture: NO GROWTH

## 2015-09-08 ENCOUNTER — Ambulatory Visit: Payer: Medicare PPO | Admitting: Family

## 2015-09-08 NOTE — Progress Notes (Signed)
[facility]  ROUNDING NOTE   Subjective:  Patient well known to [place]. Was here recently, was discharged on 08/18/15.  Had acute CHF exacerbation then as well as UTI.   Has had poor PO intake over the past week.      Objective:  Vital signs in last 24 hours:  Temp:  [97.4 F (36.3 C)-98.1 F (36.7 C)] 98.1 F (36.7 C) (01/16 1232) Pulse Rate:  [66-79] 79 (01/16 1232) Resp:  [16-25] 16 (01/16 1232) BP: (93-121)/(40-60) 93/45 mmHg (01/16 1232) SpO2:  [93 %-100 %] 93 % (01/16 1232) Weight:  [83.779 kg (184 lb 11.2 oz)-84.233 kg (185 lb 11.2 oz)] 84.233 kg (185 lb 11.2 oz) (01/16 0506)  Weight change:  Filed Weights   08/31/2015 1014 08/31/2015 2001 26-Aug-2015 0506  Weight: 80.74 kg (178 lb) 83.779 kg (184 lb 11.2 oz) 84.233 kg (185 lb 11.2 oz)    Intake/Output: I/O last 3 completed shifts: In: 252.5 [I.V.:252.5] Out: 0    Intake/Output this shift:  Total I/O In: 259 [I.V.:259] Out: -   Physical Exam: General: Chronically ill appearing  Head: Normocephalic, atraumatic. Dry oral mucosal membranes  Eyes: Anicteric  Neck: Supple, trachea midline  Lungs:  Clear to auscultation normal effort  Heart: S1S3 no obvious rub  Abdomen:  Soft, nontender, distension noted  Extremities:  1+ peripheral edema.  Neurologic: Nonfocal, moving all four extremities  Skin: No lesions       Basic Metabolic Panel:  Recent Labs Lab 08/17/15 1039 08/18/15 1011 08/21/2015 1009 Aug 26, 2015 0453  NA 138 138 137 140  K 4.2 4.0 4.2 3.9  CL 96* 97* 96* 102  CO2 30 29 31 29   GLUCOSE 131* 168* 179* 118*  BUN 76* 88* 114* 117*  CREATININE 2.30* 2.54* 3.83* 3.62*  CALCIUM 8.8* 8.7* 9.0 8.9    Liver Function Tests:  Recent Labs Lab 08/13/2015 1009  AST 34  ALT 17  ALKPHOS 68  BILITOT 1.9*  PROT 6.4*  ALBUMIN 2.2*   No results for input(s): LIPASE, AMYLASE in the last 168 hours. No results for input(s): AMMONIA in the last 168 hours.  CBC:  Recent Labs Lab 08/12/2015 1009  Aug 26, 2015 0453  WBC 11.5* 10.3  NEUTROABS 9.2*  --   HGB 9.4* 9.0*  HCT 29.1* 27.1*  MCV 100.9* 100.7*  PLT 91* 110*    Cardiac Enzymes: No results for input(s): CKTOTAL, CKMB, CKMBINDEX, TROPONINI in the last 168 hours.  BNP: Invalid input(s): POCBNP  CBG:  Recent Labs Lab 08/17/15 1610 08/17/15 2113 08/18/15 0744 08/18/15 1112 08-26-2015 0753  GLUCAP 130* 120* 134* 168* 113*    Microbiology: Results for orders placed or performed during the hospital encounter of 08/25/2015  Culture, blood (routine x 2)     Status: None (Preliminary result)   Collection Time: 08/21/2015 10:27 AM  Result Value Ref Range Status   Specimen Description BLOOD RIGHT FOREARM  Final   Special Requests BOTTLES DRAWN AEROBIC AND ANAEROBIC  1CC  Final   Culture NO GROWTH < 12 HOURS  Final   Report Status PENDING  Incomplete  Culture, blood (routine x 2)     Status: None (Preliminary result)   Collection Time: 08/12/2015 11:56 AM  Result Value Ref Range Status   Specimen Description BLOOD LEFT ANTECUBITAL  Final   Special Requests BOTTLES DRAWN AEROBIC AND ANAEROBIC  1CC  Final   Culture NO GROWTH < 12 HOURS  Final   Report Status PENDING  Incomplete  Urine culture  Status: None (Preliminary result)   Collection Time: 08/21/2015 12:19 PM  Result Value Ref Range Status   Specimen Description URINE, RANDOM  Final   Special Requests NONE  Final   Culture NO GROWTH < 24 HOURS  Final   Report Status PENDING  Incomplete    Coagulation Studies: No results for input(s): LABPROT, INR in the last 72 hours.  Urinalysis:  Recent Labs  08/20/2015 1219  COLORURINE AMBER*  LABSPEC 1.015  PHURINE 5.0  GLUCOSEU NEGATIVE  HGBUR NEGATIVE  BILIRUBINUR NEGATIVE  KETONESUR NEGATIVE  PROTEINUR NEGATIVE  NITRITE NEGATIVE  LEUKOCYTESUR NEGATIVE      Imaging: Ct Abdomen Pelvis Wo Contrast  08/25/2015  CLINICAL DATA:  Progressive abdominal pain and distention EXAM: CT ABDOMEN AND PELVIS WITHOUT CONTRAST  TECHNIQUE: Multidetector CT imaging of the abdomen and pelvis was performed following the standard protocol without oral or intravenous contrast maternal administration. COMPARISON:  August 15, 2015 FINDINGS: Lower chest: There is a moderate right pleural effusion with right base consolidation. There is a smaller left pleural effusion with left base atelectasis posteriorly. There is patchy atelectasis in both lung bases with bilateral lower lobe bronchiectatic change. Pacemaker leads are attached to the right atrium and right ventricle. There is extensive coronary artery calcification present. Hepatobiliary: There is a 1 cm probable cyst in the anterior segment right lobe liver near the fissure for the ligamentum teres. A small amount of vascular calcification is noted in the anterior aspect of the liver. No new liver lesions are identified. There is cholelithiasis in the gallbladder. The gallbladder wall does not appear appreciably thickened. No biliary duct dilatation. Pancreas: No mass or inflammatory focus. Spleen: No splenic lesions are identified beyond vascular calcification. Adrenals/Urinary Tract: Adrenals appear normal bilaterally. There is moderate renal sinus fat bilaterally. There is no renal mass or hydronephrosis on either side. No renal or ureteral calculus on either side. Urinary bladder is midline with wall thickness within normal limits. Stomach/bowel: There are multiple sigmoid diverticula without diverticulitis. No bowel wall or mesenteric thickening. There is no appreciable bowel obstruction. No portal venous air. Pneumoperitoneum is not appreciated, although there is extensive air within the ascitic fluid. No bowel pneumatosis. Vascular/Lymphatic: There is extensive atherosclerotic calcification in the aorta. There is extensive calcification at the origin of the superior mesenteric and celiac arteries as well as the right renal artery. No abdominal aortic aneurysm. No adenopathy appreciable in  the abdomen pelvis. Reproductive: Prostate and seminal vesicles appear grossly normal. No pelvic mass. Other: There is extensive ascitic fluid throughout the abdomen and pelvis. In the fluid in the anterior pelvis, there is extensive soft tissue air, likely infection. The appearance is consistent with developing abscess. The air-containing fluid collection in the pelvis measures 17.4 x 11.8 x 13.7 cm. There is no periappendiceal region inflammation. There is a left inguinal hernia which contains fluid. Bowel is not seen in this hernia. The appearance of this hernia is stable compared to recent study. There is also fat in the right inguinal ring. Musculoskeletal: Patient is status post left total hip replacement. There is arthropathy in the right hip joint. Bones are osteoporotic. There is degenerative change in the lumbar spine. There are no blastic or lytic bone lesions. There is no intramuscular lesion or abdominal wall lesion. IMPRESSION: Large developing pelvic abscess measuring 17.4 x 11.8 x 13.7 cm. This fluid collection contains extensive air. There is also moderate ascites in the abdomen or pelvis. Left inguinal hernia containing fluid, stable. Cholelithiasis. Bilateral pleural effusions  with bibasilar consolidation, larger on the right than on the left. Lower lobe scarring and bronchiectatic change bilaterally. Sigmoid diverticula without diverticulitis.  No bowel obstruction. No renal or ureteral calculus.  No hydronephrosis. Extensive atherosclerotic calcification aorta as well as in the proximal major mesenteric arteries and proximal right kidney. No bowel pneumatosis. No portal venous air. No frank pneumoperitoneum seen. Critical Value/emergent results were called by telephone at the time of interpretation on 08/14/2015 at 3:51 pm to Dr. Lisa Roca , who verbally acknowledged these results. Electronically Signed   By: Lowella Grip III M.D.   On: 09/01/2015 15:52   Dg Abd Acute  W/chest  08/29/2015  CLINICAL DATA:  Abdominal pain and distention. Shortness of breath for several days. EXAM: DG ABDOMEN ACUTE W/ 1V CHEST COMPARISON:  08/15/2015 FINDINGS: Multiple mildly dilated small bowel loops are seen containing air-fluid levels. There is a paucity of colonic gas. A partial or low-grade small bowel obstruction cannot be excluded. There is no evidence free air. Residual contrast is seen within the distal colon showing diverticulosis. Mild cardiomegaly stable. Pacemaker remains in place. Diffuse interstitial edema pattern and small to moderate right pleural effusion show no significant change. IMPRESSION: Multiple mildly dilated small bowel loops containing air-fluid levels, with paucity of colonic gas. A partial or low-grade small bowel obstruction cannot be excluded. Stable congestive heart failure with small to moderate right pleural effusion. Electronically Signed   By: Earle Gell M.D.   On: 08/18/2015 12:06     Medications:   . sodium chloride 75 mL/hr at 2015/09/13 0942   . ciprofloxacin  400 mg Intravenous Q24H  . heparin  5,000 Units Subcutaneous 3 times per day  . metronidazole  500 mg Intravenous Q8H   acetaminophen **OR** acetaminophen, alum & mag hydroxide-simeth, bisacodyl, HYDROmorphone (DILAUDID) injection, ondansetron (ZOFRAN) IV  Assessment/ Plan:  82 y.o. male with h/o Pacemaker placed December 03, 2008 after syncope from sick sinus syndrome. Anemia of chronic kidney disease intermittent atrial fibrillation - chronic Anticoagulation, congestive heart failure, Hypertension, Chronic kidney disease, Diabetes Gout, History of bladder cancer 1988 - operation. Follows Dr. Rogers Blocker, GERD, Glaucoma, Colonoscopy in 2003, Aortic heart valve replacement (tissue valve) Jan 09, Heart Cath jan 2010, surgery for neoplasm on the scalp was done at [facility]., Admission [facility] 06/09/09-06/11/09 for proteus bactermia and ARF., Right knee replacement June 28, 2009 - redo after infected  prosthesis removal left hip fracture - surgical repair by Dr. Marry Guan, Left arm fracture, MDS  1.  Acute renal failure/CKD Stage IV:  ARF secondary to poor PO intake. Creatinine was 2.8 when he last left the hospital.  Creatinine currently 3.62 with a BUN of 117.  Continue IV fluid hydration for now.  Patient would not make a good long-term dialysis candidate.  For now continue conservative measures.  We talked about the possibility for temporary dialysis if renal function were to worsen however her family not sure that this would be consistent with his desires.  We will return for followup tomorrow and see the response to IV fluid hydration.  2.  Anemia chronic kidney disease with history of MDS.  Hemoglobin currently 9.4.  No urgent indication for Procrit.  Continue to monitor hemoglobin.  3.  Chronic systolic heart failure.  There is diffuse interstitial edema within the lungs noted on chest x-ray as well as a right pleural effusion with no significant change as compared to before.  Patient on IV fluids for now.  Followrespiratory status closely.   LOS: 1 Elizet Kaplan,  Destiny Hagin 01-25-171:52 PM

## 2015-09-08 NOTE — Progress Notes (Signed)
    Death Note please see Last Note for all details.   In breif 'ADMITTED FOR PELVIC ABCESS,FMAILY CHOSE COMFORT MEASURES,AND IV ABX.BUT DID NOT WANT SURGERY    [name] [id number] is a 82 y.o. male, Outpatient Primary MD for the patient is [name], MD  Pronounced dead by  Dr.Sharmain Lastra on 09-12-2015 at 2.58 pm.family is at bedside.offered emotional support.              Cause of death  abdominal pain secondary to worsening ascites, right pleural effusion: Pelvic fluid collection suggestive of infected ascitic fluid rather than the pelvic abscess as per surgery.;  #2. Acute on chronic renal failure:   Total clinical and documentation time for today Under 30 minutes   Last Note

## 2015-09-08 NOTE — Clinical Social Work Note (Signed)
Clinical Social Work Assessment  Patient Details  Name: [name] MRN: [id number] Date of Birth: [birth year]  Date of referral:  2015/08/29               Reason for consult:  Facility Placement                Permission sought to share information with:    Permission granted to share information::     Name::        Agency::     Relationship::     Contact Information:     Housing/Transportation Living arrangements for the past 2 months:  [place] of Information:  Adult Children Patient Interpreter Needed:  None Criminal Activity/Legal Involvement Pertinent to Current Situation/Hospitalization:  No - Comment as needed Significant Relationships:  Adult Children Lives with:  Facility Resident Do you feel safe going back to the place where you live?  Yes Need for family participation in patient care:  Yes (Comment)  Care giving concerns:  Patient is at the Healthcare part of [facility] currently.   Social Worker assessment / plan:  CSW received call from [name] at [facility] stated patient has been at their healthcare building for several days and that previously he was a part of their independent living. [name] stated that patient's family was paying a bedhold. CSW met with patient and patient's daughter: [name]: [phone]. Patient was sleeping and did now awaken. Patient's daughter informed CSW that the family was not sure what they were going to decide at discharge. Patient's daughter stated that it would be fine to go ahead and prepare as though he would return to the healthcare building though.   Employment status:  Retired Nurse, adult PT Recommendations:  Not assessed at this time Information / Referral to community resources:  [employer]  Patient/Family's Response to care:  Patient's daughter expressed appreciation for CSW assistance.   Patient/Family's Understanding of and Emotional Response to Diagnosis,  Current Treatment, and Prognosis:  Patient's daughter is one of multiple children for patient and she stated to CSW she is unsure what the family is going to decide about discharge disposition but would not elaborate.  Emotional Assessment Appearance:  Appears stated age Attitude/Demeanor/Rapport:   (sleeping and did not awaken) Affect (typically observed):   (sleeping and did not awaken) Orientation:  Oriented to Self Alcohol / Substance use:  Not Applicable Psych involvement (Current and /or in the community):  No (Comment)  Discharge Needs  Concerns to be addressed:  Care Coordination Readmission within the last 30 days:  No Current discharge risk:  None Barriers to Discharge:  No Barriers Identified   Shela Leff, LCSW 2015-08-29, 11:53 AM

## 2015-09-08 NOTE — Progress Notes (Signed)
°   09/12/15 0700  Clinical Encounter Type  Visited With Patient and family together  Visit Type Initial  Referral From Nurse  Consult/Referral To Chaplain  Spiritual Encounters  Spiritual Needs Emotional  Stress Factors  Patient Stress Factors Not reviewed  Family Stress Factors Not reviewed  Chaplain responded to consult orders for patient. Offered compassionate care. [place] Ext [phone]

## 2015-09-08 NOTE — NC FL2 (Signed)
[place] LEVEL OF CARE SCREENING TOOL     IDENTIFICATION  Patient Name: [name] Birthdate: 1920-01-16 Sex: male Admission Date (Current Location): 09/07/2015  [facility] and [place] Number:  Engineering geologist and Address:  [facility], [address] [place], [place] [address]      Provider Number: [id number]  Attending Physician Name and Address:  Fritzi Mandes, MD  Relative Name and Phone Number:       Current Level of Care: Hospital Recommended Level of Care: [place] Prior Approval Number:    Date Approved/Denied:   PASRR Number:    Discharge Plan: SNF    Current Diagnoses: Patient Active Problem List   Diagnosis Date Noted  . Abdominal pain 08/08/2015  . Acute renal failure ([place])   . Anasarca   . Intra-abdominal abscess ([place])   . Abscess between intestinal loops   . CHF (congestive heart failure) ([place]) 08/15/2015  . Pleural effusion 04/19/2015  . Thrombocytopenia ([place]) 02/18/2015    Orientation RESPIRATION BLADDER Height & Weight    Self  Normal Incontinent   185 lbs.  BEHAVIORAL SYMPTOMS/MOOD NEUROLOGICAL BOWEL NUTRITION STATUS   (none)  (none) Incontinent    AMBULATORY STATUS COMMUNICATION OF NEEDS Skin   Total Care                           Personal Care Assistance Level of Assistance  Total care Bathing Assistance: Maximum assistance Feeding assistance: Maximum assistance Dressing Assistance: Maximum assistance Total Care Assistance: Maximum assistance   Functional Limitations Info  Hearing   Hearing Info: Impaired      SPECIAL CARE FACTORS FREQUENCY  PT (By licensed PT)                    Contractures Contractures Info: Not present    Additional Factors Info  Code Status Code Status Info: DNR             Current Medications (08/25/15):  This is the current hospital active medication list Current Facility-Administered Medications  Medication Dose Route  Frequency Provider Last Rate Last Dose  . 0.9 %  sodium chloride infusion   Intravenous Continuous Epifanio Lesches, MD 75 mL/hr at 2015-08-25 0942    . acetaminophen (TYLENOL) tablet 650 mg  650 mg Oral Q6H PRN Epifanio Lesches, MD       Or  . acetaminophen (TYLENOL) suppository 650 mg  650 mg Rectal Q6H PRN Epifanio Lesches, MD      . alum & mag hydroxide-simeth (MAALOX/MYLANTA) 200-200-20 MG/5ML suspension 30 mL  30 mL Oral Q6H PRN Epifanio Lesches, MD      . bisacodyl (DULCOLAX) EC tablet 5 mg  5 mg Oral Daily PRN Epifanio Lesches, MD   5 mg at 08/11/2015 1745  . ciprofloxacin (CIPRO) IVPB 400 mg  400 mg Intravenous Q24H Lenis Noon, RPH   400 mg at 08/25/2015 1011  . heparin injection 5,000 Units  5,000 Units Subcutaneous 3 times per day Lenis Noon, [facility]   5,000 Units at 08/25/15 [id number]  . HYDROmorphone (DILAUDID) injection 1 mg  1 mg Intravenous Q4H PRN Epifanio Lesches, MD   1 mg at August 25, 2015 1011  . metroNIDAZOLE (FLAGYL) IVPB 500 mg  500 mg Intravenous Q8H Epifanio Lesches, MD   500 mg at 2015/08/25 0941  . ondansetron (ZOFRAN) injection 4 mg  4 mg Intravenous Q6H PRN Epifanio Lesches, MD  Discharge Medications: Please see discharge summary for a list of discharge medications.  Relevant Imaging Results:  Relevant Lab Results:   Additional Information    Shela Leff, LCSW

## 2015-09-08 NOTE — Discharge Summary (Signed)
Date of admission 08/11/2015 Date of death 2015/09/10 at 45  Final diagnosis  1. Abdominal pain secondary to worsening ascites appears infected 2 acute on chronic renal failure 3 hypertension 4. Cardiomyopathy 5. Chronic respiratory failure on home oxygen  Mr. [name] was a 82 year old Caucasian gentleman with history of diabetes and hypertension came into the emergency room with abdominal pain. Workup in the emergency room showed worsening ascites pleural effusion and after evaluation was found to have felt patient's fluid was infected rather than pelvic abscesses per surgery evaluation. Patient was started on IV broad-spectrum antibiotics. He continued to decline. He developed acute on chronic renal failure. [name] discussion was made with family. They chose to make him comfort measures no code DO NOT RESUSCITATE. Patient died on Sep 10, 2015 at 81.

## 2015-09-08 NOTE — Care Management (Signed)
RNCM consult placed as patient is from [facility].  CSW was following, however patient has expired.

## 2015-09-08 NOTE — Progress Notes (Addendum)
Patient passed away around 14:58. No apical pulse heard, no respiration. Md notified and pronounced patient expired. Supervisor notified . Family at bedside. Chaplain at bedside for support. Will provide post mortem care once family is ready. Post mortem care performed and pt was put in body bag. Taken down to morgue by orderly. Family has chosen [employer] [facility].

## 2015-09-08 DEATH — deceased

## 2015-09-27 ENCOUNTER — Ambulatory Visit: Payer: Medicare PPO | Admitting: Oncology

## 2015-09-27 ENCOUNTER — Other Ambulatory Visit: Payer: Medicare PPO

## 2016-08-26 IMAGING — CT CT ABD-PELV W/O CM
1 of 2 series · 15 of 32 positions shown, 19 images · non-contrast
Comparison: None.

CLINICAL DATA: Abdominal pain beginning last night. Diarrhea.
Initial encounter.

EXAM:
CT ABDOMEN AND PELVIS WITHOUT CONTRAST
TECHNIQUE: Multidetector CT imaging of the abdomen and pelvis was performed
following the standard protocol without IV contrast.

[Series 2: routine abd pel without · axial · non-contrast · 0.71mm/px · z∈[-452,-28]mm · 15 of 93 slices shown, 19 images]
[im 4/93  soft-tissue]
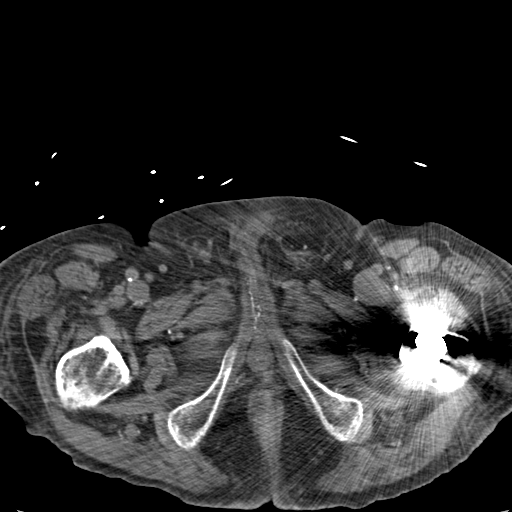
[im 4/93  bone]
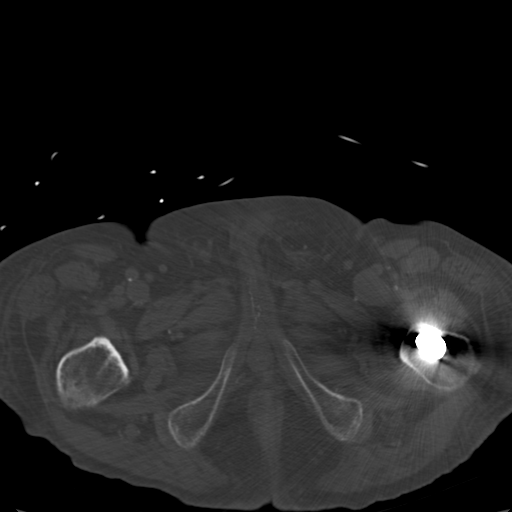
[im 12/93  soft-tissue]
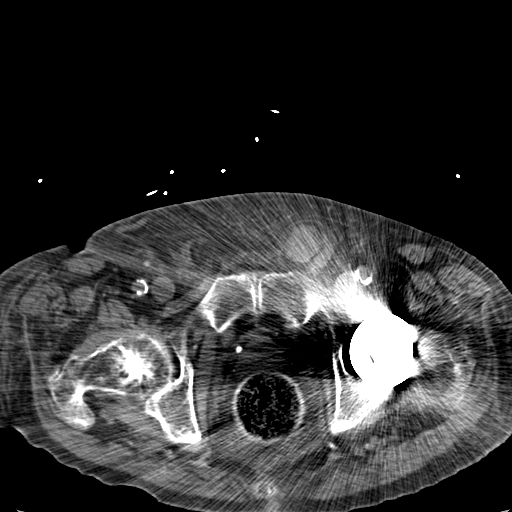
[im 20/93  soft-tissue]
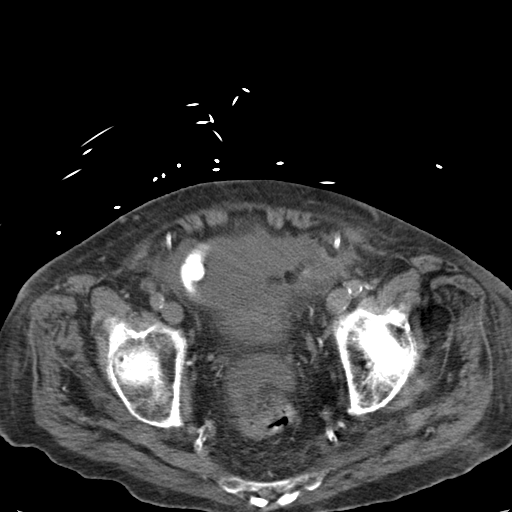
[im 27/93  soft-tissue]
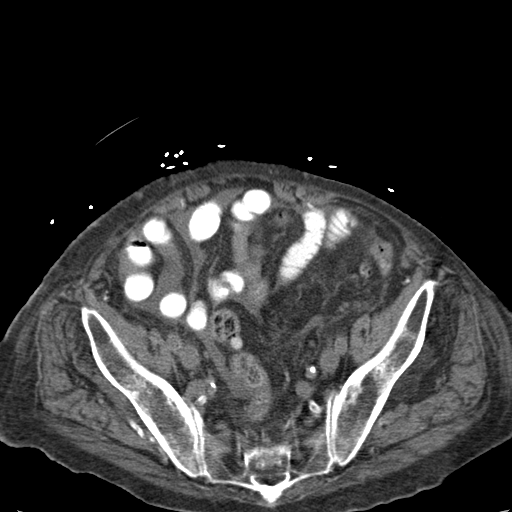
[im 31/93  soft-tissue]
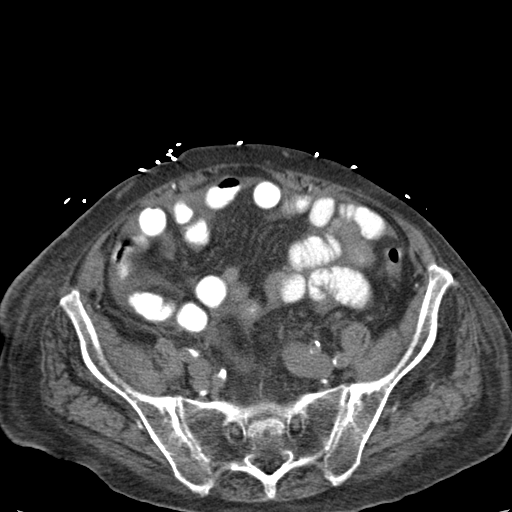
[im 39/93  soft-tissue]
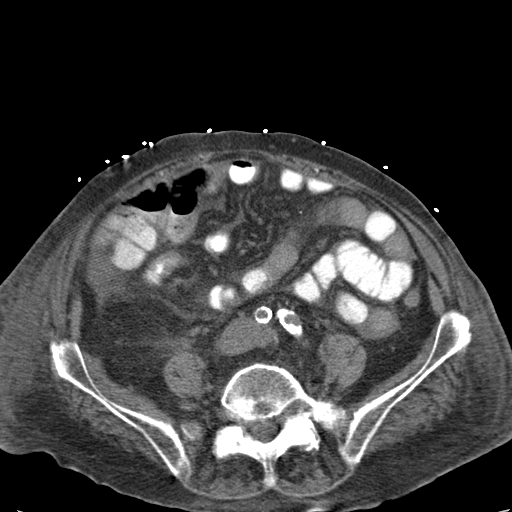
[im 47/93  soft-tissue]
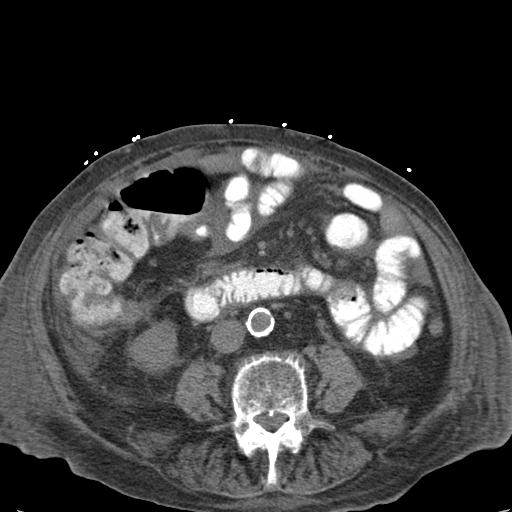
[im 54/93  soft-tissue]
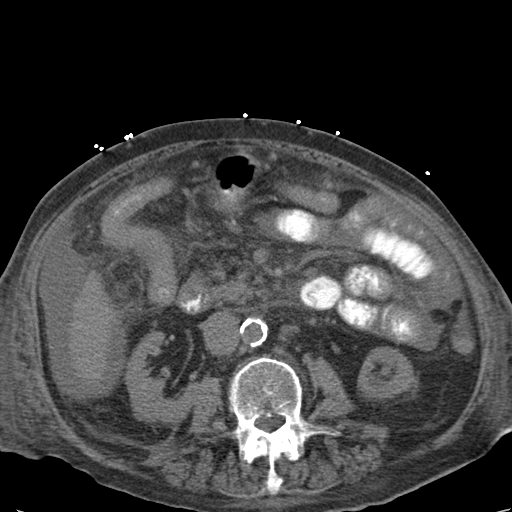
[im 62/93  soft-tissue]
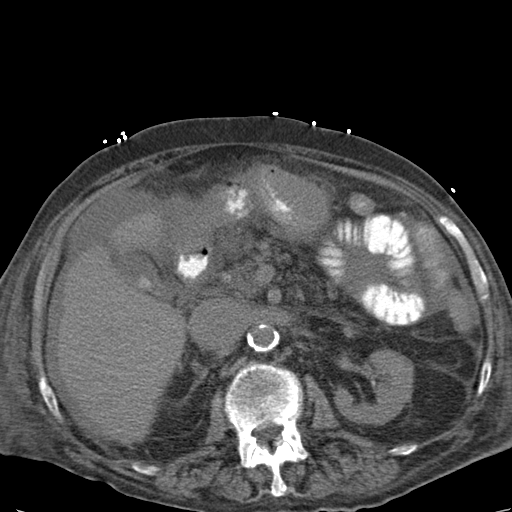
[im 62/93  bone]
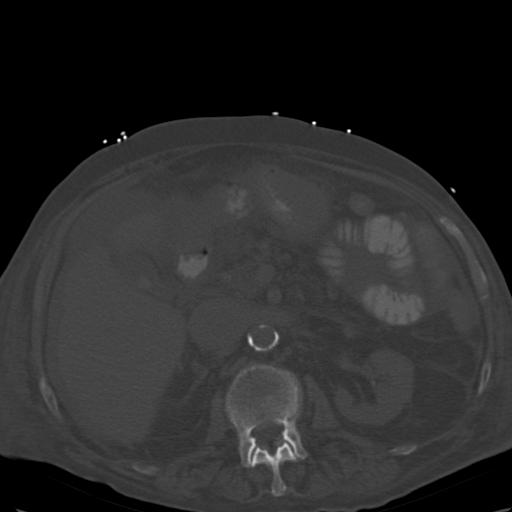
[im 66/93  soft-tissue]
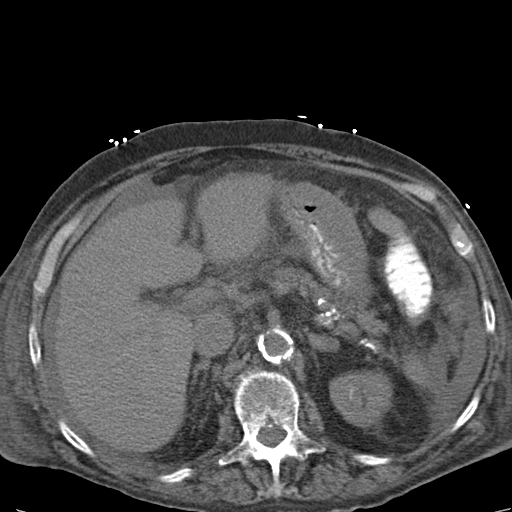
[im 73/93  soft-tissue]
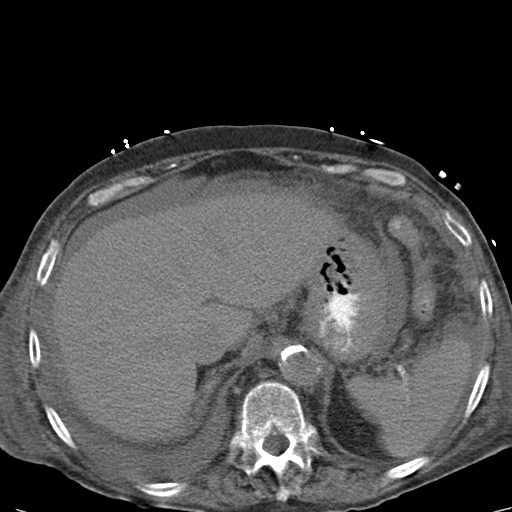
[im 77/93  lung]
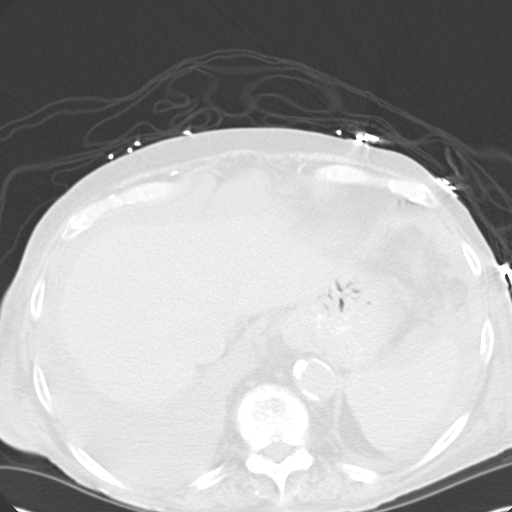
[im 81/93  soft-tissue]
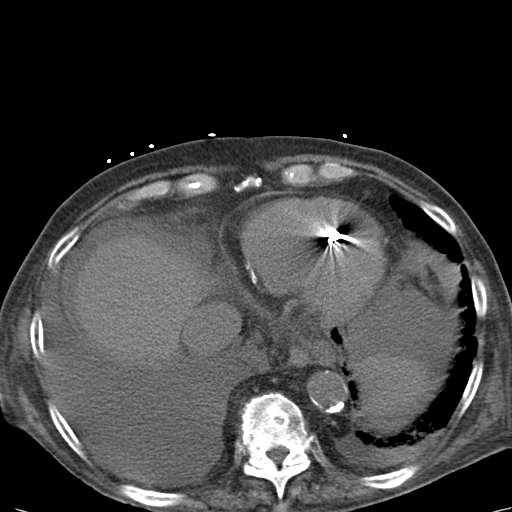
[im 81/93  lung]
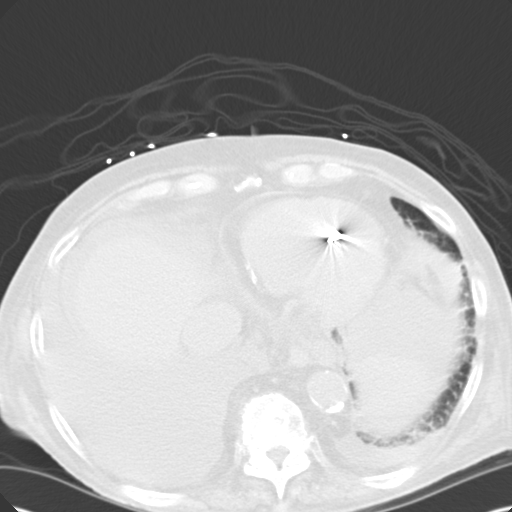
[im 85/93  lung]
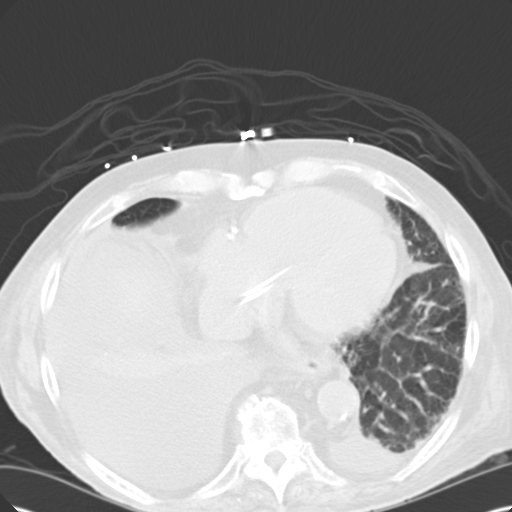
[im 89/93  soft-tissue]
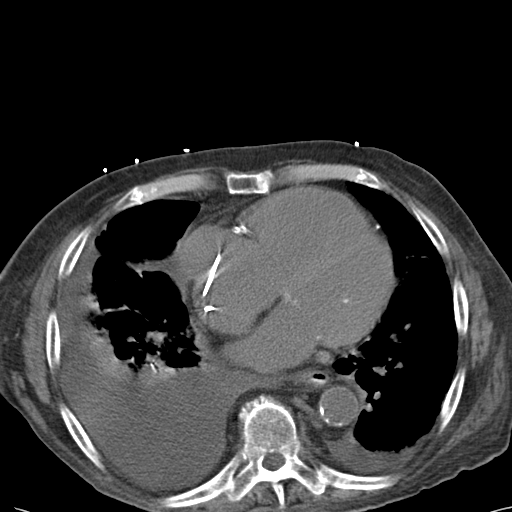
[im 89/93  lung]
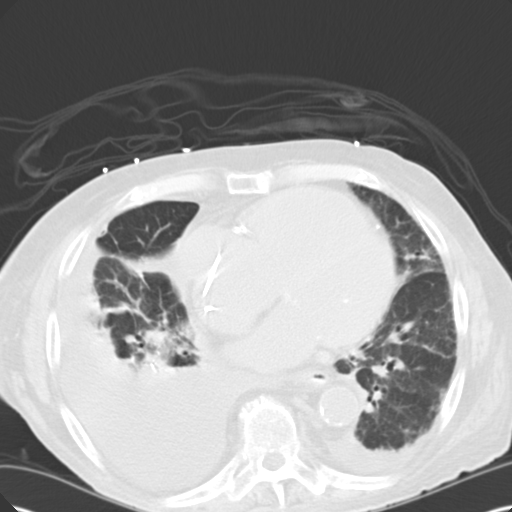

[15 of 32 positions shown; findings below may reference images not displayed]

FINDINGS: Moderate to moderately large right pleural effusion is present.
There is a small left pleural effusion. Cardiomegaly is noted.
Calcific aortic and coronary atherosclerosis is seen.

A small volume of abdominal and pelvic ascites is present.
Gallstones are noted without evidence of cholecystitis. The liver,
spleen, adrenal glands, pancreas and left kidney are unremarkable. A
small cyst off the lower pole of the right kidney is noted.

The patient has extensive aortoiliac atherosclerosis without
aneurysm. Diverticulosis of the colon is worst in the sigmoid. The
transverse colon is decompressed but otherwise unremarkable. The
stomach is also largely decompressed. Small bowel loops appear
normal. Body wall edema is identified. No focal fluid collection is
seen. Small fat containing inguinal hernias are identified. There is
some ascitic fluid in the left hernia.

No lytic or sclerotic bony lesion is seen. The patient is status
post left hip replacement.
IMPRESSION: Right greater than left pleural effusions, small to moderate volume
of abdominal pelvic ascites and body wall edema and compatible with
anasarca. Cause is not identified.

Extensive atherosclerotic vascular disease without aneurysm.

Mild cardiomegaly.

Gallstones without evidence of cholecystitis.

Diverticulosis without diverticulitis.
